# Patient Record
Sex: Female | Born: 1990 | Race: Black or African American | Hispanic: No | Marital: Single | Smoking: Former smoker
Health system: Southern US, Community
[De-identification: ages and names within clinical notes are randomized; demographics above are authoritative.]

## PROBLEM LIST (undated history)

## (undated) ENCOUNTER — Inpatient Hospital Stay (HOSPITAL_COMMUNITY): Payer: Self-pay

## (undated) ENCOUNTER — Inpatient Hospital Stay (HOSPITAL_COMMUNITY): Payer: Medicaid Other

## (undated) DIAGNOSIS — Z8711 Personal history of peptic ulcer disease: Secondary | ICD-10-CM

## (undated) DIAGNOSIS — Z789 Other specified health status: Secondary | ICD-10-CM

## (undated) DIAGNOSIS — N856 Intrauterine synechiae: Secondary | ICD-10-CM

## (undated) DIAGNOSIS — IMO0002 Reserved for concepts with insufficient information to code with codable children: Secondary | ICD-10-CM

## (undated) DIAGNOSIS — O1490 Unspecified pre-eclampsia, unspecified trimester: Secondary | ICD-10-CM

## (undated) DIAGNOSIS — B009 Herpesviral infection, unspecified: Secondary | ICD-10-CM

## (undated) DIAGNOSIS — Z8719 Personal history of other diseases of the digestive system: Secondary | ICD-10-CM

## (undated) DIAGNOSIS — R87619 Unspecified abnormal cytological findings in specimens from cervix uteri: Secondary | ICD-10-CM

## (undated) HISTORY — PX: NO PAST SURGERIES: SHX2092

## (undated) HISTORY — PX: WISDOM TOOTH EXTRACTION: SHX21

## (undated) HISTORY — PX: TOOTH EXTRACTION: SUR596

---
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
## 2008-06-05 ENCOUNTER — Emergency Department (HOSPITAL_COMMUNITY): Admission: EM | Admit: 2008-06-05 | Discharge: 2008-06-05 | Payer: Self-pay | Admitting: *Deleted

## 2008-07-10 ENCOUNTER — Emergency Department (HOSPITAL_COMMUNITY): Admission: EM | Admit: 2008-07-10 | Discharge: 2008-07-10 | Payer: Self-pay | Admitting: Emergency Medicine

## 2008-12-04 ENCOUNTER — Emergency Department (HOSPITAL_COMMUNITY): Admission: EM | Admit: 2008-12-04 | Discharge: 2008-12-04 | Payer: Self-pay | Admitting: Emergency Medicine

## 2008-12-27 ENCOUNTER — Emergency Department (HOSPITAL_COMMUNITY): Admission: EM | Admit: 2008-12-27 | Discharge: 2008-12-28 | Payer: Self-pay | Admitting: Emergency Medicine

## 2009-01-20 ENCOUNTER — Emergency Department (HOSPITAL_COMMUNITY): Admission: EM | Admit: 2009-01-20 | Discharge: 2009-01-20 | Payer: Self-pay | Admitting: Family Medicine

## 2009-11-13 ENCOUNTER — Emergency Department (HOSPITAL_COMMUNITY): Admission: EM | Admit: 2009-11-13 | Discharge: 2009-11-13 | Payer: Self-pay | Admitting: Emergency Medicine

## 2010-04-22 ENCOUNTER — Emergency Department (HOSPITAL_COMMUNITY): Admission: EM | Admit: 2010-04-22 | Discharge: 2010-04-22 | Payer: Self-pay | Admitting: Emergency Medicine

## 2010-08-01 ENCOUNTER — Emergency Department (HOSPITAL_COMMUNITY)
Admission: EM | Admit: 2010-08-01 | Discharge: 2010-08-02 | Disposition: A | Discharge status: Home / Self Care | Payer: Self-pay | Attending: Emergency Medicine | Admitting: Emergency Medicine

## 2010-08-01 DIAGNOSIS — Z0389 Encounter for observation for other suspected diseases and conditions ruled out: Secondary | ICD-10-CM | POA: Insufficient documentation

## 2010-08-07 LAB — COMPREHENSIVE METABOLIC PANEL
ALT: 15 U/L (ref 0–35)
AST: 21 U/L (ref 0–37)
Albumin: 4 g/dL (ref 3.5–5.2)
Alkaline Phosphatase: 52 U/L (ref 39–117)
BUN: 12 mg/dL (ref 6–23)
CO2: 23 mEq/L (ref 19–32)
Calcium: 9.2 mg/dL (ref 8.4–10.5)
Chloride: 108 mEq/L (ref 96–112)
Creatinine, Ser: 0.61 mg/dL (ref 0.4–1.2)
GFR calc Af Amer: >60 mL/min (ref >60)
GFR calc non Af Amer: >60 mL/min (ref >60)
Glucose, Bld: 79 mg/dL (ref 70–99)
Potassium: 3.6 mEq/L (ref 3.5–5.1)
Sodium: 139 mEq/L (ref 135–145)
Total Bilirubin: 0.5 mg/dL (ref 0.3–1.2)
Total Protein: 7.2 g/dL (ref 6.0–8.3)

## 2010-08-07 LAB — CBC
HCT: 39.5 % (ref 36.0–46.0)
Hemoglobin: 13.3 g/dL (ref 12.0–15.0)
MCH: 29.2 pg (ref 26.0–34.0)
MCHC: 33.7 g/dL (ref 30.0–36.0)
MCV: 86.8 fL (ref 78.0–100.0)
Platelets: 270 10*3/uL (ref 150–400)
RBC: 4.55 MIL/uL (ref 3.87–5.11)
RDW: 12.3 % (ref 11.5–15.5)
WBC: 7 10*3/uL (ref 4.0–10.5)

## 2010-08-07 LAB — DIFFERENTIAL
Basophils Absolute: 0.1 10*3/uL (ref 0.0–0.1)
Basophils Relative: 2 % — ABNORMAL HIGH (ref 0–1)
Eosinophils Absolute: 0.1 10*3/uL (ref 0.0–0.7)
Eosinophils Relative: 1 % (ref 0–5)
Lymphocytes Relative: 34 % (ref 12–46)
Lymphs Abs: 2.4 10*3/uL (ref 0.7–4.0)
Monocytes Absolute: 0.5 10*3/uL (ref 0.1–1.0)
Monocytes Relative: 7 % (ref 3–12)
Neutro Abs: 3.9 10*3/uL (ref 1.7–7.7)
Neutrophils Relative %: 56 % (ref 43–77)

## 2010-08-07 LAB — LIPASE, BLOOD: Lipase: 27 U/L (ref 11–59)

## 2010-08-07 LAB — URINALYSIS, ROUTINE W REFLEX MICROSCOPIC
Bilirubin Urine: NEGATIVE
Glucose, UA: NEGATIVE mg/dL
Hgb urine dipstick: NEGATIVE
Ketones, ur: 15 mg/dL — AB
Nitrite: NEGATIVE
Protein, ur: NEGATIVE mg/dL
Specific Gravity, Urine: 1.03 (ref 1.005–1.030)
Urobilinogen, UA: 1 mg/dL (ref 0.0–1.0)
pH: 6 (ref 5.0–8.0)

## 2010-08-07 LAB — POCT PREGNANCY, URINE: Preg Test, Ur: NEGATIVE

## 2010-09-01 LAB — COMPREHENSIVE METABOLIC PANEL
ALT: 14 U/L (ref 0–35)
AST: 17 U/L (ref 0–37)
Albumin: 3.9 g/dL (ref 3.5–5.2)
Alkaline Phosphatase: 50 U/L (ref 39–117)
BUN: 10 mg/dL (ref 6–23)
CO2: 25 mEq/L (ref 19–32)
Calcium: 8.7 mg/dL (ref 8.4–10.5)
Chloride: 107 mEq/L (ref 96–112)
Creatinine, Ser: 0.63 mg/dL (ref 0.4–1.2)
GFR calc Af Amer: >60 mL/min (ref >60)
GFR calc non Af Amer: >60 mL/min (ref >60)
Glucose, Bld: 92 mg/dL (ref 70–99)
Potassium: 3.9 mEq/L (ref 3.5–5.1)
Sodium: 136 mEq/L (ref 135–145)
Total Bilirubin: 0.6 mg/dL (ref 0.3–1.2)
Total Protein: 6.5 g/dL (ref 6.0–8.3)

## 2010-09-01 LAB — DIFFERENTIAL
Basophils Absolute: 0.1 10*3/uL (ref 0.0–0.1)
Basophils Relative: 1 % (ref 0–1)
Eosinophils Absolute: 0.3 10*3/uL (ref 0.0–0.7)
Eosinophils Relative: 4 % (ref 0–5)
Lymphocytes Relative: 50 % — ABNORMAL HIGH (ref 12–46)
Lymphs Abs: 3.3 10*3/uL (ref 0.7–4.0)
Monocytes Absolute: 0.6 10*3/uL (ref 0.1–1.0)
Monocytes Relative: 9 % (ref 3–12)
Neutro Abs: 2.4 10*3/uL (ref 1.7–7.7)
Neutrophils Relative %: 36 % — ABNORMAL LOW (ref 43–77)

## 2010-09-01 LAB — POCT URINALYSIS DIP (DEVICE)
Bilirubin Urine: NEGATIVE
Glucose, UA: NEGATIVE mg/dL
Ketones, ur: NEGATIVE mg/dL
Nitrite: NEGATIVE
Protein, ur: NEGATIVE mg/dL
Specific Gravity, Urine: 1.02 (ref 1.005–1.030)
Urobilinogen, UA: 0.2 mg/dL (ref 0.0–1.0)
pH: 6.5 (ref 5.0–8.0)

## 2010-09-01 LAB — POCT I-STAT, CHEM 8
BUN: 12 mg/dL (ref 6–23)
Calcium, Ion: 1.26 mmol/L (ref 1.12–1.32)
Chloride: 102 mEq/L (ref 96–112)
Creatinine, Ser: 0.7 mg/dL (ref 0.4–1.2)
Glucose, Bld: 90 mg/dL (ref 70–99)
HCT: 41 % (ref 36.0–46.0)
Hemoglobin: 13.9 g/dL (ref 12.0–15.0)
Potassium: 3.4 mEq/L — ABNORMAL LOW (ref 3.5–5.1)
Sodium: 142 mEq/L (ref 135–145)
TCO2: 26 mmol/L (ref 0–100)

## 2010-09-01 LAB — CBC
HCT: 37.2 % (ref 36.0–46.0)
Hemoglobin: 12.4 g/dL (ref 12.0–15.0)
MCHC: 33.2 g/dL (ref 30.0–36.0)
MCV: 87.9 fL (ref 78.0–100.0)
Platelets: 241 10*3/uL (ref 150–400)
RBC: 4.24 MIL/uL (ref 3.87–5.11)
RDW: 12.1 % (ref 11.5–15.5)
WBC: 6.7 10*3/uL (ref 4.0–10.5)

## 2010-09-01 LAB — URINALYSIS, ROUTINE W REFLEX MICROSCOPIC
Bilirubin Urine: NEGATIVE
Glucose, UA: NEGATIVE mg/dL
Hgb urine dipstick: NEGATIVE
Ketones, ur: NEGATIVE mg/dL
Nitrite: NEGATIVE
Protein, ur: NEGATIVE mg/dL
Specific Gravity, Urine: 1.025 (ref 1.005–1.030)
Urobilinogen, UA: 1 mg/dL (ref 0.0–1.0)
pH: 7.5 (ref 5.0–8.0)

## 2010-09-01 LAB — POCT PREGNANCY, URINE
Preg Test, Ur: NEGATIVE
Preg Test, Ur: NEGATIVE

## 2010-09-01 LAB — LIPASE, BLOOD: Lipase: 28 U/L (ref 11–59)

## 2010-09-02 LAB — DIFFERENTIAL
Basophils Absolute: 0.1 10*3/uL (ref 0.0–0.1)
Basophils Relative: 1 % (ref 0–1)
Eosinophils Absolute: 0.2 10*3/uL (ref 0.0–0.7)
Eosinophils Relative: 5 % (ref 0–5)
Lymphocytes Relative: 36 % (ref 12–46)
Lymphs Abs: 1.7 10*3/uL (ref 0.7–4.0)
Monocytes Absolute: 0.4 10*3/uL (ref 0.1–1.0)
Monocytes Relative: 8 % (ref 3–12)
Neutro Abs: 2.4 10*3/uL (ref 1.7–7.7)
Neutrophils Relative %: 50 % (ref 43–77)

## 2010-09-02 LAB — COMPREHENSIVE METABOLIC PANEL
ALT: 17 U/L (ref 0–35)
AST: 23 U/L (ref 0–37)
Albumin: 3.5 g/dL (ref 3.5–5.2)
Alkaline Phosphatase: 49 U/L (ref 39–117)
BUN: 9 mg/dL (ref 6–23)
CO2: 25 mEq/L (ref 19–32)
Calcium: 8.9 mg/dL (ref 8.4–10.5)
Chloride: 109 mEq/L (ref 96–112)
Creatinine, Ser: 0.69 mg/dL (ref 0.4–1.2)
GFR calc Af Amer: >60 mL/min (ref >60)
GFR calc non Af Amer: >60 mL/min (ref >60)
Glucose, Bld: 110 mg/dL — ABNORMAL HIGH (ref 70–99)
Potassium: 3.5 mEq/L (ref 3.5–5.1)
Sodium: 141 mEq/L (ref 135–145)
Total Bilirubin: 0.7 mg/dL (ref 0.3–1.2)
Total Protein: 6.3 g/dL (ref 6.0–8.3)

## 2010-09-02 LAB — CBC
HCT: 38.4 % (ref 36.0–46.0)
Hemoglobin: 13 g/dL (ref 12.0–15.0)
MCHC: 33.9 g/dL (ref 30.0–36.0)
MCV: 87.1 fL (ref 78.0–100.0)
Platelets: 232 10*3/uL (ref 150–400)
RBC: 4.41 MIL/uL (ref 3.87–5.11)
RDW: 12.7 % (ref 11.5–15.5)
WBC: 4.8 10*3/uL (ref 4.0–10.5)

## 2010-09-02 LAB — HCG, SERUM, QUALITATIVE: Preg, Serum: NEGATIVE

## 2010-09-02 LAB — LIPASE, BLOOD: Lipase: 27 U/L (ref 11–59)

## 2010-09-10 LAB — URINALYSIS, ROUTINE W REFLEX MICROSCOPIC
Glucose, UA: NEGATIVE mg/dL
Ketones, ur: NEGATIVE mg/dL
Nitrite: NEGATIVE
Protein, ur: NEGATIVE mg/dL
Urobilinogen, UA: 1 mg/dL (ref 0.0–1.0)

## 2010-09-10 LAB — RAPID URINE DRUG SCREEN, HOSP PERFORMED
Benzodiazepines: NOT DETECTED
Cocaine: NOT DETECTED
Opiates: NOT DETECTED
Tetrahydrocannabinol: NOT DETECTED

## 2010-09-10 LAB — POCT PREGNANCY, URINE: Preg Test, Ur: NEGATIVE

## 2010-09-10 LAB — URINE MICROSCOPIC-ADD ON

## 2010-09-11 LAB — RAPID URINE DRUG SCREEN, HOSP PERFORMED
Cocaine: NOT DETECTED
Tetrahydrocannabinol: NOT DETECTED

## 2010-10-18 ENCOUNTER — Inpatient Hospital Stay (INDEPENDENT_AMBULATORY_CARE_PROVIDER_SITE_OTHER)
Admission: RE | Admit: 2010-10-18 | Discharge: 2010-10-18 | Disposition: A | Discharge status: Home / Self Care | Payer: Self-pay | Source: Ambulatory Visit | Attending: Emergency Medicine | Admitting: Emergency Medicine

## 2010-10-18 DIAGNOSIS — IMO0002 Reserved for concepts with insufficient information to code with codable children: Secondary | ICD-10-CM

## 2010-12-09 ENCOUNTER — Emergency Department (HOSPITAL_COMMUNITY): Payer: Self-pay

## 2010-12-09 ENCOUNTER — Emergency Department (HOSPITAL_COMMUNITY)
Admission: EM | Admit: 2010-12-09 | Discharge: 2010-12-10 | Disposition: A | Discharge status: Home / Self Care | Payer: Self-pay | Attending: Emergency Medicine | Admitting: Emergency Medicine

## 2010-12-09 DIAGNOSIS — R059 Cough, unspecified: Secondary | ICD-10-CM | POA: Insufficient documentation

## 2010-12-09 DIAGNOSIS — R05 Cough: Secondary | ICD-10-CM | POA: Insufficient documentation

## 2010-12-09 DIAGNOSIS — R079 Chest pain, unspecified: Secondary | ICD-10-CM | POA: Insufficient documentation

## 2010-12-09 DIAGNOSIS — R5381 Other malaise: Secondary | ICD-10-CM | POA: Insufficient documentation

## 2010-12-09 DIAGNOSIS — R0602 Shortness of breath: Secondary | ICD-10-CM | POA: Insufficient documentation

## 2010-12-09 DIAGNOSIS — R112 Nausea with vomiting, unspecified: Secondary | ICD-10-CM | POA: Insufficient documentation

## 2010-12-09 DIAGNOSIS — R300 Dysuria: Secondary | ICD-10-CM | POA: Insufficient documentation

## 2010-12-09 DIAGNOSIS — N390 Urinary tract infection, site not specified: Secondary | ICD-10-CM | POA: Insufficient documentation

## 2010-12-09 LAB — POCT I-STAT, CHEM 8
BUN: 13 mg/dL (ref 6–23)
Hemoglobin: 13.6 g/dL (ref 12.0–15.0)
Potassium: 4.3 mEq/L (ref 3.5–5.1)
Sodium: 140 mEq/L (ref 135–145)
TCO2: 24 mmol/L (ref 0–100)

## 2010-12-09 LAB — POCT PREGNANCY, URINE: Preg Test, Ur: NEGATIVE

## 2010-12-10 LAB — URINALYSIS, ROUTINE W REFLEX MICROSCOPIC
Ketones, ur: NEGATIVE mg/dL
Nitrite: NEGATIVE
Protein, ur: NEGATIVE mg/dL
pH: 7.5 (ref 5.0–8.0)

## 2011-03-27 ENCOUNTER — Emergency Department (HOSPITAL_COMMUNITY)
Admission: EM | Admit: 2011-03-27 | Discharge: 2011-03-27 | Disposition: A | Discharge status: Home / Self Care | Payer: Self-pay | Attending: Emergency Medicine | Admitting: Emergency Medicine

## 2011-03-27 DIAGNOSIS — B9689 Other specified bacterial agents as the cause of diseases classified elsewhere: Secondary | ICD-10-CM | POA: Insufficient documentation

## 2011-03-27 DIAGNOSIS — R109 Unspecified abdominal pain: Secondary | ICD-10-CM | POA: Insufficient documentation

## 2011-03-27 DIAGNOSIS — R10819 Abdominal tenderness, unspecified site: Secondary | ICD-10-CM | POA: Insufficient documentation

## 2011-03-27 DIAGNOSIS — N760 Acute vaginitis: Secondary | ICD-10-CM | POA: Insufficient documentation

## 2011-03-27 DIAGNOSIS — A499 Bacterial infection, unspecified: Secondary | ICD-10-CM | POA: Insufficient documentation

## 2011-03-27 LAB — WET PREP, GENITAL
Trich, Wet Prep: NONE SEEN
Yeast Wet Prep HPF POC: NONE SEEN

## 2011-03-27 LAB — URINALYSIS, ROUTINE W REFLEX MICROSCOPIC
Glucose, UA: NEGATIVE mg/dL
Hgb urine dipstick: NEGATIVE
Specific Gravity, Urine: 1.031 — ABNORMAL HIGH (ref 1.005–1.030)
pH: 6 (ref 5.0–8.0)

## 2011-03-27 LAB — POCT I-STAT, CHEM 8
Creatinine, Ser: 0.7 mg/dL (ref 0.50–1.10)
Hemoglobin: 14.3 g/dL (ref 12.0–15.0)
Sodium: 140 mEq/L (ref 135–145)
TCO2: 22 mmol/L (ref 0–100)

## 2011-03-28 LAB — GC/CHLAMYDIA PROBE AMP, GENITAL
Chlamydia, DNA Probe: NEGATIVE
GC Probe Amp, Genital: NEGATIVE

## 2011-05-10 ENCOUNTER — Inpatient Hospital Stay (HOSPITAL_COMMUNITY): Payer: Medicaid Other

## 2011-05-10 ENCOUNTER — Inpatient Hospital Stay (HOSPITAL_COMMUNITY)
Admission: AD | Admit: 2011-05-10 | Discharge: 2011-05-10 | Disposition: A | Discharge status: Home / Self Care | Payer: Medicaid Other | Source: Ambulatory Visit | Attending: Obstetrics & Gynecology | Admitting: Obstetrics & Gynecology

## 2011-05-10 ENCOUNTER — Encounter (HOSPITAL_COMMUNITY): Payer: Self-pay | Admitting: *Deleted

## 2011-05-10 DIAGNOSIS — R102 Pelvic and perineal pain: Secondary | ICD-10-CM

## 2011-05-10 DIAGNOSIS — R109 Unspecified abdominal pain: Secondary | ICD-10-CM | POA: Insufficient documentation

## 2011-05-10 DIAGNOSIS — O26899 Other specified pregnancy related conditions, unspecified trimester: Secondary | ICD-10-CM

## 2011-05-10 DIAGNOSIS — N390 Urinary tract infection, site not specified: Secondary | ICD-10-CM

## 2011-05-10 DIAGNOSIS — N949 Unspecified condition associated with female genital organs and menstrual cycle: Secondary | ICD-10-CM

## 2011-05-10 DIAGNOSIS — O2390 Unspecified genitourinary tract infection in pregnancy, unspecified trimester: Secondary | ICD-10-CM

## 2011-05-10 DIAGNOSIS — O2340 Unspecified infection of urinary tract in pregnancy, unspecified trimester: Secondary | ICD-10-CM

## 2011-05-10 LAB — WET PREP, GENITAL: Trich, Wet Prep: NONE SEEN

## 2011-05-10 LAB — URINALYSIS, ROUTINE W REFLEX MICROSCOPIC
Bilirubin Urine: NEGATIVE
Ketones, ur: NEGATIVE mg/dL
Nitrite: POSITIVE — AB
pH: 7 (ref 5.0–8.0)

## 2011-05-10 LAB — CBC
Hemoglobin: 12.1 g/dL (ref 12.0–15.0)
MCH: 29.4 pg (ref 26.0–34.0)
MCV: 85.2 fL (ref 78.0–100.0)
RBC: 4.12 MIL/uL (ref 3.87–5.11)
WBC: 8.6 10*3/uL (ref 4.0–10.5)

## 2011-05-10 LAB — HCG, QUANTITATIVE, PREGNANCY: hCG, Beta Chain, Quant, S: 63137 m[IU]/mL — ABNORMAL HIGH (ref <5)

## 2011-05-10 LAB — URINE MICROSCOPIC-ADD ON

## 2011-05-10 MED ORDER — SULFAMETHOXAZOLE-TRIMETHOPRIM 800-160 MG PO TABS: 1.0000 | ORAL_TABLET | Freq: Two times a day (BID) | ORAL | Status: AC

## 2011-05-10 MED ORDER — SULFAMETHOXAZOLE-TRIMETHOPRIM 800-160 MG PO TABS: 1.0000 | ORAL_TABLET | Freq: Two times a day (BID) | ORAL | Status: DC

## 2011-05-10 NOTE — ED Notes (Signed)
Called, not in lobby 

## 2011-05-10 NOTE — ED Provider Notes (Signed)
History     Chief Complaint  Patient presents with  . Abdominal Pain  . Emesis   HPI Traci King 20 y.o. Comes to MAU with vomiting and lower abdominal pain.  Has not started prenatal care.  No vomiting in past 4 hours.  OB History    Grav Para Term Preterm Abortions TAB SAB Ect Mult Living   1               Past Medical History  Diagnosis Date  . No pertinent past medical history     Past Surgical History  Procedure Date  . No past surgeries     No family history on file.  History  Substance Use Topics  . Smoking status: Current Everyday Smoker -- 0.2 packs/day  . Smokeless tobacco: Not on file  . Alcohol Use: No    Allergies: No Known Allergies  Prescriptions prior to admission  Medication Sig Dispense Refill  . prenatal vitamin w/FE, FA (PRENATAL 1 + 1) 27-1 MG TABS Take 1 tablet by mouth daily.          Review of Systems  Gastrointestinal: Positive for nausea, vomiting and abdominal pain.  Genitourinary: Negative for dysuria.   Physical Exam   Temperature 99 F (37.2 C), temperature source Oral, height 5' 2.5" (1.588 m), weight 126 lb 2 oz (57.21 kg), last menstrual period 03/10/2011.  Physical Exam  Nursing note and vitals reviewed. Constitutional: She is oriented to person, place, and time. She appears well-developed and well-nourished.  HENT:  Head: Normocephalic.  Eyes: EOM are normal.  Neck: Neck supple.  GI: Soft. There is no tenderness. There is no rebound and no guarding.  Genitourinary:       Speculum exam: Vulva - discharge noted Vagina - Small amount of creamy discharge, no odor Cervix - No contact bleeding Bimanual exam: Cervix closed Uterus tender, retroverted, firm Adnexa some tenderness on the right, no masses bilaterally GC/Chlam, wet prep done Chaperone present for exam.  Musculoskeletal: Normal range of motion.  Neurological: She is alert and oriented to person, place, and time.  Skin: Skin is warm and dry.    Psychiatric: She has a normal mood and affect.    MAU Course  Procedures  MDM OBSTETRIC <14 WK [place] AND TRANSVAGINAL OB US  Technique: Both transabdominal and transvaginal ultrasound  examinations were performed for complete evaluation of the  gestation as well as the maternal uterus, adnexal regions, and  pelvic cul-de-sac. Transvaginal technique was performed to assess  early pregnancy.  Comparison: None.  Intrauterine gestational sac: Visualized, normal in shape.  Yolk sac: Identified  Embryo: Identified  Cardiac Activity: Documented  Heart Rate: 116 bpm  CRL: 7.4 mm 6 w 4 d [place] EDC: 12/30/2011  Maternal uterus/adnexae:  Small amount of simple free fluid. Ovaries are normal in size,  measuring 4.2 x 2.3 x 2.1 cm on the left and 3.3 x 1.7 x 1.7 cm on  the right. A 2.8 x 2.1 cm simple cyst is noted on the left.  IMPRESSION:  Single intrauterine gestation identified with cardiac activity  documented. Estimated gestational age of [redacted] weeks 4 days by crown-  rump length.   Results for orders placed during the hospital encounter of 05/10/11 (from the past 24 hour(s))  URINALYSIS, ROUTINE W REFLEX MICROSCOPIC     Status: Abnormal   Collection Time   05/10/11  6:25 PM      Component Value Range   Color, Urine YELLOW  YELLOW  APPearance CLEAR  CLEAR    Specific Gravity, Urine 1.015  1.005 - 1.030    pH 7.0  5.0 - 8.0    Glucose, UA NEGATIVE  NEGATIVE (mg/dL)   Hgb urine dipstick NEGATIVE  NEGATIVE    Bilirubin Urine NEGATIVE  NEGATIVE    Ketones, ur NEGATIVE  NEGATIVE (mg/dL)   Protein, ur NEGATIVE  NEGATIVE (mg/dL)   Urobilinogen, UA 0.2  0.0 - 1.0 (mg/dL)   Nitrite POSITIVE (*) NEGATIVE    Leukocytes, UA SMALL (*) NEGATIVE   URINE MICROSCOPIC-ADD ON     Status: Abnormal   Collection Time   05/10/11  6:25 PM      Component Value Range   Squamous Epithelial / LPF FEW (*) RARE    WBC, UA 11-20  <3 (WBC/hpf)   RBC / HPF 3-6  <3 (RBC/hpf)   Bacteria, UA MANY (*) RARE    POCT PREGNANCY, URINE     Status: Normal   Collection Time   05/10/11  7:04 PM      Component Value Range   Preg Test, Ur POSITIVE    CBC     Status: Abnormal   Collection Time   05/10/11  7:51 PM      Component Value Range   WBC 8.6  4.0 - 10.5 (K/uL)   RBC 4.12  3.87 - 5.11 (MIL/uL)   Hemoglobin 12.1  12.0 - 15.0 (g/dL)   HCT 40.9 (*) 81.1 - 46.0 (%)   MCV 85.2  78.0 - 100.0 (fL)   MCH 29.4  26.0 - 34.0 (pg)   MCHC 34.5  30.0 - 36.0 (g/dL)   RDW 91.4  78.2 - 95.6 (%)   Platelets 229  150 - 400 (K/uL)  WET PREP, GENITAL     Status: Abnormal   Collection Time   05/10/11  8:00 PM      Component Value Range   Yeast, Wet Prep NONE SEEN  NONE SEEN    Trich, Wet Prep NONE SEEN  NONE SEEN    Clue Cells, Wet Prep FEW (*) NONE SEEN    WBC, Wet Prep HPF POC FEW (*) NONE SEEN   ABO/RH     Status: Normal   Collection Time   05/10/11  8:01 PM      Component Value Range   ABO/RH(D) O POS    HCG, QUANTITATIVE, PREGNANCY     Status: Abnormal   Collection Time   05/10/11  8:02 PM      Component Value Range   hCG, Beta Chain, Quant, S 21308 (*) <5 (mIU/mL)     Assessment and Plan  UTI in pregnancy  Plan rx septra DS one po bid x 7 days.   Begin prenatal care as soon as possible.   Sakshi Sermons 05/10/2011, 9:07 PM   Nolene Bernheim, NP 05/10/11 2242

## 2011-05-10 NOTE — Progress Notes (Signed)
Pt states she is having lower abdominal pain that started this am. No vaginal bleeding, white odorous discharge.

## 2011-05-10 NOTE — Progress Notes (Signed)
Pt states, " I woke up this morning vomiting. I am throwing up every five minutes.I went to [facility] and they sent me here."

## 2011-05-11 LAB — GC/CHLAMYDIA PROBE AMP, GENITAL: GC Probe Amp, Genital: NEGATIVE

## 2011-05-11 NOTE — ED Provider Notes (Signed)
Attestation of Attending Supervision of Advanced Practitioner: Evaluation and management procedures were performed by the PA/NP/CNM/OB Fellow under my supervision/collaboration. Chart reviewed, and agree with management and plan.  UGONNA ANYANWU, M.D. 05/11/2011 5:41 AM   

## 2011-05-13 LAB — URINE CULTURE
Culture  Setup Time: 201212151150
Special Requests: NORMAL

## 2011-05-28 NOTE — L&D Delivery Note (Signed)
Delivery Note At 5:40 PM a viable unspecified sex was delivered via Vaginal, Spontaneous Delivery (Presentation: ; Occiput Anterior).  APGAR: , ; weight .   Placenta status: Intact, Spontaneous.  Cord:  with the following complications: .  Cord pH: not done  Anesthesia: Epidural  Episiotomy: None Lacerations: None Suture Repair: 2.0 Est. Blood Loss (mL): 250  Mom to postpartum.  Baby to nursery-stable.  Ab Leaming A 11/30/2011, 5:47 PM

## 2011-05-31 LAB — OB RESULTS CONSOLE RUBELLA ANTIBODY, IGM: Rubella: IMMUNE

## 2011-06-28 ENCOUNTER — Inpatient Hospital Stay (HOSPITAL_COMMUNITY)
Admission: AD | Admit: 2011-06-28 | Discharge: 2011-06-29 | Disposition: A | Discharge status: Home / Self Care | Payer: Medicaid Other | Source: Ambulatory Visit | Attending: Obstetrics | Admitting: Obstetrics

## 2011-06-28 DIAGNOSIS — J069 Acute upper respiratory infection, unspecified: Secondary | ICD-10-CM

## 2011-06-28 DIAGNOSIS — B373 Candidiasis of vulva and vagina: Secondary | ICD-10-CM

## 2011-06-28 DIAGNOSIS — O99891 Other specified diseases and conditions complicating pregnancy: Secondary | ICD-10-CM | POA: Insufficient documentation

## 2011-06-28 LAB — URINALYSIS, ROUTINE W REFLEX MICROSCOPIC
Bilirubin Urine: NEGATIVE
Hgb urine dipstick: NEGATIVE
Ketones, ur: 15 mg/dL — AB
Nitrite: NEGATIVE
Specific Gravity, Urine: 1.02 (ref 1.005–1.030)
Urobilinogen, UA: 0.2 mg/dL (ref 0.0–1.0)
pH: 6 (ref 5.0–8.0)

## 2011-06-28 NOTE — Progress Notes (Signed)
G1 at 15wks. States having trouble breathing. Nose stopped up and was gasping for air and couldn't get breath thru mouth. Went to [facility] and saw brownish/red d/c on toilet paper. Has had upper respiratory symptoms for 3 days

## 2011-06-29 ENCOUNTER — Encounter (HOSPITAL_COMMUNITY): Payer: Self-pay | Admitting: Obstetrics and Gynecology

## 2011-06-29 LAB — WET PREP, GENITAL
Clue Cells Wet Prep HPF POC: NONE SEEN
Yeast Wet Prep HPF POC: NONE SEEN

## 2011-06-29 LAB — GC/CHLAMYDIA PROBE AMP, GENITAL: Chlamydia, DNA Probe: NEGATIVE

## 2011-06-29 MED ORDER — PSEUDOEPHEDRINE HCL 30 MG PO TABS
30.0000 mg | ORAL_TABLET | ORAL | Status: DC | PRN
Start: 2011-06-29 — End: 2011-06-29

## 2011-06-29 MED ORDER — PSEUDOEPHEDRINE HCL 30 MG PO TABS: 30.0000 mg | ORAL_TABLET | ORAL | Status: AC | PRN

## 2011-06-29 MED ORDER — TERCONAZOLE 0.4 % VA CREA: 1.0000 | TOPICAL_CREAM | Freq: Every day | VAGINAL | Status: AC

## 2011-06-29 MED ORDER — PSEUDOEPHEDRINE HCL 30 MG PO TABS
60.0000 mg | ORAL_TABLET | Freq: Once | ORAL | Status: AC
  Administered 2011-06-29: 60 mg via ORAL
  Filled 2011-06-29 (×2): qty 1

## 2011-06-29 NOTE — ED Provider Notes (Signed)
History     Chief Complaint  Patient presents with  . Vaginal Bleeding  . Abdominal Pain  . URI   HPI Traci King  21 y.o. 14w 6d gestation.  Comes to MAU tonight with congested nose, some difficulty breathing and noticing some blood when she wipes.  She awakened after sleeping with nose completely blocked and then sneezed and had congestion in her throat as well.  Thought she could not breathe but did not become dizzy or pass out.  Having some bothersome vaginal discharge.  Felt some leak out when she sneezed.   OB History    Grav Para Term Preterm Abortions TAB SAB Ect Mult Living   1               Past Medical History  Diagnosis Date  . No pertinent past medical history     Past Surgical History  Procedure Date  . No past surgeries     History reviewed. No pertinent family history.  History  Substance Use Topics  . Smoking status: Current Everyday Smoker -- 0.2 packs/day  . Smokeless tobacco: Not on file  . Alcohol Use: No    Allergies: No Known Allergies  Prescriptions prior to admission  Medication Sig Dispense Refill  . acetaminophen-codeine (TYLENOL #3) 300-30 MG per tablet Take 0.5 tablets by mouth every 4 (four) hours as needed. pain      . Ondansetron (ZOFRAN ODT PO) Take 1 tablet by mouth every 4 (four) hours as needed. Nausea; pt does not know if this ODT is 4mg  or 8mg       . Prenatal Vit-Fe Fumarate-FA (PRENATAL MULTIVITAMIN) TABS Take 1 tablet by mouth daily.        Review of Systems  HENT: Positive for congestion and sore throat.   Gastrointestinal: Negative for nausea, vomiting and abdominal pain.  Genitourinary: Negative for dysuria.       Saw blood when wiping   Physical Exam   Blood pressure 103/63, pulse 74, temperature 98.6 F (37 C), temperature source Oral, resp. rate 20, height 5\' 2"  (1.575 m), weight 131 lb (59.421 kg), last menstrual period 03/10/2011.  Physical Exam  Nursing note and vitals reviewed. Constitutional: She is  oriented to person, place, and time. She appears well-developed and well-nourished.  HENT:  Head: Normocephalic.  Eyes: EOM are normal.  Neck: Neck supple.  Cardiovascular: Normal rate and regular rhythm.   No murmur heard. Respiratory: Effort normal and breath sounds normal. No respiratory distress. She has no wheezes.  GI: Soft. There is no tenderness. There is no rebound and no guarding.  Genitourinary:       Speculum exam: Vulva - mild erythema, small fissure at 6 oclock, no blood Vagina - some erythema, Small amount of curdy adherent discharge, no odor, no blood in vagina Cervix - No contact bleeding Bimanual exam: Cervix closed Uterus non tender, 15 week size Adnexa non tender, no masses bilaterally GC/Chlam, wet prep done Chaperone present for exam.  Musculoskeletal: Normal range of motion.  Neurological: She is alert and oriented to person, place, and time.  Skin: Skin is warm and dry.  Psychiatric: She has a normal mood and affect.    MAU Course  Procedures Results for orders placed during the hospital encounter of 06/28/11 (from the past 24 hour(s))  URINALYSIS, ROUTINE W REFLEX MICROSCOPIC     Status: Abnormal   Collection Time   06/28/11 11:35 PM      Component Value Range   Color,  Urine YELLOW  YELLOW    APPearance CLEAR  CLEAR    Specific Gravity, Urine 1.020  1.005 - 1.030    pH 6.0  5.0 - 8.0    Glucose, UA NEGATIVE  NEGATIVE (mg/dL)   Hgb urine dipstick NEGATIVE  NEGATIVE    Bilirubin Urine NEGATIVE  NEGATIVE    Ketones, ur 15 (*) NEGATIVE (mg/dL)   Protein, ur NEGATIVE  NEGATIVE (mg/dL)   Urobilinogen, UA 0.2  0.0 - 1.0 (mg/dL)   Nitrite NEGATIVE  NEGATIVE    Leukocytes, UA NEGATIVE  NEGATIVE   WET PREP, GENITAL     Status: Abnormal   Collection Time   06/29/11  1:15 AM      Component Value Range   Yeast Wet Prep HPF POC NONE SEEN  NONE SEEN    Trich, Wet Prep NONE SEEN  NONE SEEN    Clue Cells Wet Prep HPF POC NONE SEEN  NONE SEEN    WBC, Wet Prep  HPF POC FEW (*) NONE SEEN     MDM Classic appearance of moderate yeast infection.  Did not show yeast on wet prep but clinical appearance is very classic for yeast.  Assessment and Plan  URI with nasal congestion 14w gestation  Plan Terazol vaginal cream to use at HS x 7 days for yeast infection Sudafed 60 mg PO every 6 hours for nasal congestion Take Tylenol 325 mg 2 tablets by mouth every 4 hours if needed for pain. Drink at least 8 8-oz glasses of water every day.   BURLESON,TERRI 06/29/2011, 1:20 AM   Nolene Bernheim, NP 06/29/11 0151

## 2011-07-22 ENCOUNTER — Encounter (HOSPITAL_COMMUNITY): Payer: Self-pay | Admitting: *Deleted

## 2011-07-22 ENCOUNTER — Inpatient Hospital Stay (HOSPITAL_COMMUNITY)
Admission: AD | Admit: 2011-07-22 | Discharge: 2011-07-22 | Disposition: A | Discharge status: Home / Self Care | Payer: Medicaid Other | Source: Ambulatory Visit | Attending: Obstetrics | Admitting: Obstetrics

## 2011-07-22 ENCOUNTER — Inpatient Hospital Stay (HOSPITAL_COMMUNITY): Payer: Medicaid Other

## 2011-07-22 DIAGNOSIS — O98519 Other viral diseases complicating pregnancy, unspecified trimester: Secondary | ICD-10-CM | POA: Insufficient documentation

## 2011-07-22 DIAGNOSIS — N949 Unspecified condition associated with female genital organs and menstrual cycle: Secondary | ICD-10-CM

## 2011-07-22 DIAGNOSIS — A6000 Herpesviral infection of urogenital system, unspecified: Secondary | ICD-10-CM | POA: Insufficient documentation

## 2011-07-22 DIAGNOSIS — B009 Herpesviral infection, unspecified: Secondary | ICD-10-CM

## 2011-07-22 DIAGNOSIS — R109 Unspecified abdominal pain: Secondary | ICD-10-CM | POA: Insufficient documentation

## 2011-07-22 HISTORY — DX: Personal history of other diseases of the digestive system: Z87.19

## 2011-07-22 HISTORY — DX: Personal history of peptic ulcer disease: Z87.11

## 2011-07-22 LAB — CBC
MCH: 29.9 pg (ref 26.0–34.0)
MCHC: 33.7 g/dL (ref 30.0–36.0)
Platelets: 243 10*3/uL (ref 150–400)
RDW: 13.3 % (ref 11.5–15.5)

## 2011-07-22 LAB — URINALYSIS, ROUTINE W REFLEX MICROSCOPIC
Bilirubin Urine: NEGATIVE
Ketones, ur: NEGATIVE mg/dL
Nitrite: NEGATIVE
Protein, ur: NEGATIVE mg/dL
Urobilinogen, UA: 0.2 mg/dL (ref 0.0–1.0)
pH: 6.5 (ref 5.0–8.0)

## 2011-07-22 LAB — URINE MICROSCOPIC-ADD ON

## 2011-07-22 LAB — WET PREP, GENITAL: Yeast Wet Prep HPF POC: NONE SEEN

## 2011-07-22 MED ORDER — ACYCLOVIR 400 MG PO TABS: 400.0000 mg | ORAL_TABLET | Freq: Three times a day (TID) | ORAL | Status: AC

## 2011-07-22 NOTE — ED Provider Notes (Signed)
History   Pt presents today c/o severe lower abd pain and vag pain since Thursday night. She states the pain has progressively worsened and she now has severe burning and pain with urination. She denies vag bleeding or fever.  Chief Complaint  Patient presents with  . Abdominal Pain   HPI  OB History    Grav Para Term Preterm Abortions TAB SAB Ect Mult Living   1               Past Medical History  Diagnosis Date  . History of stomach ulcers     Past Surgical History  Procedure Date  . No past surgeries     No family history on file.  History  Substance Use Topics  . Smoking status: Never Smoker   . Smokeless tobacco: Not on file  . Alcohol Use: No    Allergies: No Known Allergies  Prescriptions prior to admission  Medication Sig Dispense Refill  . acetaminophen-codeine (TYLENOL #3) 300-30 MG per tablet Take 0.5 tablets by mouth every 4 (four) hours as needed. pain      . Ondansetron (ZOFRAN ODT PO) Take 1 tablet by mouth every 4 (four) hours as needed. Nausea; pt does not know if this ODT is 4mg  or 8mg       . Prenatal Vit-Fe Fumarate-FA (PRENATAL MULTIVITAMIN) TABS Take 1 tablet by mouth daily.        Review of Systems  Constitutional: Negative for fever and chills.  Eyes: Negative for blurred vision and double vision.  Respiratory: Negative for cough, hemoptysis, sputum production, shortness of breath and wheezing.   Cardiovascular: Negative for chest pain and palpitations.  Gastrointestinal: Positive for abdominal pain. Negative for nausea, vomiting, diarrhea and constipation.  Genitourinary: Positive for dysuria. Negative for urgency, frequency and hematuria.  Neurological: Negative for dizziness and headaches.  Psychiatric/Behavioral: Negative for depression and suicidal ideas.   Physical Exam   Blood pressure 95/55, pulse 79, temperature 99.1 F (37.3 C), temperature source Oral, resp. rate 20, height 5' 2.5" (1.588 m), weight 137 lb (62.143 kg), last  menstrual period 03/10/2011.  Physical Exam  Nursing note and vitals reviewed. Constitutional: She is oriented to person, place, and time. She appears well-developed and well-nourished. No distress.  HENT:  Head: Normocephalic and atraumatic.  Eyes: EOM are normal. Pupils are equal, round, and reactive to light.  GI: Soft. She exhibits no distension and no mass. There is tenderness. There is no rebound and no guarding.  Genitourinary: No bleeding around the vagina. Vaginal discharge found.       Multiple ulcerative lesions noted on vaginal mucosa and labia. Cervix Cl/50/-3.  Neurological: She is alert and oriented to person, place, and time.  Skin: Skin is warm and dry. She is not diaphoretic.  Psychiatric: She has a normal mood and affect. Her behavior is normal. Judgment and thought content normal.    MAU Course  Procedures  HSV cultures done.  Wet prep and GC/Chlamydia cultures done.  Results for orders placed during the hospital encounter of 07/22/11 (from the past 72 hour(s))  URINALYSIS, ROUTINE W REFLEX MICROSCOPIC     Status: Abnormal   Collection Time   07/22/11  4:05 PM      Component Value Range Comment   Color, Urine YELLOW  YELLOW     APPearance HAZY (*) CLEAR     Specific Gravity, Urine 1.025  1.005 - 1.030     pH 6.5  5.0 - 8.0  Glucose, UA NEGATIVE  NEGATIVE (mg/dL)    Hgb urine dipstick TRACE (*) NEGATIVE     Bilirubin Urine NEGATIVE  NEGATIVE     Ketones, ur NEGATIVE  NEGATIVE (mg/dL)    Protein, ur NEGATIVE  NEGATIVE (mg/dL)    Urobilinogen, UA 0.2  0.0 - 1.0 (mg/dL)    Nitrite NEGATIVE  NEGATIVE     Leukocytes, UA TRACE (*) NEGATIVE    URINE MICROSCOPIC-ADD ON     Status: Abnormal   Collection Time   07/22/11  4:05 PM      Component Value Range Comment   Squamous Epithelial / LPF FEW (*) RARE     WBC, UA 7-10  <3 (WBC/hpf)    RBC / HPF 0-2  <3 (RBC/hpf)    Bacteria, UA FEW (*) RARE     Urine-Other MUCOUS PRESENT     WET PREP, GENITAL     Status:  Abnormal   Collection Time   07/22/11  4:30 PM      Component Value Range Comment   Yeast Wet Prep HPF POC NONE SEEN  NONE SEEN     Trich, Wet Prep NONE SEEN  NONE SEEN     Clue Cells Wet Prep HPF POC NONE SEEN  NONE SEEN     WBC, Wet Prep HPF POC FEW (*) NONE SEEN  MANY BACTERIA SEEN   Urine sent for culture.  US shows single IUP at 17.5wks with an EDC of 12/25/11. Cervical length of 3.92cm.  Assessment and Plan  Herpes: discussed with pt at length. Will tx with acyclovir. Discussed safe sex practices. Discussed diet, activity, risks, and precautions.  Round ligament pain: discussed with pt at length. Discussed diet, activity, risks, and precautions.  Clinton Gallant. Colman Birdwell III, DrHSc, MPAS, PA-C  07/22/2011, 4:39 PM   Henrietta Hoover, PA 07/22/11 1751

## 2011-07-22 NOTE — Progress Notes (Signed)
abd pain - across lower abd. Started Thurs night, comes and goes.  Vaginal pain started Fri night, very sore- hard to sit down, hurts to urinate.Marland Kitchen

## 2011-07-23 LAB — GC/CHLAMYDIA PROBE AMP, GENITAL
Chlamydia, DNA Probe: NEGATIVE
GC Probe Amp, Genital: NEGATIVE

## 2011-07-23 LAB — URINE CULTURE: Colony Count: 15000

## 2011-07-24 LAB — HERPES SIMPLEX VIRUS CULTURE

## 2011-08-07 ENCOUNTER — Encounter (HOSPITAL_COMMUNITY): Payer: Self-pay | Admitting: *Deleted

## 2011-08-07 ENCOUNTER — Inpatient Hospital Stay (HOSPITAL_COMMUNITY)
Admission: AD | Admit: 2011-08-07 | Discharge: 2011-08-07 | Disposition: A | Discharge status: Home / Self Care | Payer: Medicaid Other | Source: Ambulatory Visit | Attending: Obstetrics | Admitting: Obstetrics

## 2011-08-07 DIAGNOSIS — K5289 Other specified noninfective gastroenteritis and colitis: Secondary | ICD-10-CM | POA: Insufficient documentation

## 2011-08-07 DIAGNOSIS — R112 Nausea with vomiting, unspecified: Secondary | ICD-10-CM | POA: Insufficient documentation

## 2011-08-07 DIAGNOSIS — K529 Noninfective gastroenteritis and colitis, unspecified: Secondary | ICD-10-CM

## 2011-08-07 DIAGNOSIS — R197 Diarrhea, unspecified: Secondary | ICD-10-CM | POA: Insufficient documentation

## 2011-08-07 LAB — COMPREHENSIVE METABOLIC PANEL
ALT: 34 U/L (ref 0–35)
AST: 23 U/L (ref 0–37)
Alkaline Phosphatase: 46 U/L (ref 39–117)
CO2: 25 mEq/L (ref 19–32)
Calcium: 9.1 mg/dL (ref 8.4–10.5)
Chloride: 103 mEq/L (ref 96–112)
GFR calc Af Amer: >90 mL/min (ref >90)
GFR calc non Af Amer: >90 mL/min (ref >90)
Glucose, Bld: 83 mg/dL (ref 70–99)
Potassium: 3.9 mEq/L (ref 3.5–5.1)
Sodium: 134 mEq/L — ABNORMAL LOW (ref 135–145)

## 2011-08-07 LAB — CBC
Hemoglobin: 10.8 g/dL — ABNORMAL LOW (ref 12.0–15.0)
MCH: 29.6 pg (ref 26.0–34.0)
Platelets: 229 10*3/uL (ref 150–400)
RBC: 3.65 MIL/uL — ABNORMAL LOW (ref 3.87–5.11)
WBC: 6.7 10*3/uL (ref 4.0–10.5)

## 2011-08-07 LAB — URINALYSIS, ROUTINE W REFLEX MICROSCOPIC
Leukocytes, UA: NEGATIVE
Nitrite: NEGATIVE
Protein, ur: NEGATIVE mg/dL
Specific Gravity, Urine: <1.005 — ABNORMAL LOW (ref 1.005–1.030)

## 2011-08-07 MED ORDER — PROMETHAZINE HCL 25 MG/ML IJ SOLN
25.0000 mg | Freq: Once | INTRAMUSCULAR | Status: AC
  Administered 2011-08-07: 25 mg via INTRAVENOUS
  Filled 2011-08-07: qty 1

## 2011-08-07 MED ORDER — PROMETHAZINE HCL 25 MG PO TABS: 25.0000 mg | ORAL_TABLET | Freq: Four times a day (QID) | ORAL | Status: AC | PRN

## 2011-08-07 MED ORDER — LACTATED RINGERS IV BOLUS (SEPSIS)
1000.0000 mL | Freq: Once | INTRAVENOUS | Status: AC
  Administered 2011-08-07: 1000 mL via INTRAVENOUS

## 2011-08-07 MED ORDER — LACTATED RINGERS IV BOLUS (SEPSIS): 1000.0000 mL | Freq: Once | INTRAVENOUS | Status: DC

## 2011-08-07 NOTE — MAU Provider Note (Signed)
History   Pt presents today c/o severe N&V and diarrhea since yesterday. She states she can no longer keep anything on her stomach. She denies fever, lower abd pain, vag dc, bleeding, or any other problems at this time.  CSN: [id number]  Arrival date and time: 08/07/11 1448   None     No chief complaint on file.  HPI  OB History    Grav Para Term Preterm Abortions TAB SAB Ect Mult Living   1 0 0 0 0 0 0 0 0 0       Past Medical History  Diagnosis Date  . History of stomach ulcers     Past Surgical History  Procedure Date  . No past surgeries     No family history on file.  History  Substance Use Topics  . Smoking status: Never Smoker   . Smokeless tobacco: Not on file  . Alcohol Use: No    Allergies: No Known Allergies  Prescriptions prior to admission  Medication Sig Dispense Refill  . acetaminophen (TYLENOL) 500 MG tablet Take 1,000 mg by mouth every 6 (six) hours as needed. For pain.      . Prenatal Vit-Fe Fumarate-FA (PRENATAL MULTIVITAMIN) TABS Take 1 tablet by mouth daily.        Review of Systems  Constitutional: Positive for malaise/fatigue. Negative for fever and chills.  Eyes: Negative for blurred vision and double vision.  Respiratory: Positive for cough. Negative for hemoptysis, sputum production, shortness of breath and wheezing.   Cardiovascular: Negative for chest pain and palpitations.  Gastrointestinal: Positive for nausea, vomiting, abdominal pain and diarrhea. Negative for constipation and blood in stool.  Genitourinary: Negative for dysuria, urgency, frequency, hematuria and flank pain.  Neurological: Positive for dizziness, weakness and headaches.  Psychiatric/Behavioral: Negative for depression and suicidal ideas.   Physical Exam   Height 5\' 3"  (1.6 m), weight 138 lb 12.8 oz (62.959 kg), last menstrual period 03/10/2011.  Physical Exam  Nursing note and vitals reviewed. Constitutional: She is oriented to person, place, and time. She  appears well-developed and well-nourished. No distress.  HENT:  Head: Normocephalic and atraumatic.  Eyes: EOM are normal. Pupils are equal, round, and reactive to light.  Cardiovascular: Normal rate, regular rhythm and normal heart sounds.  Exam reveals no gallop and no friction rub.   No murmur heard. Respiratory: Effort normal and breath sounds normal. No respiratory distress. She has no wheezes. She has no rales. She exhibits no tenderness.  GI: Soft. She exhibits no distension and no mass. There is tenderness (Mild epigastric tenderness to palpation.). There is no rebound and no guarding.  Neurological: She is alert and oriented to person, place, and time.  Skin: Skin is warm and dry. She is not diaphoretic.  Psychiatric: She has a normal mood and affect. Her behavior is normal. Judgment and thought content normal.    MAU Course  Procedures  Results for orders placed during the hospital encounter of 08/07/11 (from the past 24 hour(s))  URINALYSIS, ROUTINE W REFLEX MICROSCOPIC     Status: Abnormal   Collection Time   08/07/11  3:00 PM      Component Value Range   Color, Urine YELLOW  YELLOW    APPearance CLEAR  CLEAR    Specific Gravity, Urine <1.005 (*) 1.005 - 1.030    pH 6.0  5.0 - 8.0    Glucose, UA NEGATIVE  NEGATIVE (mg/dL)   Hgb urine dipstick NEGATIVE  NEGATIVE    Bilirubin Urine NEGATIVE  NEGATIVE    Ketones, ur NEGATIVE  NEGATIVE (mg/dL)   Protein, ur NEGATIVE  NEGATIVE (mg/dL)   Urobilinogen, UA 0.2  0.0 - 1.0 (mg/dL)   Nitrite NEGATIVE  NEGATIVE    Leukocytes, UA NEGATIVE  NEGATIVE   CBC     Status: Abnormal   Collection Time   08/07/11  3:50 PM      Component Value Range   WBC 6.7  4.0 - 10.5 (K/uL)   RBC 3.65 (*) 3.87 - 5.11 (MIL/uL)   Hemoglobin 10.8 (*) 12.0 - 15.0 (g/dL)   HCT 91.4 (*) 78.2 - 46.0 (%)   MCV 88.2  78.0 - 100.0 (fL)   MCH 29.6  26.0 - 34.0 (pg)   MCHC 33.5  30.0 - 36.0 (g/dL)   RDW 95.6  21.3 - 08.6 (%)   Platelets 229  150 - 400 (K/uL)    COMPREHENSIVE METABOLIC PANEL     Status: Abnormal   Collection Time   08/07/11  3:50 PM      Component Value Range   Sodium 134 (*) 135 - 145 (mEq/L)   Potassium 3.9  3.5 - 5.1 (mEq/L)   Chloride 103  96 - 112 (mEq/L)   CO2 25  19 - 32 (mEq/L)   Glucose, Bld 83  70 - 99 (mg/dL)   BUN 7  6 - 23 (mg/dL)   Creatinine, Ser 5.78 (*) 0.50 - 1.10 (mg/dL)   Calcium 9.1  8.4 - 46.9 (mg/dL)   Total Protein 6.3  6.0 - 8.3 (g/dL)   Albumin 3.0 (*) 3.5 - 5.2 (g/dL)   AST 23  0 - 37 (U/L)   ALT 34  0 - 35 (U/L)   Alkaline Phosphatase 46  39 - 117 (U/L)   Total Bilirubin 0.2 (*) 0.3 - 1.2 (mg/dL)   GFR calc non Af Amer >90  >90 (mL/min)   GFR calc Af Amer >90  >90 (mL/min)   Pt sx have resolved following IV hydration and antiemetics.  Assessment and Plan  Gastroenteritis: discussed with pt at length. She may use Imodium for diarrhea. Will give Rx for phenergan. She will f/u with her OB provider. Discussed diet, activity, risks, and precautions.  Clinton Gallant. Christon Parada III, DrHSc, MPAS, PA-C  08/07/2011, 3:33 PM

## 2011-08-07 NOTE — Discharge Instructions (Signed)
Viral Gastroenteritis Viral gastroenteritis is also known as stomach flu. This condition affects the stomach and intestinal tract. It can cause sudden diarrhea and vomiting. The illness typically lasts 3 to 8 days. Most people develop an immune response that eventually gets rid of the virus. While this natural response develops, the virus can make you quite ill. CAUSES  Many different viruses can cause gastroenteritis, such as rotavirus or noroviruses. You can catch one of these viruses by consuming contaminated food or water. You may also catch a virus by sharing utensils or other personal items with an infected person or by touching a contaminated surface. SYMPTOMS  The most common symptoms are diarrhea and vomiting. These problems can cause a severe loss of body fluids (dehydration) and a body salt (electrolyte) imbalance. Other symptoms may include:  Fever.   Headache.   Fatigue.   Abdominal pain.  DIAGNOSIS  Your caregiver can usually diagnose viral gastroenteritis based on your symptoms and a physical exam. A stool sample may also be taken to test for the presence of viruses or other infections. TREATMENT  This illness typically goes away on its own. Treatments are aimed at rehydration. The most serious cases of viral gastroenteritis involve vomiting so severely that you are not able to keep fluids down. In these cases, fluids must be given through an intravenous line (IV). HOME CARE INSTRUCTIONS   Drink enough fluids to keep your urine clear or pale yellow. Drink small amounts of fluids frequently and increase the amounts as tolerated.   Ask your caregiver for specific rehydration instructions.   Avoid:   Foods high in sugar.   Alcohol.   Carbonated drinks.   Tobacco.   Juice.   Caffeine drinks.   Extremely hot or cold fluids.   Fatty, greasy foods.   Too much intake of anything at one time.   Dairy products until 24 to 48 hours after diarrhea stops.   You may  consume probiotics. Probiotics are active cultures of beneficial bacteria. They may lessen the amount and number of diarrheal stools in adults. Probiotics can be found in yogurt with active cultures and in supplements.   Wash your hands well to avoid spreading the virus.   Only take over-the-counter or prescription medicines for pain, discomfort, or fever as directed by your caregiver. Do not give aspirin to children. Antidiarrheal medicines are not recommended.   Ask your caregiver if you should continue to take your regular prescribed and over-the-counter medicines.   Keep all follow-up appointments as directed by your caregiver.  SEEK IMMEDIATE MEDICAL CARE IF:   You are unable to keep fluids down.   You do not urinate at least once every 6 to 8 hours.   You develop shortness of breath.   You notice blood in your stool or vomit. This may look like coffee grounds.   You have abdominal pain that increases or is concentrated in one small area (localized).   You have persistent vomiting or diarrhea.   You have a fever.   The patient is a child younger than 3 months, and he or she has a fever.   The patient is a child older than 3 months, and he or she has a fever and persistent symptoms.   The patient is a child older than 3 months, and he or she has a fever and symptoms suddenly get worse.   The patient is a baby, and he or she has no tears when crying.  MAKE SURE YOU:     Understand these instructions.   Will watch your condition.   Will get help right away if you are not doing well or get worse.  Document Released: 05/13/2005 Document Revised: 05/02/2011 Document Reviewed: 02/27/2011 Exit[employer] Patient Information 2012 [employer]. 

## 2011-08-08 NOTE — MAU Provider Note (Signed)
Attestation of Attending Supervision of Advanced Practitioner: Evaluation and management procedures were performed by the PA/NP/CNM/OB Fellow under my supervision/collaboration. Chart reviewed, and agree with management and plan.  Jaynie Collins, M.D. 08/08/2011 9:04 AM

## 2011-09-05 ENCOUNTER — Encounter (HOSPITAL_COMMUNITY): Payer: Self-pay | Admitting: *Deleted

## 2011-09-05 ENCOUNTER — Inpatient Hospital Stay (HOSPITAL_COMMUNITY): Payer: Medicaid Other

## 2011-09-05 ENCOUNTER — Inpatient Hospital Stay (HOSPITAL_COMMUNITY)
Admission: AD | Admit: 2011-09-05 | Discharge: 2011-09-05 | Disposition: A | Discharge status: Home / Self Care | Payer: Medicaid Other | Source: Ambulatory Visit | Attending: Obstetrics | Admitting: Obstetrics

## 2011-09-05 DIAGNOSIS — O2650 Maternal hypotension syndrome, unspecified trimester: Secondary | ICD-10-CM | POA: Insufficient documentation

## 2011-09-05 DIAGNOSIS — R55 Syncope and collapse: Secondary | ICD-10-CM

## 2011-09-05 LAB — URINALYSIS, ROUTINE W REFLEX MICROSCOPIC
Bilirubin Urine: NEGATIVE
Ketones, ur: NEGATIVE mg/dL
Nitrite: NEGATIVE
Protein, ur: NEGATIVE mg/dL
Specific Gravity, Urine: 1.01 (ref 1.005–1.030)
Urobilinogen, UA: 0.2 mg/dL (ref 0.0–1.0)

## 2011-09-05 LAB — URINE MICROSCOPIC-ADD ON

## 2011-09-05 LAB — GLUCOSE, CAPILLARY: Glucose-Capillary: 94 mg/dL (ref 70–99)

## 2011-09-05 MED ORDER — ONDANSETRON 8 MG PO TBDP
8.0000 mg | ORAL_TABLET | Freq: Once | ORAL | Status: AC
  Administered 2011-09-05: 8 mg via ORAL
  Filled 2011-09-05: qty 1

## 2011-09-05 NOTE — MAU Note (Addendum)
Pt reports she was at [facility] and she felt light headed and dizzy. She vomited and then "passed out". EMS brought her  MAU. Pt also reporting having intermittent abd pain

## 2011-09-05 NOTE — MAU Provider Note (Signed)
History     CSN: [id number]  Arrival date and time: 09/05/11 1105   First Provider Initiated Contact with Patient 09/05/11 1140      Chief Complaint  Patient presents with  . Loss of Consciousness   HPI Traci King is 21 y.o. G1P0000 [redacted]w[redacted]d weeks presenting with a syncopal episode at [facility] today at 10:00.  Brought in by EMS.  Patient of Dr. Elsie King last seen last month, has appt for 4/23. Began with abdominal pain when she woke up this am that eased off.  Went to work.  Ate breakfast at [employer], bacon, egg and cheese.  Ran errand for work and when in [place] began with blurred vision, dizzy and fainted.  States she landed on her left thigh.  Denies direct injury to abdomen.  Saw brown and white discharge in panties a few minutes ago when wiping.  Decreased fetal movement since fall.   "Normally she would be up by now".    Past Medical History  Diagnosis Date  . History of stomach ulcers     Past Surgical History  Procedure Date  . No past surgeries     Family History  Problem Relation Age of Onset  . Hypertension Mother   . Diabetes Mother     History  Substance Use Topics  . Smoking status: Former Smoker -- 0.2 packs/day    Quit date: 04/09/2011  . Smokeless tobacco: Not on file  . Alcohol Use: No    Allergies: No Known Allergies  Prescriptions prior to admission  Medication Sig Dispense Refill  . IRON PO Take 1 tablet by mouth every morning.      . Prenatal Vit-Fe Fumarate-FA (PRENATAL MULTIVITAMIN) TABS Take 1 tablet by mouth daily.        Review of Systems  Gastrointestinal: Positive for abdominal pain.  Genitourinary:       + brown discharge when wiping seen after admission   Physical Exam   Blood pressure 110/70, pulse 101, temperature 98.2 F (36.8 C), temperature source Oral, resp. rate 18, weight 64.864 kg (143 lb), last menstrual period 03/10/2011.  Physical Exam  Constitutional: She is oriented to person, place, and time. She appears  well-developed and well-nourished. No distress.  HENT:  Head: Normocephalic.  Neck: Normal range of motion.  Respiratory: Effort normal.  GI: Soft. There is tenderness (mild tenderness over left abdomen).  Genitourinary: Uterus is enlarged (gravid) and tender (mild). No bleeding around the vagina. Vaginal discharge (small amount of frothy discharge) found.  Neurological: She is alert and oriented to person, place, and time.  Skin: Skin is warm and dry.  Psychiatric: She has a normal mood and affect. Her behavior is normal.   Results for orders placed during the hospital encounter of 09/05/11 (from the past 24 hour(s))  GLUCOSE, CAPILLARY     Status: Normal   Collection Time   09/05/11 11:12 AM      Component Value Range   Glucose-Capillary 94  70 - 99 (mg/dL)  URINALYSIS, ROUTINE W REFLEX MICROSCOPIC     Status: Abnormal   Collection Time   09/05/11 11:45 AM      Component Value Range   Color, Urine YELLOW  YELLOW    APPearance HAZY (*) CLEAR    Specific Gravity, Urine 1.010  1.005 - 1.030    pH 8.5 (*) 5.0 - 8.0    Glucose, UA NEGATIVE  NEGATIVE (mg/dL)   Hgb urine dipstick TRACE (*) NEGATIVE    Bilirubin Urine NEGATIVE  NEGATIVE    Ketones, ur NEGATIVE  NEGATIVE (mg/dL)   Protein, ur NEGATIVE  NEGATIVE (mg/dL)   Urobilinogen, UA 0.2  0.0 - 1.0 (mg/dL)   Nitrite NEGATIVE  NEGATIVE    Leukocytes, UA LARGE (*) NEGATIVE   URINE MICROSCOPIC-ADD ON     Status: Abnormal   Collection Time   09/05/11 11:45 AM      Component Value Range   Squamous Epithelial / LPF MANY (*) RARE    WBC, UA 11-20  <3 (WBC/hpf)   RBC / HPF 0-2  <3 (RBC/hpf)   Bacteria, UA MANY (*) RARE    Urine-Other MUCOUS PRESENT     ULTRASOUND REPORT:  FHR 160.  Placental-anterior, above the os.  No abruption or previa seen.  Cx Length 3.8cm.  [redacted]w[redacted]d gestation.  Impression:  No marginal or retroplacental bleeding seen.  Nl cervical length and normal amniotic fluid volume.   MAU Course  Procedures Urine culture to  lab MDM   13:30  Reported patient's hx, physical exam to Dr. Gaynell Face.   Order given for 4 hours of monitoring and U/S to evaluate. 16:20 Patient reported to the Nurse she was nauseated.  Zofran 8mg  ODT ordered   FMS:  FHR 150, reassuring with 10X10 accels.  Contractions not noted.  16:45  Reported ultrasound report and FMS results to Dr. Gaynell Face.  Order given to discharge her to home.   Assessment and Plan  A:  Syncopal episode with patient falling on left thigh at [redacted]w[redacted]d gestation      No evidence of placental abruption on ultrasound       Neg for vaginal bleeding  P:  Patient instructed to rest today.  May return to normal activities tomorrow.  To call Dr. Gaynell Face for abdominal pain, vaginal bleeding or leaking of fluid.    Traci King,EVE M 09/05/2011, 11:52 AM

## 2011-09-05 NOTE — Discharge Instructions (Signed)
Near-Syncope Near-syncope is sudden weakness, dizziness, or feeling like you might pass out (faint). This may occur when getting up after sitting or while standing for a long period of time. Near-syncope can be caused by a drop in blood pressure. This is a common reaction, but it may occur to a greater degree in people taking medicines to control their blood pressure. Fainting often occurs when the blood pressure or pulse is too low to provide enough blood flow to the brain to keep you conscious. Fainting and near-syncope are not usually due to serious medical problems. However, certain people should be more cautious in the event of near-syncope, including elderly patients, patients with diabetes, and patients with a history of heart conditions (especially irregular rhythms).  CAUSES   Drop in blood pressure.   Physical pain.   Dehydration.   Heat exhaustion.   Emotional distress.   Low blood sugar.   Internal bleeding.   Heart and circulatory problems.   Infections.  SYMPTOMS   Dizziness.   Feeling sick to your stomach (nauseous).   Nearly fainting.   Body numbness.   Turning pale.   Tunnel vision.   Weakness.  HOME CARE INSTRUCTIONS   Lie down right away if you start feeling like you might faint. Breathe deeply and steadily. Wait until all the symptoms have passed. Most of these episodes last only a few minutes. You may feel tired for several hours.   Drink enough fluids to keep your urine clear or pale yellow.   If you are taking blood pressure or heart medicine, get up slowly, taking several minutes to sit and then stand. This can reduce dizziness that is caused by a drop in blood pressure.  SEEK IMMEDIATE MEDICAL CARE IF:   You have a severe headache.   Unusual pain develops in the chest, abdomen, or back.   There is bleeding from the mouth or rectum, or you have black or tarry stool.   An irregular heartbeat or a very rapid pulse develops.   You have  repeated fainting or seizure-like jerking during an episode.   You faint when sitting or lying down.   You develop confusion.   You have difficulty walking.   Severe weakness develops.   Vision problems develop.  MAKE SURE YOU:   Understand these instructions.   Will watch your condition.   Will get help right away if you are not doing well or get worse.  Document Released: 05/13/2005 Document Revised: 05/02/2011 Document Reviewed: 06/29/2010 Exit[employer] Patient Information 2012 [employer]. 

## 2011-09-07 LAB — URINE CULTURE
Colony Count: 70000
Culture  Setup Time: 201304120121

## 2011-09-15 ENCOUNTER — Encounter (HOSPITAL_COMMUNITY): Payer: Self-pay

## 2011-09-15 ENCOUNTER — Inpatient Hospital Stay (HOSPITAL_COMMUNITY)
Admission: AD | Admit: 2011-09-15 | Discharge: 2011-09-15 | Disposition: A | Discharge status: Home / Self Care | Payer: Medicaid Other | Source: Ambulatory Visit | Attending: Obstetrics | Admitting: Obstetrics

## 2011-09-15 DIAGNOSIS — L02229 Furuncle of trunk, unspecified: Secondary | ICD-10-CM | POA: Insufficient documentation

## 2011-09-15 DIAGNOSIS — O99891 Other specified diseases and conditions complicating pregnancy: Secondary | ICD-10-CM | POA: Insufficient documentation

## 2011-09-15 DIAGNOSIS — L02239 Carbuncle of trunk, unspecified: Secondary | ICD-10-CM | POA: Insufficient documentation

## 2011-09-15 DIAGNOSIS — Z3480 Encounter for supervision of other normal pregnancy, unspecified trimester: Secondary | ICD-10-CM

## 2011-09-15 MED ORDER — CEPHALEXIN 500 MG PO CAPS: 500.0000 mg | ORAL_CAPSULE | Freq: Four times a day (QID) | ORAL | Status: AC

## 2011-09-15 NOTE — MAU Note (Signed)
Patient presents with c/o boil on her shoulder x 1 week can hardly move her arm, patient is 26w

## 2011-09-15 NOTE — Discharge Instructions (Signed)

## 2011-09-15 NOTE — MAU Note (Signed)
Patient has a reddened raised area on left shoulder blade which has been present for about 1 1/2 weeks

## 2011-09-15 NOTE — MAU Provider Note (Signed)
Chief Complaint:  Recurrent Skin Infections   First Provider Initiated Contact with Patient 09/15/11 1107      HPI  Traci King is  21 y.o. G1P0000 at [redacted]w[redacted]d presents with one week history of a boil on her back. She's tried hot soaks and the pain is worsening and now extends to her left shoulder especially with arm movement. No drainage. Denies other lesions. No fevers. Denies contractions, leakage of fluid or vaginal bleeding. Good fetal movement.   Pregnancy Course: uncomplicated  Past Medical History: Past Medical History  Diagnosis Date  . History of stomach ulcers     Past Surgical History: Past Surgical History  Procedure Date  . No past surgeries     Family History: Family History  Problem Relation Age of Onset  . Hypertension Mother   . Diabetes Mother     Social History: History  Substance Use Topics  . Smoking status: Former Smoker -- 0.2 packs/day    Quit date: 04/09/2011  . Smokeless tobacco: Not on file  . Alcohol Use: No    Allergies: No Known Allergies  Meds:  Prescriptions prior to admission  Medication Sig Dispense Refill  . acetaminophen (TYLENOL) 500 MG tablet Take 1,000 mg by mouth every 6 (six) hours as needed. Takes for  pain      . IRON PO Take 1 tablet by mouth every morning.      . Prenatal Vit-Fe Fumarate-FA (PRENATAL MULTIVITAMIN) TABS Take 1 tablet by mouth daily.             Physical Exam  Blood pressure 109/71, pulse 94, temperature 98.4 F (36.9 C), temperature source Oral, resp. rate 16, last menstrual period 03/10/2011. GENERAL: Well-developed, well-nourished female in no acute distress.  ABDOMEN: Soft, nontender, nondistended, gravid.  EXTREMITIES: Nontender, no edema, 2+ distal pulses. SKIN: 2 cm red raised lesion left suprascapular, no drainage, no surrounding erythema or streaking. No left axillary lympadenopathy   FHT:  Baseline 150 , moderate variability, accelerations present, no decelerations Contractions:  none  Assessment: G1 at [redacted]w[redacted]d Cat 1 FHR Furuncle on back  Plan: Discussed with Dr. Gaynell Face Continue local hot soaks 2 lesion Rx Keflex Keep scheduled prenatal care appointment          Tommie Dejoseph 4/21/201311:14 AM

## 2011-09-15 NOTE — MAU Note (Signed)
Patient's discharge was ordered incorrectly.

## 2011-11-29 ENCOUNTER — Inpatient Hospital Stay (HOSPITAL_COMMUNITY)
Admission: AD | Admit: 2011-11-29 | Discharge: 2011-12-02 | DRG: 774 | Disposition: A | Discharge status: Home / Self Care | Payer: Medicaid Other | Source: Ambulatory Visit | Attending: Obstetrics | Admitting: Obstetrics

## 2011-11-29 ENCOUNTER — Encounter (HOSPITAL_COMMUNITY): Payer: Self-pay | Admitting: *Deleted

## 2011-11-29 DIAGNOSIS — O99892 Other specified diseases and conditions complicating childbirth: Principal | ICD-10-CM | POA: Diagnosis present

## 2011-11-29 DIAGNOSIS — O98519 Other viral diseases complicating pregnancy, unspecified trimester: Secondary | ICD-10-CM | POA: Diagnosis present

## 2011-11-29 DIAGNOSIS — Z22330 Carrier of Group B streptococcus: Secondary | ICD-10-CM

## 2011-11-29 DIAGNOSIS — A6000 Herpesviral infection of urogenital system, unspecified: Secondary | ICD-10-CM | POA: Diagnosis present

## 2011-11-29 HISTORY — DX: Herpesviral infection, unspecified: B00.9

## 2011-11-29 HISTORY — DX: Unspecified abnormal cytological findings in specimens from cervix uteri: R87.619

## 2011-11-29 HISTORY — DX: Reserved for concepts with insufficient information to code with codable children: IMO0002

## 2011-11-29 LAB — OB RESULTS CONSOLE HEPATITIS B SURFACE ANTIGEN: Hepatitis B Surface Ag: NEGATIVE

## 2011-11-29 LAB — CBC
HCT: 35.1 % — ABNORMAL LOW (ref 36.0–46.0)
Hemoglobin: 12 g/dL (ref 12.0–15.0)
MCH: 29.8 pg (ref 26.0–34.0)
MCHC: 34.2 g/dL (ref 30.0–36.0)

## 2011-11-29 LAB — OB RESULTS CONSOLE ANTIBODY SCREEN: Antibody Screen: NEGATIVE

## 2011-11-29 MED ORDER — DIPHENHYDRAMINE HCL 50 MG/ML IJ SOLN: 12.5000 mg | INTRAMUSCULAR | Status: DC | PRN

## 2011-11-29 MED ORDER — PHENYLEPHRINE 40 MCG/ML (10ML) SYRINGE FOR IV PUSH (FOR BLOOD PRESSURE SUPPORT): 80.0000 ug | PREFILLED_SYRINGE | INTRAVENOUS | Status: DC | PRN

## 2011-11-29 MED ORDER — IBUPROFEN 600 MG PO TABS: 600.0000 mg | ORAL_TABLET | Freq: Four times a day (QID) | ORAL | Status: DC | PRN

## 2011-11-29 MED ORDER — EPHEDRINE 5 MG/ML INJ: 10.0000 mg | INTRAVENOUS | Status: DC | PRN

## 2011-11-29 MED ORDER — PENICILLIN G POTASSIUM 5000000 UNITS IJ SOLR
2.5000 10*6.[IU] | INTRAVENOUS | Status: DC
  Administered 2011-11-29 – 2011-11-30 (×5): 2.5 10*6.[IU] via INTRAVENOUS
  Filled 2011-11-29 (×8): qty 2.5

## 2011-11-29 MED ORDER — ONDANSETRON HCL 4 MG/2ML IJ SOLN: 4.0000 mg | Freq: Four times a day (QID) | INTRAMUSCULAR | Status: DC | PRN

## 2011-11-29 MED ORDER — OXYTOCIN 40 UNITS IN LACTATED RINGERS INFUSION - SIMPLE MED: 62.5000 mL/h | Freq: Once | INTRAVENOUS | Status: DC

## 2011-11-29 MED ORDER — LACTATED RINGERS IV SOLN
500.0000 mL | INTRAVENOUS | Status: DC | PRN
  Administered 2011-11-30: 500 mL via INTRAVENOUS

## 2011-11-29 MED ORDER — LACTATED RINGERS IV SOLN: 500.0000 mL | Freq: Once | INTRAVENOUS | Status: DC

## 2011-11-29 MED ORDER — FLEET ENEMA 7-19 GM/118ML RE ENEM: 1.0000 | ENEMA | RECTAL | Status: DC | PRN

## 2011-11-29 MED ORDER — LIDOCAINE HCL (PF) 1 % IJ SOLN
30.0000 mL | INTRAMUSCULAR | Status: DC | PRN
  Filled 2011-11-29: qty 30

## 2011-11-29 MED ORDER — CITRIC ACID-SODIUM CITRATE 334-500 MG/5ML PO SOLN: 30.0000 mL | ORAL | Status: DC | PRN

## 2011-11-29 MED ORDER — OXYCODONE-ACETAMINOPHEN 5-325 MG PO TABS
1.0000 | ORAL_TABLET | ORAL | Status: DC | PRN
  Administered 2011-12-01 – 2011-12-02 (×3): 1 via ORAL
  Filled 2011-11-29 (×2): qty 1

## 2011-11-29 MED ORDER — LACTATED RINGERS IV BOLUS (SEPSIS)
1000.0000 mL | Freq: Once | INTRAVENOUS | Status: AC
  Administered 2011-11-29: 1000 mL via INTRAVENOUS

## 2011-11-29 MED ORDER — PHENYLEPHRINE 40 MCG/ML (10ML) SYRINGE FOR IV PUSH (FOR BLOOD PRESSURE SUPPORT)
80.0000 ug | PREFILLED_SYRINGE | INTRAVENOUS | Status: DC | PRN
  Filled 2011-11-29: qty 5

## 2011-11-29 MED ORDER — PENICILLIN G POTASSIUM 5000000 UNITS IJ SOLR
5.0000 10*6.[IU] | Freq: Once | INTRAVENOUS | Status: AC
  Administered 2011-11-29: 5 10*6.[IU] via INTRAVENOUS
  Filled 2011-11-29: qty 5

## 2011-11-29 MED ORDER — FENTANYL 2.5 MCG/ML BUPIVACAINE 1/10 % EPIDURAL INFUSION (WH - ANES)
14.0000 mL/h | INTRAMUSCULAR | Status: DC
Start: 2011-11-29 — End: 2011-11-30
  Administered 2011-11-30: 14 mL/h via EPIDURAL
  Filled 2011-11-29 (×2): qty 60

## 2011-11-29 MED ORDER — FENTANYL 2.5 MCG/ML BUPIVACAINE 1/10 % EPIDURAL INFUSION (WH - ANES): 14.0000 mL/h | INTRAMUSCULAR | Status: DC

## 2011-11-29 MED ORDER — ACETAMINOPHEN 325 MG PO TABS: 650.0000 mg | ORAL_TABLET | ORAL | Status: DC | PRN

## 2011-11-29 MED ORDER — EPHEDRINE 5 MG/ML INJ
10.0000 mg | INTRAVENOUS | Status: DC | PRN
  Filled 2011-11-29: qty 4

## 2011-11-29 MED ORDER — OXYTOCIN BOLUS FROM INFUSION
250.0000 mL | Freq: Once | INTRAVENOUS | Status: DC
  Filled 2011-11-29: qty 500

## 2011-11-29 MED ORDER — BUTORPHANOL TARTRATE 2 MG/ML IJ SOLN: 1.0000 mg | INTRAMUSCULAR | Status: DC | PRN

## 2011-11-29 MED ORDER — LACTATED RINGERS IV SOLN
INTRAVENOUS | Status: DC
  Administered 2011-11-30 (×2): via INTRAVENOUS

## 2011-11-29 NOTE — MAU Note (Signed)
Transferred to room 166 via wheelchair. Report called to Belenda Cruise, RN

## 2011-11-30 ENCOUNTER — Encounter (HOSPITAL_COMMUNITY): Payer: Self-pay

## 2011-11-30 ENCOUNTER — Inpatient Hospital Stay (HOSPITAL_COMMUNITY): Payer: Medicaid Other | Admitting: Anesthesiology

## 2011-11-30 ENCOUNTER — Encounter (HOSPITAL_COMMUNITY): Payer: Self-pay | Admitting: Anesthesiology

## 2011-11-30 MED ORDER — DIPHENHYDRAMINE HCL 25 MG PO CAPS: 25.0000 mg | ORAL_CAPSULE | Freq: Four times a day (QID) | ORAL | Status: DC | PRN

## 2011-11-30 MED ORDER — FERROUS SULFATE 325 (65 FE) MG PO TABS
325.0000 mg | ORAL_TABLET | Freq: Two times a day (BID) | ORAL | Status: DC
  Administered 2011-12-01 – 2011-12-02 (×3): 325 mg via ORAL
  Filled 2011-11-30 (×3): qty 1

## 2011-11-30 MED ORDER — FENTANYL 2.5 MCG/ML BUPIVACAINE 1/10 % EPIDURAL INFUSION (WH - ANES)
INTRAMUSCULAR | Status: DC | PRN
  Administered 2011-11-30: 14 mL/h via EPIDURAL

## 2011-11-30 MED ORDER — SENNOSIDES-DOCUSATE SODIUM 8.6-50 MG PO TABS
2.0000 | ORAL_TABLET | Freq: Every day | ORAL | Status: DC
  Administered 2011-11-30 – 2011-12-01 (×2): 2 via ORAL

## 2011-11-30 MED ORDER — PRENATAL MULTIVITAMIN CH
1.0000 | ORAL_TABLET | Freq: Every day | ORAL | Status: DC
  Administered 2011-11-30 – 2011-12-02 (×3): 1 via ORAL
  Filled 2011-11-30 (×3): qty 1

## 2011-11-30 MED ORDER — LANOLIN HYDROUS EX OINT: TOPICAL_OINTMENT | CUTANEOUS | Status: DC | PRN

## 2011-11-30 MED ORDER — SODIUM BICARBONATE 8.4 % IV SOLN
INTRAVENOUS | Status: DC | PRN
  Administered 2011-11-30: 4 mL via EPIDURAL

## 2011-11-30 MED ORDER — WITCH HAZEL-GLYCERIN EX PADS: 1.0000 "application " | MEDICATED_PAD | CUTANEOUS | Status: DC | PRN

## 2011-11-30 MED ORDER — OXYTOCIN 40 UNITS IN LACTATED RINGERS INFUSION - SIMPLE MED
1.0000 m[IU]/min | INTRAVENOUS | Status: DC
  Administered 2011-11-30: 1 m[IU]/min via INTRAVENOUS
  Filled 2011-11-30: qty 1000

## 2011-11-30 MED ORDER — TETANUS-DIPHTH-ACELL PERTUSSIS 5-2.5-18.5 LF-MCG/0.5 IM SUSP
0.5000 mL | Freq: Once | INTRAMUSCULAR | Status: AC
  Administered 2011-12-01: 0.5 mL via INTRAMUSCULAR
  Filled 2011-11-30: qty 0.5

## 2011-11-30 MED ORDER — IBUPROFEN 600 MG PO TABS
600.0000 mg | ORAL_TABLET | Freq: Four times a day (QID) | ORAL | Status: DC
  Administered 2011-11-30 – 2011-12-02 (×7): 600 mg via ORAL
  Filled 2011-11-30 (×7): qty 1

## 2011-11-30 MED ORDER — ONDANSETRON HCL 4 MG PO TABS: 4.0000 mg | ORAL_TABLET | ORAL | Status: DC | PRN

## 2011-11-30 MED ORDER — DIBUCAINE 1 % RE OINT: 1.0000 "application " | TOPICAL_OINTMENT | RECTAL | Status: DC | PRN

## 2011-11-30 MED ORDER — TERBUTALINE SULFATE 1 MG/ML IJ SOLN: 0.2500 mg | Freq: Once | INTRAMUSCULAR | Status: DC | PRN

## 2011-11-30 MED ORDER — BENZOCAINE-MENTHOL 20-0.5 % EX AERO
1.0000 "application " | INHALATION_SPRAY | CUTANEOUS | Status: DC | PRN
  Filled 2011-11-30: qty 56

## 2011-11-30 MED ORDER — ZOLPIDEM TARTRATE 5 MG PO TABS: 5.0000 mg | ORAL_TABLET | Freq: Every evening | ORAL | Status: DC | PRN

## 2011-11-30 MED ORDER — OXYCODONE-ACETAMINOPHEN 5-325 MG PO TABS
1.0000 | ORAL_TABLET | ORAL | Status: DC | PRN
  Filled 2011-11-30: qty 1

## 2011-11-30 MED ORDER — SIMETHICONE 80 MG PO CHEW: 80.0000 mg | CHEWABLE_TABLET | ORAL | Status: DC | PRN

## 2011-11-30 MED ORDER — ONDANSETRON HCL 4 MG/2ML IJ SOLN: 4.0000 mg | INTRAMUSCULAR | Status: DC | PRN

## 2011-11-30 NOTE — Progress Notes (Signed)
Patient ID: Traci King, female   DOB: 01/01/1992, 21 y.o.   MRN: [id number] Patient has a history of herpes and she is  on Valtrex 500 by mouth daily no recent outbreak

## 2011-11-30 NOTE — Progress Notes (Signed)
Patient ID: Traci King, female   DOB: 01/01/1992, 21 y.o.   MRN: [id number] Vital signs normal Cervix 4 cm 90% vertex at a plus one station amniotomy performed the fluids clear and her contractions are still mild she'll be augmented with Pitocin to the

## 2011-11-30 NOTE — Anesthesia Preprocedure Evaluation (Signed)

## 2011-11-30 NOTE — Anesthesia Procedure Notes (Signed)
Epidural Patient location during procedure: OB  Preanesthetic Checklist Completed: patient identified, site marked, surgical consent, pre-op evaluation, timeout performed, IV checked, risks and benefits discussed and monitors and equipment checked  Epidural Patient position: sitting Prep: site prepped and draped and DuraPrep Patient monitoring: continuous pulse ox and blood pressure Approach: midline Injection technique: LOR air  Needle:  Needle type: Tuohy  Needle gauge: 17 G Needle length: 9 cm Needle insertion depth: 6 cm Catheter type: closed end flexible Catheter size: 19 Gauge Catheter at skin depth: 12 cm Test dose: negative  Assessment Events: blood not aspirated, injection not painful, no injection resistance, negative IV test and no paresthesia  Additional Notes Dosing of Epidural:  1st dose, through needle ............................................. epi 1:200K + Xylocaine 40 mg  2nd dose, through catheter, after waiting 3 minutes.....epi 1:200K + Xylocaine 40 mg  3rd dose, through catheter after waiting 3 minutes .............................Marcaine   4mg   ( mg Marcaine are expressed as equivilent  cc's medication removed from the 0.1%Bupiv / fentanyl syringe from L&D pump)  ( 2% Xylo charted as a single dose in Epic Meds for ease of charting; actual dosing was fractionated as above, for saftey's sake)  As each dose occurred, patient was free of IV sx; and patient exhibited no evidence of SA injection.  Patient is more comfortable after epidural dosed. Please see RN's note for documentation of vital signs,and FHR which are stable.  Patient reminded not to try to ambulate with numb legs, and that an RN must be present the 1st time she attempts to get up.    

## 2011-11-30 NOTE — H&P (Signed)
This is Dr. Francoise Ceo dictating the history and physical on  Traci King she's a 21 year old gravida 1 and and she has a positive GBS cervix 3 cm dilated 90% vertex 0 station 36 and 6 weeks EDC 12/22/2011 she was treated with penicillin regiment for GBS and at this time she her membranes are intact and she is contracting irregularly Past medical history negative Past surgical history negative System review negative Family history negative Physical exam so well-developed female in no distress Past medical history negative breasts Breasts negative Heart regular rhythm no murmurs no gallops Lungs clear Abdomen term Pelvic as described above Extremities negative

## 2011-11-30 NOTE — Progress Notes (Signed)
Report given to RN. Pt transferred to room 110 in stable condition.

## 2011-12-01 LAB — CBC
MCHC: 33.4 g/dL (ref 30.0–36.0)
Platelets: 179 10*3/uL (ref 150–400)
RDW: 12.2 % (ref 11.5–15.5)
WBC: 9.2 10*3/uL (ref 4.0–10.5)

## 2011-12-01 NOTE — Anesthesia Postprocedure Evaluation (Signed)
  Anesthesia Post-op Note  Patient: Traci King  Procedure(s) Performed: * No procedures listed *  Patient Location: PACU and Mother/Baby  Anesthesia Type: Epidural  Level of Consciousness: awake, alert  and oriented  Airway and Oxygen Therapy: Patient Spontanous Breathing     Post-op Assessment: Patient's Cardiovascular Status Stable and Respiratory Function Stable  Post-op Vital Signs: stable  Complications: No apparent anesthesia complications

## 2011-12-01 NOTE — Progress Notes (Signed)
Patient ID: Traci King, female   DOB: 01/01/1992, 21 y.o.   MRN: [id number] Postpartum day one Vital signs normal Fundus firm Legs negative Doing well

## 2011-12-02 NOTE — Progress Notes (Signed)
UR chart review completed.  

## 2011-12-02 NOTE — Discharge Summary (Signed)
Obstetric Discharge Summary Reason for Admission: onset of labor Prenatal Procedures: none Intrapartum Procedures: spontaneous vaginal delivery Postpartum Procedures: none Complications-Operative and Postpartum: none Hemoglobin  Date Value Range Status  12/01/2011 11.5* 12.0 - 15.0 g/dL Final     HCT  Date Value Range Status  12/01/2011 34.4* 36.0 - 46.0 % Final    Physical Exam:  General: alert Lochia: appropriate Uterine Fundus: firm Incision: healing well DVT Evaluation: No evidence of DVT seen on physical exam.  Discharge Diagnoses: Term Pregnancy-delivered and Premature labor  Discharge Information: Date: 12/02/2011 Activity: pelvic rest Diet: routine Medications: Percocet Condition: stable Instructions: refer to practice specific booklet Discharge to: home Follow-up Information    Follow up with Samanatha Brammer A, MD. Call in 6 weeks.   Contact information:   [address] [place] [address] [phone]          Newborn Data: Live born female  Birth Weight: 5 lb 6.8 oz (2460 g) APGAR: 9, 9  Home with mother.  Traci King 12/02/2011, 6:31 AM

## 2012-01-10 ENCOUNTER — Emergency Department (HOSPITAL_COMMUNITY)
Admission: EM | Admit: 2012-01-10 | Discharge: 2012-01-10 | Disposition: A | Discharge status: Home / Self Care | Payer: Medicaid Other | Attending: Emergency Medicine | Admitting: Emergency Medicine

## 2012-01-10 ENCOUNTER — Encounter (HOSPITAL_COMMUNITY): Payer: Self-pay | Admitting: Family Medicine

## 2012-01-10 DIAGNOSIS — Z87891 Personal history of nicotine dependence: Secondary | ICD-10-CM | POA: Insufficient documentation

## 2012-01-10 DIAGNOSIS — M545 Low back pain, unspecified: Secondary | ICD-10-CM | POA: Insufficient documentation

## 2012-01-10 MED ORDER — IBUPROFEN 200 MG PO TABS
600.0000 mg | ORAL_TABLET | Freq: Once | ORAL | Status: AC
  Administered 2012-01-10: 600 mg via ORAL
  Filled 2012-01-10: qty 1

## 2012-01-10 MED ORDER — IBUPROFEN 600 MG PO TABS: 600.0000 mg | ORAL_TABLET | Freq: Four times a day (QID) | ORAL | Status: AC | PRN

## 2012-01-10 NOTE — ED Notes (Signed)
Pt received epidural x6 weeks ago during birth of child. States she has been having lower back pain ever since.

## 2012-01-10 NOTE — ED Provider Notes (Signed)
History     CSN: [id number]  Arrival date & time 01/10/12  1756   First MD Initiated Contact with Patient 01/10/12 2029      Chief Complaint  Patient presents with  . Back Pain     HPI Post epidural 6 weeks during birth. Pain has never completely went away. Worsened today. Rates pain 9/10. Sharp in nature. Pt was taking percocet and motrin at home which controlled her pain, but hasn't taken it in 5 weeks now patient denies weakness, bowel or bladder incontinence.  Denies numbness.  Denies foot drop.  The patient did say that she took ibuprofen 200 mg x1 but no further times.  He did not give her any relief.  I did discuss her the need to take a higher dose for longer period of time.  Past Medical History  Diagnosis Date  . History of stomach ulcers   . Abnormal Pap smear   . Herpes simplex type 1 infection     Past Surgical History  Procedure Date  . No past surgeries     Family History  Problem Relation Age of Onset  . Hypertension Mother   . Diabetes Mother   . Hypertension Maternal Grandmother   . Diabetes Maternal Grandfather     History  Substance Use Topics  . Smoking status: Former Smoker -- 0.2 packs/day    Quit date: 04/09/2011  . Smokeless tobacco: Not on file  . Alcohol Use: No    OB History    Grav Para Term Preterm Abortions TAB SAB Ect Mult Living   1 1 0 1 0 0 0 0 0 1       Review of Systems  All other systems reviewed and are negative.    Allergies  Review of patient's allergies indicates no known allergies.  Home Medications   Current Outpatient Rx  Name Route Sig Dispense Refill  . MEDROXYPROGESTERONE ACETATE 150 MG/ML IM SUSP Intramuscular Inject 150 mg into the muscle every 3 (three) months.    Marland Kitchen PRENATAL MULTIVITAMIN CH Oral Take 1 tablet by mouth daily.    . IBUPROFEN 600 MG PO TABS Oral Take 1 tablet (600 mg total) by mouth every 6 (six) hours as needed for pain. 30 tablet 0    BP 125/85  Pulse 72  Temp 98.4 F (36.9 C)  (Oral)  Resp 18  Ht 5\' 2"  (1.575 m)  Wt 140 lb (63.504 kg)  BMI 25.61 kg/m2  SpO2 100%  Breastfeeding? No  Physical Exam  Nursing note and vitals reviewed. Constitutional: She is oriented to person, place, and time. She appears well-developed. No distress.  HENT:  Head: Normocephalic and atraumatic.  Eyes: Pupils are equal, round, and reactive to light.  Neck: Normal range of motion.  Cardiovascular: Normal rate and intact distal pulses.   Pulmonary/Chest: No respiratory distress.  Abdominal: Normal appearance. She exhibits no distension.  Musculoskeletal: Normal range of motion.       Back:  Neurological: She is alert and oriented to person, place, and time. No cranial nerve deficit.  Skin: Skin is warm and dry. No rash noted.  Psychiatric: She has a normal mood and affect. Her behavior is normal.    ED Course  Procedures (including critical care time)  Labs Reviewed - No data to display No results found.   1. Low back pain       MDM  Plan is to start on adequate NSAID dosing.  We'll schedule MRI for Tuesday.  Patient return  at that time if she continues to have pain is not relieved with the proper dosing.        Nelia Shi, MD 01/10/12 [phone]

## 2012-01-10 NOTE — ED Notes (Signed)
Post epidural 6 weeks during birth. Pain has never completely went away. Worsened today. Rates pain 9/10. Sharp in nature. Pt was taking percocet and motrin at home which controlled her pain, but  hasn't taken it in 5 weeks now

## 2012-01-10 NOTE — ED Notes (Signed)
MD at bedside. 

## 2012-07-26 ENCOUNTER — Emergency Department (HOSPITAL_COMMUNITY)
Admission: EM | Admit: 2012-07-26 | Discharge: 2012-07-26 | Disposition: A | Discharge status: Home / Self Care | Payer: Self-pay | Attending: Emergency Medicine | Admitting: Emergency Medicine

## 2012-07-26 ENCOUNTER — Encounter (HOSPITAL_COMMUNITY): Payer: Self-pay | Admitting: *Deleted

## 2012-07-26 DIAGNOSIS — L02219 Cutaneous abscess of trunk, unspecified: Secondary | ICD-10-CM | POA: Insufficient documentation

## 2012-07-26 DIAGNOSIS — Z8711 Personal history of peptic ulcer disease: Secondary | ICD-10-CM | POA: Insufficient documentation

## 2012-07-26 DIAGNOSIS — Z79899 Other long term (current) drug therapy: Secondary | ICD-10-CM | POA: Insufficient documentation

## 2012-07-26 DIAGNOSIS — Z87891 Personal history of nicotine dependence: Secondary | ICD-10-CM | POA: Insufficient documentation

## 2012-07-26 DIAGNOSIS — Z8619 Personal history of other infectious and parasitic diseases: Secondary | ICD-10-CM | POA: Insufficient documentation

## 2012-07-26 DIAGNOSIS — R109 Unspecified abdominal pain: Secondary | ICD-10-CM | POA: Insufficient documentation

## 2012-07-26 MED ORDER — ACETAMINOPHEN 325 MG PO TABS
650.0000 mg | ORAL_TABLET | Freq: Once | ORAL | Status: AC
  Administered 2012-07-26: 650 mg via ORAL
  Filled 2012-07-26: qty 2

## 2012-07-26 MED ORDER — HYDROCODONE-ACETAMINOPHEN 5-325 MG PO TABS: 1.0000 | ORAL_TABLET | ORAL | Status: DC | PRN

## 2012-07-26 NOTE — ED Provider Notes (Signed)
Medical screening examination/treatment/procedure(s) were performed by non-physician practitioner and as supervising physician I was immediately available for consultation/collaboration.   Kevin E Steinl, MD 07/26/12 1955 

## 2012-07-26 NOTE — ED Provider Notes (Signed)
History    This chart was scribed for non-physician practitioner working with Suzi Roots, MD by Melba Coon, ED Scribe. This patient was seen in room WTR9/WTR9 and the patient's care was started at 5:41PM.   CSN: [id number]  Arrival date & time 07/26/12  1523   First MD Initiated Contact with Patient 07/26/12 1632      Chief Complaint  Patient presents with  . Abscess    (Consider location/radiation/quality/duration/timing/severity/associated sxs/prior treatment) The history is provided by the patient. No language interpreter was used.   Traci King is a 22 y.o. female who presents to the Emergency Department complaining of constant, moderate, upper abdominal pain pertaining to an abscess with an onset 1 week ago that has gotten progressively worse with increased growth in size and pain.  She reports that it started as a "bump" 2 weeks ago (1 week before onset of pain) and has grown into a painful abscess which is why she present to the ED today. She denies drainage from the abscessed area. She reports she had similar abscesses when she was pregnant. Reports increased cold intolerance. Denies HA, fever, neck pain, sore throat, rash, back pain, CP, SOB, nausea, emesis, diarrhea, dysuria, or extremity pain, edema, weakness, numbness, or tingling. No known allergies. No other pertinent medical symptoms.  Past Medical History  Diagnosis Date  . History of stomach ulcers   . Abnormal Pap smear   . Herpes simplex type 1 infection     Past Surgical History  Procedure Laterality Date  . No past surgeries      Family History  Problem Relation Age of Onset  . Hypertension Mother   . Diabetes Mother   . Hypertension Maternal Grandmother   . Diabetes Maternal Grandfather     History  Substance Use Topics  . Smoking status: Former Smoker -- 0.25 packs/day    Quit date: 04/09/2011  . Smokeless tobacco: Not on file  . Alcohol Use: No    OB History   Grav Para Term  Preterm Abortions TAB SAB Ect Mult Living   1 1 0 1 0 0 0 0 0 1       Review of Systems 10 Systems reviewed and all are negative for acute change except as noted in the HPI.   Allergies  Review of patient's allergies indicates no known allergies.  Home Medications   Current Outpatient Rx  Name  Route  Sig  Dispense  Refill  . medroxyPROGESTERone (DEPO-PROVERA) 150 MG/ML injection   Intramuscular   Inject 150 mg into the muscle every 3 (three) months.         Marland Kitchen HYDROcodone-acetaminophen (NORCO/VICODIN) 5-325 MG per tablet   Oral   Take 1 tablet by mouth every 4 (four) hours as needed for pain.   6 tablet   0     BP 120/76  Pulse 74  Temp(Src) 98.3 F (36.8 C) (Oral)  Resp 18  SpO2 100%  Physical Exam  Nursing note and vitals reviewed. Constitutional: She is oriented to person, place, and time. She appears well-developed and well-nourished. No distress.  HENT:  Head: Normocephalic and atraumatic.  Eyes: EOM are normal.  Neck: Neck supple. No tracheal deviation present.  Cardiovascular: Normal rate.   Pulmonary/Chest: Effort normal. No respiratory distress.  Abdominal: Soft. She exhibits no distension. There is no rebound and no guarding.  2 cm abscess to the left sided umbilicus without surrounding cellulitis.  Musculoskeletal: Normal range of motion.  Neurological: She is alert and  oriented to person, place, and time.  Skin: Skin is warm and dry.  Psychiatric: She has a normal mood and affect. Her behavior is normal.    ED Course  Procedures (including critical care time)  DIAGNOSTIC STUDIES: Oxygen Saturation is 96% on room air, adequate by my interpretation.    COORDINATION OF CARE:  5:46PM - I&D will be performed for Traci King.  INCISION AND DRAINAGE   Performed by: non-physician practitioner Authorized by: Suzi Roots, MD  Consent - Verbal Consent obtained Risks and benefits: risks/benefits and alternatives were discussed  Type:  Abscess  Body Area: left umbilical area  Anesthesia: Local infiltration Local anesthetic: lidocaine 2% with epinephrine  Anesthetic total: 2 ml  Complexity: Complex  Blunt dissection to break up loculations  Drainage: Purulent  Drainage amount: moderate  Packing material: 1/4 iodoform gauze  Patient tolerance: Patient tolerated the procedure well with no immediate complications  6:15PM - recheck; pt reports some mild pain to the abdomen after I&D but is relieved that the abscess is drained. Tylenol will be ordered for her to take here at the ED. Vicodin will be prescribed and she is ready for d/c.   Labs Reviewed - No data to display No results found.   1. Abscess       MDM  I personally performed the services described in this documentation, which was scribed in my presence. The recorded information has been reviewed and is accurate.     Dorthula Matas, PA 07/26/12 1845

## 2012-07-26 NOTE — ED Notes (Signed)
Pt states has a "bump" on her upper abdominal area, states has had x 2 weeks but has gotten bigger and tender. Denies drainage from area.

## 2012-09-29 ENCOUNTER — Emergency Department (HOSPITAL_COMMUNITY)
Admission: EM | Admit: 2012-09-29 | Discharge: 2012-09-29 | Disposition: A | Discharge status: Home / Self Care | Payer: Medicaid Other | Attending: Emergency Medicine | Admitting: Emergency Medicine

## 2012-09-29 ENCOUNTER — Encounter (HOSPITAL_COMMUNITY): Payer: Self-pay

## 2012-09-29 DIAGNOSIS — R05 Cough: Secondary | ICD-10-CM | POA: Insufficient documentation

## 2012-09-29 DIAGNOSIS — R51 Headache: Secondary | ICD-10-CM | POA: Insufficient documentation

## 2012-09-29 DIAGNOSIS — Z87891 Personal history of nicotine dependence: Secondary | ICD-10-CM | POA: Insufficient documentation

## 2012-09-29 DIAGNOSIS — Z79899 Other long term (current) drug therapy: Secondary | ICD-10-CM | POA: Insufficient documentation

## 2012-09-29 DIAGNOSIS — IMO0001 Reserved for inherently not codable concepts without codable children: Secondary | ICD-10-CM | POA: Insufficient documentation

## 2012-09-29 DIAGNOSIS — Z8742 Personal history of other diseases of the female genital tract: Secondary | ICD-10-CM | POA: Insufficient documentation

## 2012-09-29 DIAGNOSIS — R059 Cough, unspecified: Secondary | ICD-10-CM | POA: Insufficient documentation

## 2012-09-29 DIAGNOSIS — J069 Acute upper respiratory infection, unspecified: Secondary | ICD-10-CM

## 2012-09-29 DIAGNOSIS — Z8719 Personal history of other diseases of the digestive system: Secondary | ICD-10-CM | POA: Insufficient documentation

## 2012-09-29 MED ORDER — HYDROCOD POLST-CHLORPHEN POLST 10-8 MG/5ML PO LQCR: 5.0000 mL | Freq: Two times a day (BID) | ORAL | Status: DC | PRN

## 2012-09-29 MED ORDER — HYDROCODONE-ACETAMINOPHEN 5-325 MG PO TABS
1.0000 | ORAL_TABLET | Freq: Once | ORAL | Status: AC
  Administered 2012-09-29: 1 via ORAL
  Filled 2012-09-29: qty 1

## 2012-09-29 NOTE — ED Notes (Signed)
PA at bedside.

## 2012-09-29 NOTE — ED Notes (Signed)
Pt states sore throat and body aches for the past 3 days

## 2012-09-29 NOTE — ED Provider Notes (Signed)
History     CSN: [id number]  Arrival date & time 09/29/12  4098   First MD Initiated Contact with Patient 09/29/12 [phone]      Chief Complaint  Patient presents with  . Sore Throat    (Consider location/radiation/quality/duration/timing/severity/associated sxs/prior treatment) HPI History provided by pt.   Pt presents w/ sore throat x 3 days.  Gradually worsening.  No relief w/ advil or cough drops.  Associated w/ frontal headache, non-productive cough and body aches.  Denies fever, nasal congestion, rhinorrhea, N/V/D, urinary sx.  No known sick contacts.  Past Medical History  Diagnosis Date  . History of stomach ulcers   . Abnormal Pap smear   . Herpes simplex type 1 infection     Past Surgical History  Procedure Laterality Date  . No past surgeries      Family History  Problem Relation Age of Onset  . Hypertension Mother   . Diabetes Mother   . Hypertension Maternal Grandmother   . Diabetes Maternal Grandfather     History  Substance Use Topics  . Smoking status: Former Smoker -- 0.25 packs/day    Quit date: 04/09/2011  . Smokeless tobacco: Not on file  . Alcohol Use: No    OB History   Grav Para Term Preterm Abortions TAB SAB Ect Mult Living   1 1 0 1 0 0 0 0 0 1       Review of Systems  All other systems reviewed and are negative.    Allergies  Review of patient's allergies indicates no known allergies.  Home Medications   Current Outpatient Rx  Name  Route  Sig  Dispense  Refill  . ibuprofen (ADVIL,MOTRIN) 200 MG tablet   Oral   Take 400 mg by mouth every 6 (six) hours as needed for pain.         . medroxyPROGESTERone (DEPO-PROVERA) 150 MG/ML injection   Intramuscular   Inject 150 mg into the muscle every 3 (three) months.           BP 128/77  Pulse 97  Temp(Src) 98.7 F (37.1 C) (Oral)  Resp 18  Ht 5\' 3"  (1.6 m)  Wt 140 lb (63.504 kg)  BMI 24.81 kg/m2  SpO2 100%  LMP 09/08/2012  Physical Exam  Nursing note and vitals  reviewed. Constitutional: She is oriented to person, place, and time. She appears well-developed and well-nourished. No distress.  HENT:  Head: Normocephalic and atraumatic.  Frontal sinus ttp.  Mild erythema of posterior pharynx and soft palate.  No edema or exudate of tonsils.    Eyes:  Normal appearance  Neck: Normal range of motion.  Cardiovascular: Normal rate and regular rhythm.   Pulmonary/Chest: Effort normal and breath sounds normal. No respiratory distress.  Musculoskeletal: Normal range of motion.  Neurological: She is alert and oriented to person, place, and time.  Skin: Skin is warm and dry. No rash noted.  Psychiatric: She has a normal mood and affect. Her behavior is normal.    ED Course  Procedures (including critical care time)  Labs Reviewed  RAPID STREP SCREEN   No results found.   1. Viral URI       MDM  21yo healthy F presents w/ sore throat.  Strep screen neg and low clinical suspicion for strep.  Will treat symptomatically for viral URI w/ tussionex for cough/pain, 800mg  ibuprofen and sudafed.  Return precautions discussed.  4:17 AM         Otilio Miu, PA-C  09/29/12 0423 

## 2012-09-30 NOTE — ED Provider Notes (Signed)
Medical screening examination/treatment/procedure(s) were performed by non-physician practitioner and as supervising physician I was immediately available for consultation/collaboration.  Deriyah Kunath T Danielly Ackerley, MD 09/30/12 0312 

## 2012-10-02 ENCOUNTER — Emergency Department (HOSPITAL_COMMUNITY): Payer: Medicaid Other

## 2012-10-02 ENCOUNTER — Emergency Department (HOSPITAL_COMMUNITY)
Admission: EM | Admit: 2012-10-02 | Discharge: 2012-10-02 | Disposition: A | Discharge status: Home / Self Care | Payer: Medicaid Other | Attending: Emergency Medicine | Admitting: Emergency Medicine

## 2012-10-02 ENCOUNTER — Encounter (HOSPITAL_COMMUNITY): Payer: Self-pay

## 2012-10-02 DIAGNOSIS — R059 Cough, unspecified: Secondary | ICD-10-CM | POA: Insufficient documentation

## 2012-10-02 DIAGNOSIS — J040 Acute laryngitis: Secondary | ICD-10-CM

## 2012-10-02 DIAGNOSIS — Z8719 Personal history of other diseases of the digestive system: Secondary | ICD-10-CM | POA: Insufficient documentation

## 2012-10-02 DIAGNOSIS — Z87891 Personal history of nicotine dependence: Secondary | ICD-10-CM | POA: Insufficient documentation

## 2012-10-02 DIAGNOSIS — J069 Acute upper respiratory infection, unspecified: Secondary | ICD-10-CM | POA: Insufficient documentation

## 2012-10-02 DIAGNOSIS — Z8619 Personal history of other infectious and parasitic diseases: Secondary | ICD-10-CM | POA: Insufficient documentation

## 2012-10-02 DIAGNOSIS — R05 Cough: Secondary | ICD-10-CM | POA: Insufficient documentation

## 2012-10-02 LAB — MONONUCLEOSIS SCREEN: Mono Screen: NEGATIVE

## 2012-10-02 MED ORDER — ALBUTEROL SULFATE (5 MG/ML) 0.5% IN NEBU
5.0000 mg | INHALATION_SOLUTION | Freq: Once | RESPIRATORY_TRACT | Status: AC
  Administered 2012-10-02: 5 mg via RESPIRATORY_TRACT
  Filled 2012-10-02: qty 1

## 2012-10-02 MED ORDER — GI COCKTAIL ~~LOC~~
30.0000 mL | Freq: Once | ORAL | Status: AC
  Administered 2012-10-02: 30 mL via ORAL
  Filled 2012-10-02: qty 30

## 2012-10-02 MED ORDER — LIDOCAINE VISCOUS 2 % MT SOLN: 15.0000 mL | OROMUCOSAL | Status: DC | PRN

## 2012-10-02 NOTE — ED Provider Notes (Signed)
History     CSN: [id number]  Arrival date & time 10/02/12  1317   First MD Initiated Contact with Patient 10/02/12 1438      Chief Complaint  Patient presents with  . URI    (Consider location/radiation/quality/duration/timing/severity/associated sxs/prior treatment) HPI  Traci King is a 22 y.o. female complaining of severe throat she's had for 5 days and significantly worsening today. Patient was seen several days ago tested for strep which came back negative. She is given Tussionex cough suppressant with little relief. Patient has an extremely hoarse voice. She denies fever, nausea vomiting, change in bowel or bladder habits, difficulty swallowing her secretions chest pain she endorses a dry cough shortness of breath. Is up-to-date on childhood vaccinations Past Medical History  Diagnosis Date  . History of stomach ulcers   . Abnormal Pap smear   . Herpes simplex type 1 infection     Past Surgical History  Procedure Laterality Date  . No past surgeries      Family History  Problem Relation Age of Onset  . Hypertension Mother   . Diabetes Mother   . Hypertension Maternal Grandmother   . Diabetes Maternal Grandfather     History  Substance Use Topics  . Smoking status: Former Smoker -- 0.25 packs/day    Quit date: 04/09/2011  . Smokeless tobacco: Not on file  . Alcohol Use: No    OB History   Grav Para Term Preterm Abortions TAB SAB Ect Mult Living   1 1 0 1 0 0 0 0 0 1       Review of Systems  Allergies  Review of patient's allergies indicates no known allergies.  Home Medications   Current Outpatient Rx  Name  Route  Sig  Dispense  Refill  . chlorpheniramine-HYDROcodone (TUSSIONEX PENNKINETIC ER) 10-8 MG/5ML LQCR   Oral   Take 5 mLs by mouth every 12 (twelve) hours as needed.   50 mL   0   . ibuprofen (ADVIL,MOTRIN) 200 MG tablet   Oral   Take 400 mg by mouth every 6 (six) hours as needed for pain.         . medroxyPROGESTERone  (DEPO-PROVERA) 150 MG/ML injection   Intramuscular   Inject 150 mg into the muscle every 3 (three) months.           BP 122/81  Pulse 94  Temp(Src) 98.9 F (37.2 C) (Oral)  Resp 18  Ht 5\' 3"  (1.6 m)  Wt 144 lb 3.2 oz (65.409 kg)  BMI 25.55 kg/m2  SpO2 99%  LMP 09/08/2012  Physical Exam  Nursing note and vitals reviewed. Constitutional: She is oriented to person, place, and time. She appears well-developed and well-nourished. No distress.  Hoarse voice  HENT:  Head: Normocephalic and atraumatic.  Right Ear: External ear normal.  Left Ear: External ear normal.  Nose: Nose normal.  Mouth/Throat: Oropharynx is clear and moist. No oropharyngeal exudate.  Tonsils are minimally hypertrophic at 1+ bilaterally there is no exudate, the posterior pharynx is mildly erythematous with injection.  Bilateral tympanic membranes show normal architecture with good light reflex.  No tenderness to palpation or firmness underneath the tongue.  Patient is handling her secretions without issue  Eyes: Conjunctivae and EOM are normal. Pupils are equal, round, and reactive to light.  Neck: Normal range of motion. No thyromegaly present.  Shotty anterior cervical lymphadenopathy with mild tenderness to  Cardiovascular: Normal rate, regular rhythm and normal heart sounds.   Pulmonary/Chest: Effort normal  and breath sounds normal. No stridor. No respiratory distress. She has no wheezes. She has no rales. She exhibits no tenderness.  Abdominal: Soft. Bowel sounds are normal. She exhibits no distension and no mass. There is no tenderness. There is no rebound and no guarding.  Musculoskeletal: Normal range of motion.  Lymphadenopathy:    She has cervical adenopathy.  Neurological: She is alert and oriented to person, place, and time.  Psychiatric: She has a normal mood and affect.    ED Course  Procedures (including critical care time)  Labs Reviewed  GONOCOCCUS CULTURE  CHLAMYDIA CULTURE   MONONUCLEOSIS SCREEN   Dg Chest 2 View  10/02/2012  *RADIOLOGY REPORT*  Clinical Data: Shortness of breath, prior smoker  CHEST - 2 VIEW  Comparison:  12/09/2010  Findings:  The heart size and mediastinal contours are within normal limits.  Both lungs are clear.  The visualized skeletal structures are unremarkable. Developmental deformity of the left first rib noted.  IMPRESSION: No active cardiopulmonary disease.   Original Report Authenticated By: Judie Petit. Shick, M.D.      1. Laryngitis       MDM   Traci King is a 22 y.o. female with sore throat and hoarse voice worsening over the course of the last 5 days. Patient has no issue handling her secretions. She is has no stridor I have no airway concerns at this time. There is no firmness or tenderness to palpation under her tongue I doubt a lead weight angina. Patient is afebrile. Was seen and had a rapid strep several days ago. Her mono results are negative and chest x-ray is also negative I have swab the posterior pharynx and I'm sending it for GC and Chlamydia testing.  Reports significant subjective improvement after GI cocktail. I will send her home with viscous lidocaine.  Discussion return precautions discussed with the patient and her mother and extended family. Patient verbalized her understanding I have asked her to return for recheck if she does not have any improvement or if she worsens.   Filed Vitals:   10/02/12 1335 10/02/12 1337  BP: 122/81   Pulse: 94   Temp: 98.9 F (37.2 C)   TempSrc: Oral   Resp: 18   Height: 5\' 3"  (1.6 m)   Weight: 144 lb 3.2 oz (65.409 kg)   SpO2: 99% 99%     VSS and patient is appropriate for, and amenable to, discharge at this time. Pt verbalized understanding and agrees with care plan. Outpatient follow-up and return precautions given.    New Prescriptions   LIDOCAINE (XYLOCAINE) 2 % SOLUTION    Take 15 mLs by mouth every 3 (three) hours as needed for pain.           Wynetta Emery, PA-C 10/02/12 1708

## 2012-10-02 NOTE — ED Notes (Signed)
Pt c/o increasing URI symptoms and sore throat.  Pain score 10/10.  Unproductive cough noted.  Pt was seen at [facility], on Tuesday, for the same complaint.

## 2012-10-03 NOTE — ED Provider Notes (Signed)
Medical screening examination/treatment/procedure(s) were performed by non-physician practitioner and as supervising physician I was immediately available for consultation/collaboration.   Gavin Pound. Oletta Lamas, MD 10/03/12 [phone]

## 2012-10-09 LAB — CHLAMYDIA CULTURE

## 2012-10-12 LAB — MISCELLANEOUS TEST

## 2013-01-22 ENCOUNTER — Encounter (HOSPITAL_COMMUNITY): Payer: Self-pay | Admitting: Emergency Medicine

## 2013-01-22 ENCOUNTER — Emergency Department (HOSPITAL_COMMUNITY): Payer: BC Managed Care – PPO

## 2013-01-22 ENCOUNTER — Emergency Department (HOSPITAL_COMMUNITY)
Admission: EM | Admit: 2013-01-22 | Discharge: 2013-01-22 | Disposition: A | Discharge status: Home / Self Care | Payer: BC Managed Care – PPO | Attending: Emergency Medicine | Admitting: Emergency Medicine

## 2013-01-22 DIAGNOSIS — R1013 Epigastric pain: Secondary | ICD-10-CM | POA: Insufficient documentation

## 2013-01-22 DIAGNOSIS — R042 Hemoptysis: Secondary | ICD-10-CM | POA: Insufficient documentation

## 2013-01-22 DIAGNOSIS — Z8619 Personal history of other infectious and parasitic diseases: Secondary | ICD-10-CM | POA: Insufficient documentation

## 2013-01-22 DIAGNOSIS — J029 Acute pharyngitis, unspecified: Secondary | ICD-10-CM | POA: Insufficient documentation

## 2013-01-22 DIAGNOSIS — R112 Nausea with vomiting, unspecified: Secondary | ICD-10-CM | POA: Insufficient documentation

## 2013-01-22 DIAGNOSIS — R05 Cough: Secondary | ICD-10-CM | POA: Insufficient documentation

## 2013-01-22 DIAGNOSIS — Z3202 Encounter for pregnancy test, result negative: Secondary | ICD-10-CM | POA: Insufficient documentation

## 2013-01-22 DIAGNOSIS — Z8719 Personal history of other diseases of the digestive system: Secondary | ICD-10-CM | POA: Insufficient documentation

## 2013-01-22 DIAGNOSIS — R059 Cough, unspecified: Secondary | ICD-10-CM | POA: Insufficient documentation

## 2013-01-22 DIAGNOSIS — M791 Myalgia, unspecified site: Secondary | ICD-10-CM

## 2013-01-22 DIAGNOSIS — R52 Pain, unspecified: Secondary | ICD-10-CM | POA: Insufficient documentation

## 2013-01-22 DIAGNOSIS — IMO0001 Reserved for inherently not codable concepts without codable children: Secondary | ICD-10-CM | POA: Insufficient documentation

## 2013-01-22 DIAGNOSIS — Z87891 Personal history of nicotine dependence: Secondary | ICD-10-CM | POA: Insufficient documentation

## 2013-01-22 DIAGNOSIS — J069 Acute upper respiratory infection, unspecified: Secondary | ICD-10-CM | POA: Insufficient documentation

## 2013-01-22 LAB — URINALYSIS, ROUTINE W REFLEX MICROSCOPIC
Hgb urine dipstick: NEGATIVE
Leukocytes, UA: NEGATIVE
Nitrite: NEGATIVE
Protein, ur: NEGATIVE mg/dL
Specific Gravity, Urine: 1.015 (ref 1.005–1.030)
Urobilinogen, UA: 0.2 mg/dL (ref 0.0–1.0)

## 2013-01-22 LAB — BASIC METABOLIC PANEL
Calcium: 9.2 mg/dL (ref 8.4–10.5)
GFR calc non Af Amer: >90 mL/min (ref >90)
Glucose, Bld: 86 mg/dL (ref 70–99)
Potassium: 3.9 mEq/L (ref 3.5–5.1)
Sodium: 137 mEq/L (ref 135–145)

## 2013-01-22 LAB — POCT PREGNANCY, URINE: Preg Test, Ur: NEGATIVE

## 2013-01-22 MED ORDER — SODIUM CHLORIDE 0.9 % IV BOLUS (SEPSIS)
1000.0000 mL | Freq: Once | INTRAVENOUS | Status: AC
  Administered 2013-01-22: 1000 mL via INTRAVENOUS

## 2013-01-22 MED ORDER — ONDANSETRON 4 MG PO TBDP: ORAL_TABLET | ORAL | Status: DC

## 2013-01-22 MED ORDER — ONDANSETRON HCL 4 MG/2ML IJ SOLN
4.0000 mg | Freq: Once | INTRAMUSCULAR | Status: AC
  Administered 2013-01-22: 4 mg via INTRAVENOUS
  Filled 2013-01-22: qty 2

## 2013-01-22 MED ORDER — KETOROLAC TROMETHAMINE 30 MG/ML IJ SOLN
30.0000 mg | Freq: Once | INTRAMUSCULAR | Status: AC
  Administered 2013-01-22: 30 mg via INTRAVENOUS
  Filled 2013-01-22: qty 1

## 2013-01-22 NOTE — ED Notes (Signed)
Patient reports her stomach hurts, feels like it is burning, she thinks it might be hunger pains as she has not eaten all day.

## 2013-01-22 NOTE — ED Notes (Addendum)
Pt reports feeling short of breath, coughing up blood, sore throat, and having generalized body aches, which has all began to occur over the past two days. Pt is A/Ox4 and in NAD.

## 2013-01-22 NOTE — ED Notes (Signed)
Patient reports feeling ill two days ago, then she developed a sore throat has been vomiting today and having generalized body aches.  Denies diarrhea.  Patient took ibuprofen pm last night but has not taken anything today.  Coughing up clear, whitish sputum. Patient also has nasal congestion.

## 2013-01-22 NOTE — ED Notes (Signed)
Pt escorted to discharge window. Pt verbalized understanding discharge instructions. In no acute distress.  

## 2013-01-22 NOTE — Progress Notes (Signed)
Patient confrims she does not have a pcp.  EDCM instructed patient to call the phone number listed on the back of her insurance card or go to insurance company's web site to help her find a physician who is close to her and within network.  Patient verbalized understanding.

## 2013-01-22 NOTE — ED Provider Notes (Signed)
CSN: [id number]     Arrival date & time 01/22/13  1554 History   First MD Initiated Contact with Patient 01/22/13 1610     Chief Complaint  Patient presents with  . Hemoptysis  . Shortness of Breath  . Generalized Body Aches   (Consider location/radiation/quality/duration/timing/severity/associated sxs/prior Treatment) HPI Comments: Vomiting x 7 today. On 5th episode, patient had small streaks of blood. No hemoptysis. No urinary output today.   Patient is a 22 y.o. female presenting with shortness of breath. The history is provided by the patient.  Shortness of Breath Severity:  Mild Onset quality:  Gradual Duration:  2 days Timing:  Constant Progression:  Worsening Chronicity:  New Context: not activity, not emotional upset, not fumes, not strong odors, not URI and not weather changes   Relieved by:  Nothing Worsened by:  Nothing tried Ineffective treatments:  None tried Associated symptoms: abdominal pain (epigastric), cough, sore throat, sputum production and vomiting   Associated symptoms: no ear pain, no fever, no headaches, no neck pain, no rash, no swollen glands and no wheezing     Past Medical History  Diagnosis Date  . History of stomach ulcers   . Abnormal Pap smear   . Herpes simplex type 1 infection    Past Surgical History  Procedure Laterality Date  . No past surgeries     Family History  Problem Relation Age of Onset  . Hypertension Mother   . Diabetes Mother   . Hypertension Maternal Grandmother   . Diabetes Maternal Grandfather    History  Substance Use Topics  . Smoking status: Former Smoker -- 0.25 packs/day    Quit date: 04/09/2011  . Smokeless tobacco: Never Used  . Alcohol Use: No   OB History   Grav Para Term Preterm Abortions TAB SAB Ect Mult Living   1 1 0 1 0 0 0 0 0 1      Review of Systems  Constitutional: Negative for fever.  HENT: Positive for sore throat. Negative for ear pain and neck pain.   Respiratory: Positive for cough,  sputum production and shortness of breath. Negative for wheezing.   Gastrointestinal: Positive for vomiting and abdominal pain (epigastric).  Skin: Negative for rash.  Neurological: Negative for headaches.  All other systems reviewed and are negative.    Allergies  Review of patient's allergies indicates no known allergies.  Home Medications   Current Outpatient Rx  Name  Route  Sig  Dispense  Refill  . etonogestrel (IMPLANON) 68 MG IMPL implant   Subcutaneous   Inject 1 each into the skin once.         Marland Kitchen ibuprofen (ADVIL,MOTRIN) 200 MG tablet   Oral   Take 400 mg by mouth every 6 (six) hours as needed for pain.         Marland Kitchen ondansetron (ZOFRAN ODT) 4 MG disintegrating tablet      4mg  ODT q4 hours prn nausea/vomit   4 tablet   0    BP 105/77  Pulse 72  Temp(Src) 98.7 F (37.1 C) (Oral)  Resp 21  Ht 5\' 3"  (1.6 m)  Wt 140 lb (63.504 kg)  BMI 24.81 kg/m2  SpO2 100% Physical Exam  Nursing note and vitals reviewed. Constitutional: She is oriented to person, place, and time. She appears well-developed and well-nourished. No distress.  HENT:  Head: Normocephalic and atraumatic.  TMs normal bilaterally  Eyes: EOM are normal. Pupils are equal, round, and reactive to light.  Neck: Normal range  of motion. Neck supple. No JVD present. No tracheal deviation present.  Cardiovascular: Normal rate and regular rhythm.  Exam reveals no friction rub.   No murmur heard. Pulmonary/Chest: Effort normal and breath sounds normal. No respiratory distress. She has no wheezes. She has no rales.  Abdominal: Soft. She exhibits no distension. There is tenderness (mild, epigastric). There is no rebound and no guarding.  Musculoskeletal: Normal range of motion. She exhibits no edema.  Neurological: She is alert and oriented to person, place, and time. No cranial nerve deficit. She exhibits normal muscle tone.  Skin: She is not diaphoretic.    ED Course  Procedures (including critical care  time) Labs Review Labs Reviewed  BASIC METABOLIC PANEL  URINALYSIS, ROUTINE W REFLEX MICROSCOPIC  POCT PREGNANCY, URINE   Imaging Review Dg Chest 2 View  01/22/2013   *RADIOLOGY REPORT*  Clinical Data: Hemoptysis and shortness of breath  CHEST - 2 VIEW  Comparison: 10/22/2012  Findings: The heart size and vascular pattern are normal.  The lungs are clear.  No change from prior study.  IMPRESSION: Negative   Original Report Authenticated By: Esperanza Heir, M.D.     Date: 01/22/2013  Rate: 102  Rhythm: sinus tachycardia  QRS Axis: normal  Intervals: normal  ST/T Wave abnormalities: normal  Conduction Disutrbances:none  Narrative Interpretation:   Old EKG Reviewed: none available   MDM   1. URI, acute   2. Myalgia   3. Nausea and vomiting    16F with no prior hx presents with 2 days of URI symtpoms. Nausea and vomiting began today. No urine output today. Associated myaglias, no fevers. Mild streaky blood in 2-3 episodes of her vomiting c/w Mallory-Weiss tear, no concern for frank hematemesis or frank hemoptysis.  Afebrile, normal BP. Mild tachycardia here. Mild epigastric pain. Throat normal. TMs normal. No lymphadenopathy. Will give fluids, toradol, check basic labs.  Labs normal. Improved after fluids. Stable for discharge.   Dagmar Hait, MD 01/22/13 2103

## 2013-04-09 ENCOUNTER — Emergency Department (HOSPITAL_COMMUNITY)
Admission: EM | Admit: 2013-04-09 | Discharge: 2013-04-09 | Disposition: A | Discharge status: Home / Self Care | Payer: BC Managed Care – PPO | Attending: Emergency Medicine | Admitting: Emergency Medicine

## 2013-04-09 ENCOUNTER — Encounter (HOSPITAL_COMMUNITY): Payer: Self-pay | Admitting: Emergency Medicine

## 2013-04-09 DIAGNOSIS — R079 Chest pain, unspecified: Secondary | ICD-10-CM | POA: Insufficient documentation

## 2013-04-09 DIAGNOSIS — R5381 Other malaise: Secondary | ICD-10-CM | POA: Insufficient documentation

## 2013-04-09 DIAGNOSIS — R52 Pain, unspecified: Secondary | ICD-10-CM | POA: Insufficient documentation

## 2013-04-09 DIAGNOSIS — R5383 Other fatigue: Secondary | ICD-10-CM

## 2013-04-09 DIAGNOSIS — Z8711 Personal history of peptic ulcer disease: Secondary | ICD-10-CM | POA: Insufficient documentation

## 2013-04-09 DIAGNOSIS — F17200 Nicotine dependence, unspecified, uncomplicated: Secondary | ICD-10-CM | POA: Insufficient documentation

## 2013-04-09 DIAGNOSIS — Z8619 Personal history of other infectious and parasitic diseases: Secondary | ICD-10-CM | POA: Insufficient documentation

## 2013-04-09 NOTE — ED Notes (Signed)
Pt states she went to work tonight and was not feeling well  Pt states she has been tired and feels like she is going to faint and has been breathing "funny" for the past couple of days  Pt states she is not sure what is wrong with her

## 2013-04-09 NOTE — ED Provider Notes (Signed)
Medical screening examination/treatment/procedure(s) were performed by non-physician practitioner and as supervising physician I was immediately available for consultation/collaboration.  Sunnie Nielsen, MD 04/09/13 [phone]

## 2013-04-09 NOTE — ED Provider Notes (Signed)
CSN: [id number]     Arrival date & time 04/09/13  0106 History   First MD Initiated Contact with Patient 04/09/13 0118     Chief Complaint  Patient presents with  . URI   (Consider location/radiation/quality/duration/timing/severity/associated sxs/prior Treatment) HPI Comments: Patient is a 22 year old female who presents for generalized myalgias and "not feeling well" x3 days. Patient states she has taken Tylenol for her symptoms without relief. Patient states that she has been working a lot lately and not sleeping as much as she normally does which she attributes to her symptoms. She states she intermittently feels worn out and as though she may faint. Patient also endorsing feeling as though she cannot take a deep breath. She denies fever, sore throat, nasal congestion, rhinorrhea, CP, syncope, N/V, abdominal pain, dysuria, hematuria, diarrhea, numbness/tingling, weakness, and the possibility of pregnancy. Patient is a daily smoker.  The history is provided by the patient. No language interpreter was used.    Past Medical History  Diagnosis Date  . History of stomach ulcers   . Abnormal Pap smear   . Herpes simplex type 1 infection    Past Surgical History  Procedure Laterality Date  . No past surgeries     Family History  Problem Relation Age of Onset  . Hypertension Mother   . Diabetes Mother   . Hypertension Maternal Grandmother   . Diabetes Maternal Grandfather    History  Substance Use Topics  . Smoking status: Current Every Day Smoker -- 0.25 packs/day    Types: Cigarettes    Last Attempt to Quit: 04/09/2011  . Smokeless tobacco: Never Used  . Alcohol Use: No   OB History   Grav Para Term Preterm Abortions TAB SAB Ect Mult Living   1 1 0 1 0 0 0 0 0 1      Review of Systems  Constitutional: Positive for fatigue.  Respiratory: Positive for chest tightness.   Musculoskeletal: Positive for myalgias.  All other systems reviewed and are negative.    Allergies   Review of patient's allergies indicates no known allergies.  Home Medications   Current Outpatient Rx  Name  Route  Sig  Dispense  Refill  . ibuprofen (ADVIL,MOTRIN) 400 MG tablet   Oral   Take 400 mg by mouth every 6 (six) hours as needed. For pain         . etonogestrel (IMPLANON) 68 MG IMPL implant   Subcutaneous   Inject 1 each into the skin once.          BP 121/87  Pulse 86  Temp(Src) 98.6 F (37 C) (Oral)  Resp 18  Ht 5\' 2"  (1.575 m)  Wt 122 lb (55.339 kg)  BMI 22.31 kg/m2  SpO2 98%  Physical Exam  Nursing note and vitals reviewed. Constitutional: She is oriented to person, place, and time. She appears well-developed and well-nourished. No distress.  HENT:  Head: Normocephalic and atraumatic.  Nose: Nose normal.  Mouth/Throat: Uvula is midline, oropharynx is clear and moist and mucous membranes are normal. No oropharyngeal exudate, posterior oropharyngeal erythema or tonsillar abscesses.  Eyes: Conjunctivae and EOM are normal. Pupils are equal, round, and reactive to light. No scleral icterus.  Neck: Normal range of motion. Neck supple.  Cardiovascular: Normal rate, regular rhythm and normal heart sounds.   Pulmonary/Chest: Effort normal. No respiratory distress. She has no wheezes. She has no rales.  Abdominal: Soft. She exhibits no distension. There is no tenderness.  Musculoskeletal: Normal range of motion.  Neurological: She is alert and oriented to person, place, and time.  Skin: Skin is warm and dry. No rash noted. She is not diaphoretic. No erythema. No pallor.  Psychiatric: She has a normal mood and affect. Her behavior is normal.    ED Course  Procedures (including critical care time) Labs Review Labs Reviewed - No data to display Imaging Review No results found.  EKG Interpretation   None       MDM   1. Body aches   2. Fatigue    22 year old female who presents for generalized fatigue and myalgias. Patient is well and nontoxic  appearing, hemodynamically stable, and afebrile. Will exam unremarkable today. Patient without upper respiratory symptoms. She is also about tachycardia, tachypnea, dyspnea, or hypoxia. Lungs clear to auscultation bilaterally; doubt pneumonia. Patient without posterior oropharyngeal erythema or edema. No tonsillar exudates. Uvula midline. Doubt strep pharyngitis or peritonsillar abscess. Patient denies the possibility of pregnancy as she has Implanon. Patient states that the symptoms she is experiencing are similar to when she was worn out from being overworked in the past. Believe patient is stable and appropriate for discharge with primary care followup. Have advised rest, adequate sleep and fluid hydration, and ibuprofen as needed for myalgias. Return precautions discussed and patient agreeable to plan with no unaddressed concerns.    Antony Madura, PA-C 04/09/13 [phone]

## 2013-04-28 ENCOUNTER — Emergency Department (HOSPITAL_COMMUNITY)
Admission: EM | Admit: 2013-04-28 | Discharge: 2013-04-28 | Disposition: A | Discharge status: Home / Self Care | Payer: BC Managed Care – PPO | Attending: Emergency Medicine | Admitting: Emergency Medicine

## 2013-04-28 ENCOUNTER — Encounter (HOSPITAL_COMMUNITY): Payer: Self-pay | Admitting: Emergency Medicine

## 2013-04-28 DIAGNOSIS — M545 Low back pain, unspecified: Secondary | ICD-10-CM | POA: Insufficient documentation

## 2013-04-28 DIAGNOSIS — Z8719 Personal history of other diseases of the digestive system: Secondary | ICD-10-CM | POA: Insufficient documentation

## 2013-04-28 DIAGNOSIS — M549 Dorsalgia, unspecified: Secondary | ICD-10-CM | POA: Diagnosis present

## 2013-04-28 DIAGNOSIS — Z8619 Personal history of other infectious and parasitic diseases: Secondary | ICD-10-CM | POA: Insufficient documentation

## 2013-04-28 DIAGNOSIS — G8928 Other chronic postprocedural pain: Secondary | ICD-10-CM | POA: Insufficient documentation

## 2013-04-28 DIAGNOSIS — F17200 Nicotine dependence, unspecified, uncomplicated: Secondary | ICD-10-CM | POA: Diagnosis not present

## 2013-04-28 MED ORDER — HYDROCODONE-ACETAMINOPHEN 5-325 MG PO TABS
1.0000 | ORAL_TABLET | Freq: Once | ORAL | Status: AC
  Administered 2013-04-28: 1 via ORAL
  Filled 2013-04-28: qty 1

## 2013-04-28 MED ORDER — DIAZEPAM 5 MG PO TABS
5.0000 mg | ORAL_TABLET | Freq: Once | ORAL | Status: AC
  Administered 2013-04-28: 5 mg via ORAL
  Filled 2013-04-28: qty 1

## 2013-04-28 MED ORDER — IBUPROFEN 600 MG PO TABS: 600.0000 mg | ORAL_TABLET | Freq: Four times a day (QID) | ORAL | Status: DC | PRN

## 2013-04-28 MED ORDER — HYDROCODONE-ACETAMINOPHEN 5-325 MG PO TABS: 1.0000 | ORAL_TABLET | Freq: Four times a day (QID) | ORAL | Status: DC | PRN

## 2013-04-28 MED ORDER — DIAZEPAM 5 MG PO TABS: 5.0000 mg | ORAL_TABLET | Freq: Three times a day (TID) | ORAL | Status: DC | PRN

## 2013-04-28 NOTE — ED Provider Notes (Signed)
CSN: [id number]     Arrival date & time 04/28/13  1840 History  This chart was scribed for Jaynie Crumble, PA-C, working with Donnetta Hutching, MD, by Ardelia Mems ED Scribe. This patient was seen in room WTR8/WTR8 and the patient's care was started at 8:32 PM.   Chief Complaint  Patient presents with  . Back Pain    The history is provided by the patient. No language interpreter was used.    HPI Comments: Traci King is a 22 y.o. female who presents to the Emergency Department complaining of chronic, intermittent lower back pain over the past year, with an acutely worsened "flare-up" onset 4 days ago. She states that her back pain onset initially after having an epidural with labor about 1 year ago. She states that she has had flare-ups of this pain every 1-2 months. She states that she has no PCP and does not see a doctor on an ongoing basis regarding this pain. She states that her pain does not radiate to her legs or elsewhere. She states that she has taken tried taking 1000 mg of Tylenol and 800 mg of Ibuprofen without relief. She denies any known injuries to have worsened her pain. She states that she has been exerting herself more than usual at work recently, and occasionally lifting boxes. She denies fever, bowel or bladder incontinence or any other pain or symptoms.  PCP- None   Past Medical History  Diagnosis Date  . History of stomach ulcers   . Abnormal Pap smear   . Herpes simplex type 1 infection    Past Surgical History  Procedure Laterality Date  . No past surgeries     Family History  Problem Relation Age of Onset  . Hypertension Mother   . Diabetes Mother   . Hypertension Maternal Grandmother   . Diabetes Maternal Grandfather    History  Substance Use Topics  . Smoking status: Current Every Day Smoker -- 0.25 packs/day    Types: Cigarettes    Last Attempt to Quit: 04/09/2011  . Smokeless tobacco: Never Used  . Alcohol Use: No   OB History   Grav Para  Term Preterm Abortions TAB SAB Ect Mult Living   1 1 0 1 0 0 0 0 0 1      Review of Systems  Constitutional: Negative for fever.  Gastrointestinal:       Denies bowel incontinence.  Genitourinary:       Denies bladder incontinence.  Musculoskeletal: Positive for back pain.  All other systems reviewed and are negative.   Allergies  Review of patient's allergies indicates no known allergies.  Home Medications   Current Outpatient Rx  Name  Route  Sig  Dispense  Refill  . etonogestrel (IMPLANON) 68 MG IMPL implant   Subcutaneous   Inject 1 each into the skin once.         Marland Kitchen ibuprofen (ADVIL,MOTRIN) 400 MG tablet   Oral   Take 400 mg by mouth every 6 (six) hours as needed. For pain          Triage Vitals: BP 130/67  Pulse 75  Temp(Src) 98.6 F (37 C) (Oral)  Resp 18  Ht 5\' 3"  (1.6 m)  Wt 130 lb (58.968 kg)  BMI 23.03 kg/m2  SpO2 100%  Physical Exam  Nursing note and vitals reviewed. Constitutional: She is oriented to person, place, and time. She appears well-developed and well-nourished. No distress.  HENT:  Head: Normocephalic and atraumatic.  Eyes: EOM  are normal.  Neck: Neck supple. No tracheal deviation present.  Cardiovascular: Normal rate.   Pulmonary/Chest: Effort normal. No respiratory distress.  Musculoskeletal: Normal range of motion.  Normal appearing thoracic and lumbar spine. Tender to palpation over midline lumbar spine and left paravertebral area. Patient is very tender even to the light-touch. No pain with bilateral straight leg raise. Pain worsened with forward flexion of the trunk and back extension.   Neurological: She is alert and oriented to person, place, and time.  5/5 and equal lower extremity strength. 2+ and equal patellar reflexes bilaterally. Pt able to dorsiflex bilateral toes and feet with good strength against resistance. Equal sensation bilaterally over thighs and lower legs.   Skin: Skin is warm and dry.  Psychiatric: She has a  normal mood and affect. Her behavior is normal.    ED Course  Procedures (including critical care time)  DIAGNOSTIC STUDIES: Oxygen Saturation is 100% on RA, normal by my interpretation.    COORDINATION OF CARE: 8:38 PM- Will order Norco and Valium. Pt advised of plan for treatment and pt agrees.  Labs Review Labs Reviewed - No data to display Imaging Review No results found.  EKG Interpretation   None       MDM  No diagnosis found.  Pt neurovascularly intact. Normal reflexes. Normal sensation. No loss of bowels or urinary incontinence/retention. No recent falls or injuries. Pt ambulatory. Afebrile. No signs of cauda equina.  Patient's back pain is chronic for over a year. She is stable for discharge at this time but no further imaging indicated. Will treat with ibuprofen, Valium, Norco. Patient instructed to followup with her primary care Dr.  Ceasar Mons Vitals:   04/28/13 1906 04/28/13 1926 04/28/13 2024  BP: 123/75  130/67  Pulse: 78  75  Temp: 99.7 F (37.6 C)  98.6 F (37 C)  TempSrc: Oral    Resp: 14  18  Height:  5\' 3"  (1.6 m)   Weight:  130 lb (58.968 kg)   SpO2: 99%  100%    I personally performed the services described in this documentation, which was scribed in my presence. The recorded information has been reviewed and is accurate.    Lottie Mussel, PA-C 04/28/13 2053

## 2013-04-28 NOTE — ED Notes (Signed)
Pt c/o low back pain x 1 year after having epidural during child birth. Pain intermittent worse last 4 days. Unrelieved with ibuprofen

## 2013-04-28 NOTE — ED Notes (Signed)
Pt states she has been having back pain for the past 4 days. Pt states she started feeling the pain while sitting at work and has felt the pain ever since.

## 2013-05-10 NOTE — ED Provider Notes (Signed)
Medical screening examination/treatment/procedure(s) were performed by non-physician practitioner and as supervising physician I was immediately available for consultation/collaboration.  EKG Interpretation   None        Brittinee Risk, MD 05/10/13 0752 

## 2014-03-28 ENCOUNTER — Encounter (HOSPITAL_COMMUNITY): Payer: Self-pay | Admitting: Emergency Medicine

## 2014-03-28 ENCOUNTER — Other Ambulatory Visit: Payer: Self-pay | Admitting: Nurse Practitioner

## 2014-03-28 DIAGNOSIS — R2232 Localized swelling, mass and lump, left upper limb: Secondary | ICD-10-CM

## 2014-04-12 ENCOUNTER — Other Ambulatory Visit: Payer: BC Managed Care – PPO

## 2015-03-01 ENCOUNTER — Encounter (HOSPITAL_COMMUNITY): Payer: Self-pay | Admitting: Emergency Medicine

## 2015-03-01 ENCOUNTER — Emergency Department (HOSPITAL_COMMUNITY): Payer: Medicaid Other

## 2015-03-01 ENCOUNTER — Emergency Department (HOSPITAL_COMMUNITY)
Admission: EM | Admit: 2015-03-01 | Discharge: 2015-03-01 | Disposition: A | Discharge status: Home / Self Care | Payer: Medicaid Other | Attending: Emergency Medicine | Admitting: Emergency Medicine

## 2015-03-01 DIAGNOSIS — Z720 Tobacco use: Secondary | ICD-10-CM | POA: Insufficient documentation

## 2015-03-01 DIAGNOSIS — R059 Cough, unspecified: Secondary | ICD-10-CM

## 2015-03-01 DIAGNOSIS — R05 Cough: Secondary | ICD-10-CM

## 2015-03-01 DIAGNOSIS — Z8719 Personal history of other diseases of the digestive system: Secondary | ICD-10-CM | POA: Insufficient documentation

## 2015-03-01 DIAGNOSIS — M791 Myalgia: Secondary | ICD-10-CM | POA: Insufficient documentation

## 2015-03-01 DIAGNOSIS — Z8619 Personal history of other infectious and parasitic diseases: Secondary | ICD-10-CM | POA: Insufficient documentation

## 2015-03-01 MED ORDER — IBUPROFEN 800 MG PO TABS
800.0000 mg | ORAL_TABLET | Freq: Once | ORAL | Status: AC
  Administered 2015-03-01: 800 mg via ORAL
  Filled 2015-03-01: qty 1

## 2015-03-01 MED ORDER — ALBUTEROL SULFATE (2.5 MG/3ML) 0.083% IN NEBU
5.0000 mg | INHALATION_SOLUTION | Freq: Once | RESPIRATORY_TRACT | Status: AC
  Administered 2015-03-01: 5 mg via RESPIRATORY_TRACT
  Filled 2015-03-01: qty 6

## 2015-03-01 MED ORDER — ALBUTEROL SULFATE HFA 108 (90 BASE) MCG/ACT IN AERS: 1.0000 | INHALATION_SPRAY | Freq: Four times a day (QID) | RESPIRATORY_TRACT | Status: DC | PRN

## 2015-03-01 MED ORDER — BENZONATATE 100 MG PO CAPS: 100.0000 mg | ORAL_CAPSULE | Freq: Three times a day (TID) | ORAL | Status: DC

## 2015-03-01 MED ORDER — IBUPROFEN 800 MG PO TABS: 800.0000 mg | ORAL_TABLET | Freq: Three times a day (TID) | ORAL | Status: DC

## 2015-03-01 NOTE — ED Provider Notes (Signed)
CSN: [id number]     Arrival date & time 03/01/15  1821 History  By signing my name below, I, Soijett Blue, attest that this documentation has been prepared under the direction and in the presence of Sharilyn Sites, PA-C Electronically Signed: Soijett Blue, ED Scribe. 03/01/2015. 6:37 PM.   Chief Complaint  Patient presents with  . Cough     The history is provided by the patient. No language interpreter was used.    Traci King is a 24 y.o. female who presents to the Emergency Department complaining of productive cough onset 2 weeks. She notes that her cough is productive of white sputum. She reports that it feel like her chest is rattling and that began 3-4 days ago. She also states that she does not feel like she is breathing properly as she should but does not feel SOB. She states that she is having associated symptoms of generalized body aches. She states that she has tried tylenol, motrin, mucinex, and theraflu with no relief for her symptoms. She denies fever, chills, color change, rash, wound, and any other symptoms. She denies sick contacts at this time. Denies asthma hx of lung issues.  VSS.   Past Medical History  Diagnosis Date  . History of stomach ulcers   . Abnormal Pap smear   . Herpes simplex type 1 infection    Past Surgical History  Procedure Laterality Date  . No past surgeries     Family History  Problem Relation Age of Onset  . Hypertension Mother   . Diabetes Mother   . Hypertension Maternal Grandmother   . Diabetes Maternal Grandfather    Social History  Substance Use Topics  . Smoking status: Light Tobacco Smoker -- 0.25 packs/day    Types: Cigarettes    Last Attempt to Quit: 04/09/2011  . Smokeless tobacco: Never Used  . Alcohol Use: No   OB History    Gravida Para Term Preterm AB TAB SAB Ectopic Multiple Living       Review of Systems  Constitutional: Negative for fever and chills.  Respiratory: Positive for cough  (productive).   Musculoskeletal: Positive for myalgias.  Skin: Negative for color change, rash and wound.  All other systems reviewed and are negative.     Allergies  Review of patient's allergies indicates no known allergies.  Home Medications   Prior to Admission medications   Medication Sig Start Date End Date Taking? Authorizing Provider  diazepam (VALIUM) 5 MG tablet Take 1 tablet (5 mg total) by mouth every 8 (eight) hours as needed for anxiety. 04/28/13   Tatyana Kirichenko, PA-C  etonogestrel (IMPLANON) 68 MG IMPL implant Inject 1 each into the skin once.    Historical Provider, MD  HYDROcodone-acetaminophen (NORCO) 5-325 MG per tablet Take 1 tablet by mouth every 6 (six) hours as needed. 04/28/13   Tatyana Kirichenko, PA-C  ibuprofen (ADVIL,MOTRIN) 400 MG tablet Take 400 mg by mouth every 6 (six) hours as needed. For pain    Historical Provider, MD  ibuprofen (ADVIL,MOTRIN) 600 MG tablet Take 1 tablet (600 mg total) by mouth every 6 (six) hours as needed. 04/28/13   Tatyana Kirichenko, PA-C   BP 110/65 mmHg  Pulse 87  Temp(Src) 99.3 F (37.4 C) (Oral)  Resp 20  Ht  (1.575 m)  Wt 116 lb (52.617 kg)  BMI 21.21 kg/m2  SpO2 100%   Physical Exam  Constitutional: She is oriented to  person, place, and time. She appears well-developed and well-nourished. No distress.  HENT:  Head: Normocephalic and atraumatic.  Right Ear: Tympanic membrane, external ear and ear canal normal.  Left Ear: Tympanic membrane, external ear and ear canal normal.  Nose: Nose normal.  Mouth/Throat: Uvula is midline, oropharynx is clear and moist and mucous membranes are normal. No oropharyngeal exudate, posterior oropharyngeal edema, posterior oropharyngeal erythema or tonsillar abscesses.  Eyes: EOM are normal.  Neck: Neck supple.  Cardiovascular: Normal rate, regular rhythm and normal heart sounds.  Exam reveals no gallop and no friction rub.   No murmur heard. Pulmonary/Chest: Effort normal.  No respiratory distress. She has rhonchi.  Scattered rhonchi with no distress noted.  Musculoskeletal: Normal range of motion.  Neurological: She is alert and oriented to person, place, and time.  Skin: Skin is warm and dry.  Psychiatric: She has a normal mood and affect. Her behavior is normal.  Nursing note and vitals reviewed.   ED Course  Procedures (including critical care time) DIAGNOSTIC STUDIES: Oxygen Saturation is 100% on RA, nl by my interpretation.    COORDINATION OF CARE: 6:35 PM Discussed treatment plan with pt at bedside which includes breathing treatment and CXR and pt agreed to plan.    Labs Review Labs Reviewed - No data to display  Imaging Review Dg Chest 2 View  03/01/2015   CLINICAL DATA:  Cough and body aches for 2 weeks.  EXAM: CHEST  2 VIEW  COMPARISON:  01/22/2013; 10/02/2012; 12/09/2010  FINDINGS: Grossly unchanged cardiac silhouette and mediastinal contours. No focal parenchymal opacities. No pleural effusion or pneumothorax. No evidence of edema. No acute osseus abnormalities. 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  IMPRESSION: No acute cardiopulmonary disease. Specifically, no evidence of pneumonia.   Electronically Signed   By: Simonne Come M.D.   On: 03/01/2015 19:11   I have personally reviewed and evaluated these images as part of my medical decision-making.   EKG Interpretation None      MDM   Final diagnoses:  Cough   24 year old female here with 2 weeks of cough. Patient is afebrile, nontoxic. She does have scattered rhonchi, but is in no acute respiratory distress. She has no history of asthma or other respiratory illnesses. Remainder of exam is benign. Chest x-ray without evidence of pneumonia. Patient was given albuterol treatment with improvement of her symptoms. I suspect her symptoms are viral in nature, likely bronchitis. Doubt ACS, PE, dissection, or other acute cardiac event. Patient we discharged home with  Tessalon Perles, albuterol, and Motrin. Discussed supportive care at home including rest and fluids. Patient to follow-up with her PCP.  Discussed plan with patient, he/she acknowledged understanding and agreed with plan of care.  Return precautions given for new or worsening symptoms.  I personally performed the services described in this documentation, which was scribed in my presence. The recorded information has been reviewed and is accurate.   Garlon Hatchet, PA-C 03/01/15 1941  Marily Memos, MD 03/02/15 [phone]

## 2015-03-01 NOTE — ED Notes (Signed)
Albuterol given to Resp.Therapist.

## 2015-03-01 NOTE — ED Notes (Signed)
PA at bedside.

## 2015-03-01 NOTE — Discharge Instructions (Signed)
Take the prescribed medication as directed. Rest and drink fluids. Follow-up with your primary care physician. Return to the ED for new or worsening symptoms.

## 2015-03-01 NOTE — ED Notes (Signed)
Pt states she as had cough and body aches x2 weeks. States that she has taken mucinex, theraflu and tylenol/motrin with no relief. Coughing up white sputum. Denies fever.

## 2015-10-15 ENCOUNTER — Encounter (HOSPITAL_COMMUNITY): Payer: Self-pay | Admitting: Emergency Medicine

## 2015-10-15 ENCOUNTER — Emergency Department (HOSPITAL_COMMUNITY)
Admission: EM | Admit: 2015-10-15 | Discharge: 2015-10-16 | Disposition: A | Discharge status: Home / Self Care | Payer: 59 | Attending: Emergency Medicine | Admitting: Emergency Medicine

## 2015-10-15 DIAGNOSIS — Z79899 Other long term (current) drug therapy: Secondary | ICD-10-CM | POA: Insufficient documentation

## 2015-10-15 DIAGNOSIS — L739 Follicular disorder, unspecified: Secondary | ICD-10-CM | POA: Insufficient documentation

## 2015-10-15 DIAGNOSIS — Z79891 Long term (current) use of opiate analgesic: Secondary | ICD-10-CM | POA: Insufficient documentation

## 2015-10-15 DIAGNOSIS — F17210 Nicotine dependence, cigarettes, uncomplicated: Secondary | ICD-10-CM | POA: Diagnosis not present

## 2015-10-15 DIAGNOSIS — L02212 Cutaneous abscess of back [any part, except buttock]: Secondary | ICD-10-CM | POA: Insufficient documentation

## 2015-10-15 DIAGNOSIS — L03114 Cellulitis of left upper limb: Secondary | ICD-10-CM | POA: Diagnosis present

## 2015-10-15 NOTE — ED Notes (Signed)
Pt states that she has an area on her L shoulder that is painful. Not draining. Alert and oriented. Duration 3-4 days.

## 2015-10-16 MED ORDER — LIDOCAINE HCL (PF) 1 % IJ SOLN
5.0000 mL | Freq: Once | INTRAMUSCULAR | Status: AC
  Administered 2015-10-16: 5 mL
  Filled 2015-10-16: qty 30

## 2015-10-16 MED ORDER — SULFAMETHOXAZOLE-TRIMETHOPRIM 800-160 MG PO TABS: 1.0000 | ORAL_TABLET | Freq: Two times a day (BID) | ORAL | Status: AC

## 2015-10-16 MED ORDER — TRAMADOL HCL 50 MG PO TABS
50.0000 mg | ORAL_TABLET | Freq: Four times a day (QID) | ORAL | Status: DC | PRN
Start: 2015-10-16 — End: 2017-05-22

## 2015-10-16 NOTE — ED Provider Notes (Signed)
CSN: [id number]     Arrival date & time 10/15/15  2235 History  By signing my name below, I, Traci King, attest that this documentation has been prepared under the direction and in the presence of Traci Crease, MD. Electronically Signed: Bethel King, ED Scribe. 10/16/2015. 1:22 AM   Chief Complaint  Patient presents with  . Cellulitis   The history is provided by the patient. No language interpreter was used.   Traci King is a 25 y.o. female who presents to the Emergency Department complaining of a new area of constant, 8/10 in severity, constant pain swelling at the posterior left shoulder with onset 3 days ago. She applied a warm compress and "fat back" meat to the area with no pain improvement.  Pt denies fever, chills, and other lesions.  Past Medical History  Diagnosis Date  . History of stomach ulcers   . Abnormal Pap smear   . Herpes simplex type 1 infection    Past Surgical History  Procedure Laterality Date  . No past surgeries     Family History  Problem Relation Age of Onset  . Hypertension Mother   . Diabetes Mother   . Hypertension Maternal Grandmother   . Diabetes Maternal Grandfather    Social History  Substance Use Topics  . Smoking status: Light Tobacco Smoker -- 0.25 packs/day    Types: Cigarettes    Last Attempt to Quit: 04/09/2011  . Smokeless tobacco: Never Used  . Alcohol Use: No   OB History    Gravida Para Term Preterm AB TAB SAB Ectopic Multiple Living       Review of Systems  Constitutional: Negative for fever and chills.  Skin:       Painful area of swelling at the left shoulder  All other systems reviewed and are negative.   Allergies  Review of patient's allergies indicates no known allergies.  Home Medications   Prior to Admission medications   Medication Sig Start Date End Date Taking? Authorizing Provider  albuterol (PROVENTIL HFA;VENTOLIN HFA) 108 (90 BASE) MCG/ACT inhaler Inhale 1-2  puffs into the lungs every 6 (six) hours as needed for wheezing. 03/01/15   Garlon Hatchet, PA-C  benzonatate (TESSALON) 100 MG capsule Take 1 capsule (100 mg total) by mouth every 8 (eight) hours. 03/01/15   Garlon Hatchet, PA-C  diazepam (VALIUM) 5 MG tablet Take 1 tablet (5 mg total) by mouth every 8 (eight) hours as needed for anxiety. 04/28/13   Tatyana Kirichenko, PA-C  etonogestrel (IMPLANON) 68 MG IMPL implant Inject 1 each into the skin once.    Historical Provider, MD  HYDROcodone-acetaminophen (NORCO) 5-325 MG per tablet Take 1 tablet by mouth every 6 (six) hours as needed. 04/28/13   Tatyana Kirichenko, PA-C  ibuprofen (ADVIL,MOTRIN) 800 MG tablet Take 1 tablet (800 mg total) by mouth 3 (three) times daily. 03/01/15   Garlon Hatchet, PA-C  sulfamethoxazole-trimethoprim (BACTRIM DS,SEPTRA DS) 800-160 MG tablet Take 1 tablet by mouth 2 (two) times daily. 10/16/15 10/23/15  Traci Crease, MD  traMADol (ULTRAM) 50 MG tablet Take 1 tablet (50 mg total) by mouth every 6 (six) hours as needed. 10/16/15   Traci Crease, MD   BP 119/86 mmHg  Pulse 77  Temp(Src) 98.6 F (37 C) (Oral)  Resp 18  SpO2 99% Physical Exam  Constitutional: She is oriented to person, place, and time. She appears well-developed and well-nourished. No  distress.  HENT:  Head: Normocephalic and atraumatic.  Right Ear: Hearing normal.  Left Ear: Hearing normal.  Nose: Nose normal.  Mouth/Throat: Oropharynx is clear and moist and mucous membranes are normal.  Eyes: Conjunctivae and EOM are normal. Pupils are equal, round, and reactive to light.  Neck: Normal range of motion. Neck supple.  Cardiovascular: Regular rhythm, S1 normal and S2 normal.  Exam reveals no gallop and no friction rub.   No murmur heard. Pulmonary/Chest: Effort normal and breath sounds normal. No respiratory distress. She exhibits no tenderness.  Abdominal: Soft. Normal appearance and bowel sounds are normal. There is no  hepatosplenomegaly. There is no tenderness. There is no rebound, no guarding, no tenderness at McBurney's point and negative Murphy's sign. No hernia.  Musculoskeletal: Normal range of motion.  Neurological: She is alert and oriented to person, place, and time. She has normal strength. No cranial nerve deficit or sensory deficit. Coordination normal. GCS eye subscore is 4. GCS verbal subscore is 5. GCS motor subscore is 6.  Skin: Skin is warm, dry and intact. No cyanosis.  1.5 cm tender, fluctuant area at the left scapular area with overlying erythema and no induration  Psychiatric: She has a normal mood and affect. Her speech is normal and behavior is normal. Thought content normal.  Nursing note and vitals reviewed.   ED Course  Procedures (including critical care time)  INCISION AND DRAINAGE Performed by: Traci CreasePOLLINA, Loreto Loescher J. Consent: Verbal consent obtained. Risks and benefits: risks, benefits and alternatives were discussed Type: abscess  Body area: back  Anesthesia: local infiltration  Incision was made with a scalpel.  Local anesthetic: lidocaine 1% without epinephrine  Anesthetic total: 2 ml  Complexity: complex Blunt dissection to break up loculations  Drainage: purulent  Drainage amount: small  Packing material: 1/2 in iodoform gauze  Patient tolerance: Patient tolerated the procedure well with no immediate complications.     DIAGNOSTIC STUDIES: Oxygen Saturation is 99% on RA,  normal by my interpretation.    COORDINATION OF CARE: 1:16 AM Discussed treatment plan which includes I&D and abx with pt at bedside and pt agreed to plan.  Labs Review Labs Reviewed - No data to display  Imaging Review No results found.    EKG Interpretation None      MDM   Final diagnoses:  Cutaneous abscess of back excluding buttocks  Folliculitis    Presents to the ER for evaluation of painful nodule on her back that is consistent with early skin abscess. She  has several other small scattered erythematous papules on her back as well consistent with folliculitis. Incision and drainage performed, will initiate Bactroban and analgesia. Patient is calm, removing the packing herself at home. She was counseled to return if she has difficulty.  I personally performed the services described in this documentation, which was scribed in my presence. The recorded information has been reviewed and is accurate.   Traci Creasehristopher J Anesha Hackert, MD 10/16/15 [phone]

## 2015-10-16 NOTE — Discharge Instructions (Signed)
Abscess An abscess is an infected area that contains a collection of pus and debris.It can occur in almost any part of the body. An abscess is also known as a furuncle or boil. CAUSES  An abscess occurs when tissue gets infected. This can occur from blockage of oil or sweat glands, infection of hair follicles, or a minor injury to the skin. As the body tries to fight the infection, pus collects in the area and creates pressure under the skin. This pressure causes pain. People with weakened immune systems have difficulty fighting infections and get certain abscesses more often.  SYMPTOMS Usually an abscess develops on the skin and becomes a painful mass that is red, warm, and tender. If the abscess forms under the skin, you may feel a moveable soft area under the skin. Some abscesses break open (rupture) on their own, but most will continue to get worse without care. The infection can spread deeper into the body and eventually into the bloodstream, causing you to feel ill.  DIAGNOSIS  Your caregiver will take your medical history and perform a physical exam. A sample of fluid may also be taken from the abscess to determine what is causing your infection. TREATMENT  Your caregiver may prescribe antibiotic medicines to fight the infection. However, taking antibiotics alone usually does not cure an abscess. Your caregiver may need to make a small cut (incision) in the abscess to drain the pus. In some cases, gauze is packed into the abscess to reduce pain and to continue draining the area. HOME CARE INSTRUCTIONS   Only take over-the-counter or prescription medicines for pain, discomfort, or fever as directed by your caregiver.  If you were prescribed antibiotics, take them as directed. Finish them even if you start to feel better.  If gauze is used, follow your caregiver's directions for changing the gauze.  To avoid spreading the infection:  Keep your draining abscess covered with a  bandage.  Wash your hands well.  Do not share personal care items, towels, or whirlpools with others.  Avoid skin contact with others.  Keep your skin and clothes clean around the abscess.  Keep all follow-up appointments as directed by your caregiver. SEEK MEDICAL CARE IF:   You have increased pain, swelling, redness, fluid drainage, or bleeding.  You have muscle aches, chills, or a general ill feeling.  You have a fever. MAKE SURE YOU:   Understand these instructions.  Will watch your condition.  Will get help right away if you are not doing well or get worse.   This information is not intended to replace advice given to you by your health care provider. Make sure you discuss any questions you have with your health care provider.   Document Released: 02/20/2005 Document Revised: 11/12/2011 Document Reviewed: 07/26/2011 Elsevier Interactive Patient Education 2016 Elsevier Inc.  Folliculitis Folliculitis is redness, soreness, and swelling (inflammation) of the hair follicles. This condition can occur anywhere on the body. People with weakened immune systems, diabetes, or obesity have a greater risk of getting folliculitis. CAUSES  Bacterial infection. This is the most common cause.  Fungal infection.  Viral infection.  Contact with certain chemicals, especially oils and tars. Long-term folliculitis can result from bacteria that live in the nostrils. The bacteria may trigger multiple outbreaks of folliculitis over time. SYMPTOMS Folliculitis most commonly occurs on the scalp, thighs, legs, back, buttocks, and areas where hair is shaved frequently. An early sign of folliculitis is a small, white or yellow, pus-filled, itchy lesion (  pustule). These lesions appear on a red, inflamed follicle. They are usually less than 0.2 inches (5 mm) wide. When there is an infection of the follicle that goes deeper, it becomes a boil or furuncle. A group of closely packed boils creates a  larger lesion (carbuncle). Carbuncles tend to occur in hairy, sweaty areas of the body. DIAGNOSIS  Your caregiver can usually tell what is wrong by doing a physical exam. A sample may be taken from one of the lesions and tested in a lab. This can help determine what is causing your folliculitis. TREATMENT  Treatment may include:  Applying warm compresses to the affected areas.  Taking antibiotic medicines orally or applying them to the skin.  Draining the lesions if they contain a large amount of pus or fluid.  Laser hair removal for cases of long-lasting folliculitis. This helps to prevent regrowth of the hair. HOME CARE INSTRUCTIONS  Apply warm compresses to the affected areas as directed by your caregiver.  If antibiotics are prescribed, take them as directed. Finish them even if you start to feel better.  You may take over-the-counter medicines to relieve itching.  Do not shave irritated skin.  Follow up with your caregiver as directed. SEEK IMMEDIATE MEDICAL CARE IF:   You have increasing redness, swelling, or pain in the affected area.  You have a fever. MAKE SURE YOU:  Understand these instructions.  Will watch your condition.  Will get help right away if you are not doing well or get worse.   This information is not intended to replace advice given to you by your health care provider. Make sure you discuss any questions you have with your health care provider.   Document Released: 07/22/2001 Document Revised: 06/03/2014 Document Reviewed: 08/13/2011 Elsevier Interactive Patient Education [employer]

## 2015-12-19 ENCOUNTER — Encounter (HOSPITAL_COMMUNITY): Payer: Self-pay | Admitting: Emergency Medicine

## 2015-12-19 ENCOUNTER — Ambulatory Visit (HOSPITAL_COMMUNITY)
Admission: EM | Admit: 2015-12-19 | Discharge: 2015-12-19 | Disposition: A | Discharge status: Home / Self Care | Payer: 59 | Attending: Physician Assistant | Admitting: Physician Assistant

## 2015-12-19 DIAGNOSIS — L02212 Cutaneous abscess of back [any part, except buttock]: Secondary | ICD-10-CM | POA: Diagnosis not present

## 2015-12-19 DIAGNOSIS — L0291 Cutaneous abscess, unspecified: Secondary | ICD-10-CM | POA: Diagnosis not present

## 2015-12-19 MED ORDER — SULFAMETHOXAZOLE-TRIMETHOPRIM 800-160 MG PO TABS: 1.0000 | ORAL_TABLET | Freq: Two times a day (BID) | ORAL | 0 refills | Status: AC

## 2015-12-19 NOTE — ED Triage Notes (Signed)
The patient presented to the [facility] with a complaint of a possible abscess on her chest below her right breast.

## 2015-12-19 NOTE — ED Provider Notes (Signed)
CSN: [id number]     Arrival date & time 12/19/15  1054 History   First MD Initiated Contact with Patient 12/19/15 1158     Chief Complaint  Patient presents with  . Abscess   (Consider location/radiation/quality/duration/timing/severity/associated sxs/prior Treatment) HPI History obtained from patient:  Pt presents with the cc of:  Abscess formation chest Duration of symptoms: Several days Treatment prior to arrival: No treatment Context: Previous history of abscess on her back a few months ago. Does not recall that she was treated for MRSA. States that she works in [employer]. Denies any allergies at this time. Other symptoms include: No other symptoms. Pain score: 4 FAMILY HISTORY: Hypertension    Past Medical History:  Diagnosis Date  . Abnormal Pap smear   . Herpes simplex type 1 infection   . History of stomach ulcers    Past Surgical History:  Procedure Laterality Date  . NO PAST SURGERIES     Family History  Problem Relation Age of Onset  . Hypertension Mother   . Diabetes Mother   . Hypertension Maternal Grandmother   . Diabetes Maternal Grandfather    Social History  Substance Use Topics  . Smoking status: Light Tobacco Smoker    Packs/day: 0.25    Types: Cigarettes    Last attempt to quit: 04/09/2011  . Smokeless tobacco: Never Used  . Alcohol use No   OB History    Gravida Para Term Preterm AB Living   1 1 0 1 0 1   SAB TAB Ectopic Multiple Live Births   0 0 0 0       Review of Systems  Denies: HEADACHE, NAUSEA, ABDOMINAL PAIN, CHEST PAIN, CONGESTION, DYSURIA, SHORTNESS OF BREATH  Allergies  Review of patient's allergies indicates no known allergies.  Home Medications   Prior to Admission medications   Medication Sig Start Date End Date Taking? Authorizing Provider  albuterol (PROVENTIL HFA;VENTOLIN HFA) 108 (90 BASE) MCG/ACT inhaler Inhale 1-2 puffs into the lungs every 6 (six) hours as needed for wheezing. 03/01/15   Garlon Hatchet, PA-C   benzonatate (TESSALON) 100 MG capsule Take 1 capsule (100 mg total) by mouth every 8 (eight) hours. 03/01/15   Garlon Hatchet, PA-C  diazepam (VALIUM) 5 MG tablet Take 1 tablet (5 mg total) by mouth every 8 (eight) hours as needed for anxiety. 04/28/13   Tatyana Kirichenko, PA-C  etonogestrel (IMPLANON) 68 MG IMPL implant Inject 1 each into the skin once.    Historical Provider, MD  HYDROcodone-acetaminophen (NORCO) 5-325 MG per tablet Take 1 tablet by mouth every 6 (six) hours as needed. 04/28/13   Tatyana Kirichenko, PA-C  ibuprofen (ADVIL,MOTRIN) 800 MG tablet Take 1 tablet (800 mg total) by mouth 3 (three) times daily. 03/01/15   Garlon Hatchet, PA-C  sulfamethoxazole-trimethoprim (BACTRIM DS,SEPTRA DS) 800-160 MG tablet Take 1 tablet by mouth 2 (two) times daily. 12/19/15 12/26/15  Tharon Aquas, PA  traMADol (ULTRAM) 50 MG tablet Take 1 tablet (50 mg total) by mouth every 6 (six) hours as needed. 10/16/15   Gilda Crease, MD   Meds Ordered and Administered this Visit  Medications - No data to display  BP 125/78 (BP Location: Right Arm)   Pulse 60   Temp 99.3 F (37.4 C) (Oral)   Ht  (1.6 m)   Wt 110 lb (49.9 kg)   SpO2 100%   BMI 19.49 kg/m  No data found.   Physical Exam NURSES NOTES AND VITAL  SIGNS REVIEWED. CONSTITUTIONAL: Well developed, well nourished, no acute distress HEENT: normocephalic, atraumatic EYES: Conjunctiva normal NECK:normal ROM, supple, no adenopathy PULMONARY:No respiratory distress, normal effort ABDOMINAL: Soft, ND, NT BS+, No CVAT, upper abdominal wall just below the xiphoid process is a small 1.5 cm tender fluctuant lesion with redness but no signs of cellulitis. MUSCULOSKELETAL: Normal ROM of all extremities,  SKIN: warm and dry without rash PSYCHIATRIC: Mood and affect, behavior are normal  Urgent Care Course   Clinical Course    .Marland KitchenIncision and Drainage Date/Time: 12/19/2015 12:57 PM Performed by: Tharon Aquas Authorized by:  Barbra Sarks C   Consent:    Consent obtained:  Verbal   Consent given by:  Patient   Risks discussed:  Incomplete drainage, infection and pain   Alternatives discussed:  No treatment Location:    Type:  Abscess   Location:  Trunk   Trunk location:  Abdomen Pre-procedure details:    Skin preparation:  Antiseptic wash and Betadine Anesthesia (see MAR for exact dosages):    Anesthesia method:  Local infiltration   Local anesthetic:  Lidocaine 1% w/o epi Procedure type:    Complexity:  Simple Procedure details:    Needle aspiration: no     Incision types:  Single straight   Incision depth:  Subcutaneous   Scalpel blade:  11   Wound management:  Probed and deloculated   Drainage:  Purulent   Drainage amount:  Moderate   Wound treatment:  Drain placed   Packing materials:  1/4 in gauze and 1/4 in iodoform gauze Post-procedure details:    Patient tolerance of procedure:  Tolerated well, no immediate complications   (including critical care time)  Labs Review Labs Reviewed  AEROBIC CULTURE (SUPERFICIAL SPECIMEN)   Pending Imaging Review No results found.   Visual Acuity Review  Right Eye Distance:   Left Eye Distance:   Bilateral Distance:    Right Eye Near:   Left Eye Near:    Bilateral Near:        Prescription for Bactrim sent to patient's pharmacy return to work note provided instructions of care provided MDM   1. Abscess     Patient is reassured that there are no issues that require transfer to higher level of care at this time or additional tests. Patient is advised to continue home symptomatic treatment. Patient is advised that if there are new or worsening symptoms to attend the emergency department, contact primary care provider, or return to UC. Instructions of care provided discharged home in stable condition.    THIS NOTE WAS GENERATED USING A VOICE RECOGNITION SOFTWARE PROGRAM. ALL REASONABLE EFFORTS  WERE MADE TO PROOFREAD THIS DOCUMENT FOR  ACCURACY.  I have verbally reviewed the discharge instructions with the patient. A printed AVS was given to the patient.  All questions were answered prior to discharge.      Tharon Aquas, PA 12/19/15 1258

## 2015-12-21 LAB — AEROBIC CULTURE  (SUPERFICIAL SPECIMEN)

## 2015-12-21 LAB — AEROBIC CULTURE W GRAM STAIN (SUPERFICIAL SPECIMEN): Culture: NORMAL

## 2016-10-12 ENCOUNTER — Emergency Department (HOSPITAL_COMMUNITY)
Admission: EM | Admit: 2016-10-12 | Discharge: 2016-10-12 | Disposition: A | Discharge status: Home / Self Care | Payer: 59 | Attending: Emergency Medicine | Admitting: Emergency Medicine

## 2016-10-12 ENCOUNTER — Encounter (HOSPITAL_COMMUNITY): Payer: Self-pay | Admitting: *Deleted

## 2016-10-12 DIAGNOSIS — L02213 Cutaneous abscess of chest wall: Secondary | ICD-10-CM | POA: Diagnosis present

## 2016-10-12 DIAGNOSIS — F17200 Nicotine dependence, unspecified, uncomplicated: Secondary | ICD-10-CM | POA: Insufficient documentation

## 2016-10-12 DIAGNOSIS — Z79899 Other long term (current) drug therapy: Secondary | ICD-10-CM | POA: Insufficient documentation

## 2016-10-12 DIAGNOSIS — L0291 Cutaneous abscess, unspecified: Secondary | ICD-10-CM

## 2016-10-12 MED ORDER — LIDOCAINE HCL 1 % IJ SOLN
INTRAMUSCULAR | Status: AC
  Administered 2016-10-12: 5 mL
  Filled 2016-10-12: qty 20

## 2016-10-12 MED ORDER — LIDOCAINE-EPINEPHRINE-TETRACAINE (LET) SOLUTION
3.0000 mL | Freq: Once | NASAL | Status: AC
  Administered 2016-10-12: 3 mL via TOPICAL
  Filled 2016-10-12: qty 3

## 2016-10-12 MED ORDER — SULFAMETHOXAZOLE-TRIMETHOPRIM 800-160 MG PO TABS
1.0000 | ORAL_TABLET | Freq: Once | ORAL | Status: AC
  Administered 2016-10-12: 1 via ORAL
  Filled 2016-10-12: qty 1

## 2016-10-12 MED ORDER — LIDOCAINE HCL (PF) 1 % IJ SOLN: 5.0000 mL | Freq: Once | INTRAMUSCULAR | Status: DC

## 2016-10-12 MED ORDER — SULFAMETHOXAZOLE-TRIMETHOPRIM 800-160 MG PO TABS: 1.0000 | ORAL_TABLET | Freq: Two times a day (BID) | ORAL | 0 refills | Status: AC

## 2016-10-12 NOTE — ED Triage Notes (Signed)
Midsternal abscess started Wed. Pt noted to have quarter size abscess with redness around edges. Painful to touch.

## 2016-10-12 NOTE — ED Provider Notes (Signed)
[facility]-EMERGENCY DEPT Provider Note   CSN: [id number] Arrival date & time: 10/12/16  [id number]     History   Chief Complaint Chief Complaint  Patient presents with  . Abscess    HPI Traci King is a 26 y.o. female.  HPI  Patient presents with concern of a chest wall lesion. Over the past 3 days she's noticed increasing erythematous area in her mid sternal area. There is concurrent increasing pain throughout the upper chest, but no new fever, chills, nausea, vomiting. She notes that she has had similar events in the past. Patient has substantial chest hair, notes that she typically plucks or shaves the area.   Past Medical History:  Diagnosis Date  . Abnormal Pap smear   . Herpes simplex type 1 infection   . History of stomach ulcers     There are no active problems to display for this patient.   Past Surgical History:  Procedure Laterality Date  . NO PAST SURGERIES      OB History    Gravida Para Term Preterm AB Living   1 1 0 1 0 1   SAB TAB Ectopic Multiple Live Births   0 0 0 0 1       Home Medications    Prior to Admission medications   Medication Sig Start Date End Date Taking? Authorizing Provider  albuterol (PROVENTIL HFA;VENTOLIN HFA) 108 (90 BASE) MCG/ACT inhaler Inhale 1-2 puffs into the lungs every 6 (six) hours as needed for wheezing. 03/01/15   Garlon HatchetSanders, Lisa M, PA-C  benzonatate (TESSALON) 100 MG capsule Take 1 capsule (100 mg total) by mouth every 8 (eight) hours. 03/01/15   Garlon HatchetSanders, Lisa M, PA-C  diazepam (VALIUM) 5 MG tablet Take 1 tablet (5 mg total) by mouth every 8 (eight) hours as needed for anxiety. 04/28/13   Kirichenko, Lemont Fillersatyana, PA-C  etonogestrel (IMPLANON) 68 MG IMPL implant Inject 1 each into the skin once.    [provider]  HYDROcodone-acetaminophen (NORCO) 5-325 MG per tablet Take 1 tablet by mouth every 6 (six) hours as needed. 04/28/13   Kirichenko, Lemont Fillersatyana, PA-C  ibuprofen (ADVIL,MOTRIN) 800 MG tablet Take 1 tablet (800  mg total) by mouth 3 (three) times daily. 03/01/15   Garlon HatchetSanders, Lisa M, PA-C  traMADol (ULTRAM) 50 MG tablet Take 1 tablet (50 mg total) by mouth every 6 (six) hours as needed. 10/16/15   Gilda CreasePollina, Christopher J, MD    Family History Family History  Problem Relation Age of Onset  . Hypertension Mother   . Diabetes Mother   . Hypertension Maternal Grandmother   . Diabetes Maternal Grandfather     Social History Social History  Substance Use Topics  . Smoking status: Light Tobacco Smoker    Packs/day: 0.25    Last attempt to quit: 04/09/2011  . Smokeless tobacco: Never Used  . Alcohol use No     Allergies   Patient has no known allergies.   Review of Systems Review of Systems  Constitutional:       Per HPI, otherwise negative  HENT:       Per HPI, otherwise negative  Respiratory:       Per HPI, otherwise negative  Cardiovascular:       Per HPI, otherwise negative  Gastrointestinal: Negative for vomiting.  Endocrine:       Negative aside from HPI  Genitourinary:       Neg aside from HPI   Musculoskeletal:       Per HPI,  otherwise negative  Skin: Positive for wound.  Neurological: Negative for syncope.     Physical Exam Updated Vital Signs BP 122/81 (BP Location: Left Arm)   Pulse 85   Temp 98.4 F (36.9 C) (Oral)   Resp 18   Ht 5\' 3"  (1.6 m)   Wt 110 lb (49.9 kg)   SpO2 100%   BMI 19.49 kg/m   Physical Exam  Constitutional: She is oriented to person, place, and time. She appears well-developed and well-nourished. No distress.  HENT:  Head: Normocephalic and atraumatic.  Eyes: Conjunctivae and EOM are normal.  Cardiovascular: Normal rate and regular rhythm.   Pulmonary/Chest: Effort normal and breath sounds normal. No stridor. No respiratory distress.    Abdominal: She exhibits no distension.  Musculoskeletal: She exhibits no edema.  Neurological: She is alert and oriented to person, place, and time. No cranial nerve deficit.  Skin: Skin is warm and  dry.  Psychiatric: She has a normal mood and affect.  Nursing note and vitals reviewed.    ED Treatments / Results  Cardiac monitor w SR 70's, unremarkable.   Procedures Procedures (including critical care time)  Medications Ordered in ED Medications  lidocaine (PF) (XYLOCAINE) 1 % injection 5 mL (not administered)  lidocaine-EPINEPHrine-tetracaine (LET) solution (3 mLs Topical Given 10/12/16 0733)     Initial Impression / Assessment and Plan / ED Course  I have reviewed the triage vital signs and the nursing notes.  Pertinent labs & imaging results that were available during my care of the patient were reviewed by me and considered in my medical decision making (see chart for details).  INCISION AND DRAINAGE Performed by: Gerhard Munch Consent: Verbal consent obtained. Risks and benefits: risks, benefits and alternatives were discussed Type: abscess  Body area: chest wall  Anesthesia: local infiltration  Incision was made with a scalpel.  Local anesthetic: lidocaine 1% no epinephrine  Anesthetic total: 2 ml (after LET application for 15m)  Complexity: complex Blunt dissection to break up loculations  Drainage: purulent  Drainage amount: 2ml thick nearly solid and malodorous  Patient tolerance: Patient tolerated the procedure well with no immediate complications.   After drainage of all material, with breaking up of the thick internal POSS, I irrigated the incision. We had a lengthy conversation in wound care, monitoring, consideration of alternatives for hair removal. Patient discharged to person antibiotics after successful incision and drainage of chest wall abscess, with no evidence for bacteremia or sepsis.  Final Clinical Impressions(s) / ED Diagnoses  Chest wall abscess   Gerhard Munch, MD 10/12/16 [phone]

## 2016-10-12 NOTE — ED Triage Notes (Signed)
Dr Jeraldine LootsLockwood in to lance abscess

## 2016-10-13 ENCOUNTER — Emergency Department (HOSPITAL_COMMUNITY)
Admission: EM | Admit: 2016-10-13 | Discharge: 2016-10-13 | Discharge status: Absent without leave | Payer: 59 | Attending: Emergency Medicine | Admitting: Emergency Medicine

## 2016-10-13 NOTE — ED Provider Notes (Signed)
10/13/16 7:12 AM Patient returns for work note, which was provided.  Quick eval show diminished size, no erythema, and clear drainage - good healing.   Gerhard MunchLockwood, Oriel Ojo, MD 10/13/16 [phone]

## 2017-05-22 ENCOUNTER — Emergency Department (HOSPITAL_COMMUNITY)
Admission: EM | Admit: 2017-05-22 | Discharge: 2017-05-22 | Disposition: A | Discharge status: Home / Self Care | Payer: 59 | Attending: Emergency Medicine | Admitting: Emergency Medicine

## 2017-05-22 ENCOUNTER — Encounter (HOSPITAL_COMMUNITY): Payer: Self-pay

## 2017-05-22 ENCOUNTER — Other Ambulatory Visit: Payer: Self-pay

## 2017-05-22 DIAGNOSIS — Z87891 Personal history of nicotine dependence: Secondary | ICD-10-CM | POA: Insufficient documentation

## 2017-05-22 DIAGNOSIS — K0889 Other specified disorders of teeth and supporting structures: Secondary | ICD-10-CM

## 2017-05-22 DIAGNOSIS — Z79899 Other long term (current) drug therapy: Secondary | ICD-10-CM | POA: Insufficient documentation

## 2017-05-22 MED ORDER — AMOXICILLIN 500 MG PO CAPS: 500.0000 mg | ORAL_CAPSULE | Freq: Three times a day (TID) | ORAL | 0 refills | Status: DC

## 2017-05-22 MED ORDER — LIDOCAINE VISCOUS 2 % MT SOLN: 20.0000 mL | OROMUCOSAL | 0 refills | Status: DC | PRN

## 2017-05-22 MED ORDER — TRAMADOL HCL 50 MG PO TABS: 50.0000 mg | ORAL_TABLET | Freq: Four times a day (QID) | ORAL | 0 refills | Status: DC | PRN

## 2017-05-22 MED ORDER — TRAMADOL HCL 50 MG PO TABS
50.0000 mg | ORAL_TABLET | Freq: Once | ORAL | Status: AC
  Administered 2017-05-22: 50 mg via ORAL
  Filled 2017-05-22: qty 1

## 2017-05-22 NOTE — ED Provider Notes (Signed)
[facility]-EMERGENCY DEPT Provider Note   CSN: [id number] Arrival date & time: 05/22/17  1010     History   Chief Complaint Chief Complaint  Patient presents with  . Dental Pain    HPI Traci King is a 26 y.o. female.  HPI A 26 year old [place] female with no pertinent past medical history presents to the emergency department today for evaluation of left dental pain.  Patient states that for the past 2 days she has had increasing pain in her left lower jaw from a tooth.  Reports some swelling.  Denies any difficulty breathing or swallowing.  States that it is difficult to eat due to the pain.  She has tried [place] powder, BC powder, ibuprofen, Tylenol with no relief.  Patient states that she needs something for pain.  Patient denies having a dentist or having follow-up.  Patient denies history of dental abscesses.  She reports feeling "hot" but has not taken her temperature.  Nothing makes her symptoms better. Past Medical History:  Diagnosis Date  . Abnormal Pap smear   . Herpes simplex type 1 infection   . History of stomach ulcers     There are no active problems to display for this patient.   Past Surgical History:  Procedure Laterality Date  . NO PAST SURGERIES      OB History    Gravida Para Term Preterm AB Living   1 1 0 1 0 1   SAB TAB Ectopic Multiple Live Births   0 0 0 0 1       Home Medications    Prior to Admission medications   Medication Sig Start Date End Date Taking? Authorizing Provider  albuterol (PROVENTIL HFA;VENTOLIN HFA) 108 (90 BASE) MCG/ACT inhaler Inhale 1-2 puffs into the lungs every 6 (six) hours as needed for wheezing. 03/01/15   Garlon HatchetSanders, Lisa M, PA-C  amoxicillin (AMOXIL) 500 MG capsule Take 1 capsule (500 mg total) by mouth 3 (three) times daily. 05/22/17   Rise MuLeaphart, Sawsan Riggio T, PA-C  benzonatate (TESSALON) 100 MG capsule Take 1 capsule (100 mg total) by mouth every 8 (eight) hours. 03/01/15   Garlon HatchetSanders,  Lisa M, PA-C  diazepam (VALIUM) 5 MG tablet Take 1 tablet (5 mg total) by mouth every 8 (eight) hours as needed for anxiety. 04/28/13   Kirichenko, Lemont Fillersatyana, PA-C  etonogestrel (IMPLANON) 68 MG IMPL implant Inject 1 each into the skin once.    [provider]  HYDROcodone-acetaminophen (NORCO) 5-325 MG per tablet Take 1 tablet by mouth every 6 (six) hours as needed. 04/28/13   Kirichenko, Lemont Fillersatyana, PA-C  ibuprofen (ADVIL,MOTRIN) 800 MG tablet Take 1 tablet (800 mg total) by mouth 3 (three) times daily. 03/01/15   Garlon HatchetSanders, Lisa M, PA-C  lidocaine (XYLOCAINE) 2 % solution Use as directed 20 mLs in the mouth or throat as needed for mouth pain. 05/22/17   Rise MuLeaphart, Alexsia Klindt T, PA-C  traMADol (ULTRAM) 50 MG tablet Take 1 tablet (50 mg total) by mouth every 6 (six) hours as needed. 05/22/17   Rise MuLeaphart, Riven Mabile T, PA-C    Family History Family History  Problem Relation Age of Onset  . Hypertension Mother   . Diabetes Mother   . Hypertension Maternal Grandmother   . Diabetes Maternal Grandfather     Social History Social History   Tobacco Use  . Smoking status: Former Smoker    Packs/day: 0.25    Last attempt to quit: 04/09/2011    Years since quitting: 6.1  .  Smokeless tobacco: Never Used  Substance Use Topics  . Alcohol use: No  . Drug use: No     Allergies   Patient has no known allergies.   Review of Systems Review of Systems  Constitutional: Positive for fever (feeling hot). Negative for chills.  HENT: Positive for dental problem. Negative for trouble swallowing.   Respiratory: Negative for shortness of breath.   Gastrointestinal: Negative for vomiting.  Musculoskeletal: Negative for neck pain and neck stiffness.  Skin: Negative for rash.     Physical Exam Updated Vital Signs BP 118/81 (BP Location: Right Arm)   Pulse 76   Temp 98 F (36.7 C) (Oral)   Resp 17   Ht 5\' 3"  (1.6 m)   Wt 53.1 kg (117 lb)   SpO2 100%   BMI 20.73 kg/m   Physical Exam    Constitutional: She appears well-developed and well-nourished. No distress.  HENT:  Head: Normocephalic and atraumatic.  Mouth/Throat: Uvula is midline, oropharynx is clear and moist and mucous membranes are normal. No trismus in the jaw. Abnormal dentition.    Oropharynx is clear.  No sublingual or submandibular swelling.  Managing secretions and tolerating airway.  Eyes: Right eye exhibits no discharge. Left eye exhibits no discharge. No scleral icterus.  Neck: Normal range of motion. Neck supple.  No c spine midline tenderness. No paraspinal tenderness. No deformities or step offs noted. Full ROM. Supple. No nuchal rigidity.    Pulmonary/Chest: No respiratory distress.  Musculoskeletal: Normal range of motion.  Neurological: She is alert.  Skin: No pallor.  Psychiatric: Her behavior is normal. Judgment and thought content normal.  Nursing note and vitals reviewed.    ED Treatments / Results  Labs (all labs ordered are listed, but only abnormal results are displayed) Labs Reviewed - No data to display  EKG  EKG Interpretation None       Radiology No results found.  Procedures Procedures (including critical care time)  Medications Ordered in ED Medications  traMADol (ULTRAM) tablet 50 mg (50 mg Oral Given 05/22/17 1124)     Initial Impression / Assessment and Plan / ED Course  I have reviewed the triage vital signs and the nursing notes.  Pertinent labs & imaging results that were available during my care of the patient were reviewed by me and considered in my medical decision making (see chart for details).     Patient with toothache.  No gross abscess.  Exam unconcerning for Ludwig's angina or spread of infection.  Will treat with penicillin and pain medicine.  Urged patient to follow-up with dentist.     Final Clinical Impressions(s) / ED Diagnoses   Final diagnoses:  Pain, dental    ED Discharge Orders        Ordered    traMADol (ULTRAM) 50 MG  tablet  Every 6 hours PRN     05/22/17 1121    lidocaine (XYLOCAINE) 2 % solution  As needed     05/22/17 1121    amoxicillin (AMOXIL) 500 MG capsule  3 times daily     05/22/17 1121       Wallace KellerLeaphart, Shelise Maron T, PA-C 05/22/17 1129    Tegeler, Canary Brimhristopher J, MD 05/23/17 [phone]

## 2017-05-22 NOTE — ED Triage Notes (Signed)
Patient c/o left lower dental pain and slight swelling x 2 days.

## 2017-05-22 NOTE — Discharge Instructions (Signed)
You have a dental injury/infection. It is very important that you get evaluated by a dentist as soon as possible. Call tomorrow to schedule an appointment. Ibuprofen and tylenol as needed for pain. Short course of Midol for pain that is not controlled by the ibuprofen or Tylenol.  This medication make you drowsy so do not drive with it.  May also use the lidocaine rinse to help numb your mouth.  Take your full course of antibiotics. Read the instructions below.  Eat a soft or liquid diet and rinse your mouth out after meals with warm water. You should see a dentist or return here at once if you have increased swelling, increased pain or uncontrolled bleeding from the site of your injury.  SEEK MEDICAL CARE IF:  You have increased pain not controlled with medicines.  You have swelling around your tooth, in your face or neck.  You have bleeding which starts, continues, or gets worse.  You have a fever >101 If you are unable to open your mouth

## 2017-05-22 NOTE — ED Notes (Signed)
Bed: WTR6 Expected date:  Expected time:  Means of arrival:  Comments: 

## 2017-05-28 DIAGNOSIS — K047 Periapical abscess without sinus: Secondary | ICD-10-CM | POA: Diagnosis not present

## 2017-05-28 DIAGNOSIS — K0889 Other specified disorders of teeth and supporting structures: Secondary | ICD-10-CM | POA: Diagnosis present

## 2017-05-28 DIAGNOSIS — Z79899 Other long term (current) drug therapy: Secondary | ICD-10-CM | POA: Insufficient documentation

## 2017-05-28 DIAGNOSIS — Z87891 Personal history of nicotine dependence: Secondary | ICD-10-CM | POA: Diagnosis not present

## 2017-05-29 ENCOUNTER — Encounter (HOSPITAL_COMMUNITY): Payer: Self-pay | Admitting: Family Medicine

## 2017-05-29 ENCOUNTER — Emergency Department (HOSPITAL_COMMUNITY)
Admission: EM | Admit: 2017-05-29 | Discharge: 2017-05-29 | Disposition: A | Discharge status: Home / Self Care | Payer: 59 | Attending: Emergency Medicine | Admitting: Emergency Medicine

## 2017-05-29 DIAGNOSIS — K047 Periapical abscess without sinus: Secondary | ICD-10-CM

## 2017-05-29 MED ORDER — ONDANSETRON 8 MG PO TBDP: ORAL_TABLET | ORAL | 0 refills | Status: DC

## 2017-05-29 MED ORDER — HYDROCODONE-ACETAMINOPHEN 7.5-325 MG/15ML PO SOLN: ORAL | 0 refills | Status: DC

## 2017-05-29 MED ORDER — HYDROMORPHONE HCL 1 MG/ML IJ SOLN
0.5000 mg | Freq: Once | INTRAMUSCULAR | Status: AC
  Administered 2017-05-29: 0.5 mg via INTRAMUSCULAR
  Filled 2017-05-29: qty 1

## 2017-05-29 MED ORDER — ONDANSETRON 8 MG PO TBDP: 8.0000 mg | ORAL_TABLET | Freq: Once | ORAL | Status: DC

## 2017-05-29 MED ORDER — CLINDAMYCIN PALMITATE HCL 75 MG/5ML PO SOLR
300.0000 mg | Freq: Once | ORAL | Status: AC
  Administered 2017-05-29: 300 mg via ORAL
  Filled 2017-05-29: qty 20

## 2017-05-29 MED ORDER — CLINDAMYCIN PALMITATE HCL 75 MG/5ML PO SOLR: 300.0000 mg | Freq: Three times a day (TID) | ORAL | 0 refills | Status: DC

## 2017-05-29 NOTE — ED Provider Notes (Signed)
[facility]-EMERGENCY DEPT Provider Note   CSN: [id number] Arrival date & time: 05/28/17  2328     History   Chief Complaint Chief Complaint  Patient presents with  . Oral Swelling    HPI Traci King is a 27 y.o. female.  The history is provided by the patient and a relative.  Dental Pain   This is a new problem. The current episode started more than 2 days ago. The problem occurs daily. The problem has been rapidly worsening. The pain is severe. Treatments tried: Antibiotics. The treatment provided no relief.   Patient presents with continued dental pain.  She was seen on December 27 for dental pain in her left lower jaw, she has been taking her amoxicillin but over the past 12-24 hours she had worsening pain and swelling of the jaw Reports it hurts to swallow, but no drooling reported.  No fevers or vomiting is reported Past Medical History:  Diagnosis Date  . Abnormal Pap smear   . Herpes simplex type 1 infection   . History of stomach ulcers     There are no active problems to display for this patient.   Past Surgical History:  Procedure Laterality Date  . NO PAST SURGERIES      OB History    Gravida Para Term Preterm AB Living   1 1 0 1 0 1   SAB TAB Ectopic Multiple Live Births   0 0 0 0 1       Home Medications    Prior to Admission medications   Medication Sig Start Date End Date Taking? Authorizing Provider  acetaminophen (TYLENOL) 500 MG tablet Take 1,000 mg by mouth every 6 (six) hours as needed for moderate pain.   Yes [provider]  amoxicillin (AMOXIL) 500 MG capsule Take 1 capsule (500 mg total) by mouth 3 (three) times daily. 05/22/17  Yes Rise MuLeaphart, Kenneth T, PA-C  etonogestrel (IMPLANON) 68 MG IMPL implant Inject 1 each into the skin once.   Yes [provider]  lidocaine (XYLOCAINE) 2 % solution Use as directed 20 mLs in the mouth or throat as needed for mouth pain. 05/22/17  Yes Leaphart, Lynann BeaverKenneth  T, PA-C  traMADol (ULTRAM) 50 MG tablet Take 1 tablet (50 mg total) by mouth every 6 (six) hours as needed. Patient taking differently: Take 50 mg by mouth every 6 (six) hours as needed for moderate pain.  05/22/17  Yes Leaphart, Lynann BeaverKenneth T, PA-C  albuterol (PROVENTIL HFA;VENTOLIN HFA) 108 (90 BASE) MCG/ACT inhaler Inhale 1-2 puffs into the lungs every 6 (six) hours as needed for wheezing. Patient not taking: Reported on 05/29/2017 03/01/15   Garlon HatchetSanders, Lisa M, PA-C  benzonatate (TESSALON) 100 MG capsule Take 1 capsule (100 mg total) by mouth every 8 (eight) hours. Patient not taking: Reported on 05/29/2017 03/01/15   Garlon HatchetSanders, Lisa M, PA-C  diazepam (VALIUM) 5 MG tablet Take 1 tablet (5 mg total) by mouth every 8 (eight) hours as needed for anxiety. Patient not taking: Reported on 05/29/2017 04/28/13   Jaynie CrumbleKirichenko, Tatyana, PA-C  HYDROcodone-acetaminophen (NORCO) 5-325 MG per tablet Take 1 tablet by mouth every 6 (six) hours as needed. Patient not taking: Reported on 05/29/2017 04/28/13   Jaynie CrumbleKirichenko, Tatyana, PA-C  ibuprofen (ADVIL,MOTRIN) 800 MG tablet Take 1 tablet (800 mg total) by mouth 3 (three) times daily. Patient not taking: Reported on 05/29/2017 03/01/15   Garlon HatchetSanders, Lisa M, PA-C    Family History Family History  Problem Relation Age of  Onset  . Hypertension Mother   . Diabetes Mother   . Hypertension Maternal Grandmother   . Diabetes Maternal Grandfather     Social History Social History   Tobacco Use  . Smoking status: Former Smoker    Packs/day: 0.25    Last attempt to quit: 04/09/2011    Years since quitting: 6.1  . Smokeless tobacco: Never Used  Substance Use Topics  . Alcohol use: No  . Drug use: No     Allergies   Patient has no known allergies.   Review of Systems Review of Systems  Constitutional: Negative for fever.  HENT: Positive for dental problem.   Gastrointestinal: Negative for vomiting.  All other systems reviewed and are negative.    Physical Exam Updated  Vital Signs BP 132/83   Pulse 71   Temp 98.5 F (36.9 C) (Oral)   Resp 18   Ht 1.6 m (5\' 3" )   Wt 49.9 kg (110 lb)   SpO2 100%   BMI 19.49 kg/m   Physical Exam CONSTITUTIONAL: Well developed/well nourished HEAD: Normocephalic/atraumatic EYES: EOMI/PERRL ENMT: Mucous membranes moist, mild trismus noted, no intraoral gingival abscess noted, diffuse tenderness to left lower molars, tenderness and swelling to left mandible and just under the submandibular space.  No erythema, no abscess, no crepitus.  Normal Phonation is noted, no drooling.  Tongue is soft and mobile NECK: supple no meningeal signs CV: S1/S2 noted, no murmurs/rubs/gallops noted LUNGS: Lungs are clear to auscultation bilaterally, no apparent distress ABDOMEN: soft GU:no cva tenderness NEURO: Pt is awake/alert/appropriate, moves all extremitiesx4.  No facial droop.   EXTREMITIES:  full ROM SKIN: warm, color normal PSYCH: Anxious  ED Treatments / Results  Labs (all labs ordered are listed, but only abnormal results are displayed) Labs Reviewed - No data to display  EKG  EKG Interpretation None       Radiology No results found.  Procedures Procedures   Medications Ordered in ED Medications  ondansetron (ZOFRAN-ODT) disintegrating tablet 8 mg (8 mg Oral Not Given 05/29/17 0525)  clindamycin (CLEOCIN) 75 MG/5ML solution 300 mg (300 mg Oral Given 05/29/17 0425)  HYDROmorphone (DILAUDID) injection 0.5 mg (0.5 mg Intramuscular Given 05/29/17 0426)     Initial Impression / Assessment and Plan / ED Course  I have reviewed the triage vital signs and the nursing notes. Narcotic database reviewed and considered in decision making    Recent worsening dental abscess.  She is awake and alert no distress, normal phonation, no stridor She is able to take p.o. Medications There is no abscess that I can drain in the gingiva Clinically I think this  represents worsening dental infection, I do not feel CT imaging is  required We will have her stop her tramadol, start hydrocodone elixir, will also start clindamycin elixir Plan to see oral surgery ASAP  Final Clinical Impressions(s) / ED Diagnoses   Final diagnoses:  Dental abscess    ED Discharge Orders        Ordered    HYDROcodone-acetaminophen (HYCET) 7.5-325 mg/15 ml solution     05/29/17 0511    clindamycin (CLEOCIN) 75 MG/5ML solution  3 times daily     05/29/17 0511    ondansetron (ZOFRAN ODT) 8 MG disintegrating tablet     05/29/17 0523       Zadie Rhine, MD 05/29/17 [phone]

## 2017-05-29 NOTE — ED Triage Notes (Signed)
[facility] pain. Patient reports she was prescribed Amoxicillin and Tramadol. Patient reports she has increased oral swelling. Patient is able to talk in full sentences and able to swallow her salvia. Patient has not followed up with a dentist as recommended.

## 2017-09-21 ENCOUNTER — Emergency Department (HOSPITAL_COMMUNITY)
Admission: EM | Admit: 2017-09-21 | Discharge: 2017-09-21 | Disposition: A | Discharge status: Home / Self Care | Payer: 59 | Attending: Emergency Medicine | Admitting: Emergency Medicine

## 2017-09-21 ENCOUNTER — Encounter (HOSPITAL_COMMUNITY): Payer: Self-pay | Admitting: Emergency Medicine

## 2017-09-21 DIAGNOSIS — Z79899 Other long term (current) drug therapy: Secondary | ICD-10-CM | POA: Diagnosis not present

## 2017-09-21 DIAGNOSIS — L02211 Cutaneous abscess of abdominal wall: Secondary | ICD-10-CM | POA: Insufficient documentation

## 2017-09-21 DIAGNOSIS — Z87891 Personal history of nicotine dependence: Secondary | ICD-10-CM | POA: Diagnosis not present

## 2017-09-21 DIAGNOSIS — R222 Localized swelling, mass and lump, trunk: Secondary | ICD-10-CM | POA: Diagnosis present

## 2017-09-21 DIAGNOSIS — L0291 Cutaneous abscess, unspecified: Secondary | ICD-10-CM

## 2017-09-21 MED ORDER — LIDOCAINE-EPINEPHRINE (PF) 2 %-1:200000 IJ SOLN
10.0000 mL | Freq: Once | INTRAMUSCULAR | Status: AC
  Administered 2017-09-21: 10 mL
  Filled 2017-09-21: qty 20

## 2017-09-21 MED ORDER — BACITRACIN ZINC 500 UNIT/GM EX OINT: TOPICAL_OINTMENT | Freq: Once | CUTANEOUS | Status: DC

## 2017-09-21 NOTE — ED Provider Notes (Signed)
[facility]-EMERGENCY DEPT Provider Note   CSN: [id number] Arrival date & time: 09/21/17  1039     History   Chief Complaint Chief Complaint  Patient presents with  . Abscess    abd    HPI Traci King is a 27 y.o. female.  The history is provided by the patient and medical records. No language interpreter was used.     Traci King is a 27 y.o. female  with a PMH of prior abscesses who presents to the Emergency Department complaining of abscess to the upper abdomen/lower right chest wall.  She noticed this about 3 weeks ago.  It is not painful.  She started applying warm compresses and a small amount of drainage did start to occur.  She was hopeful that it would resolve on its own as she has had several in the past resolve with warm compresses after draining.  Unfortunately, area became larger.  Denies any redness surrounding.  No fever or chills.   Past Medical History:  Diagnosis Date  . Abnormal Pap smear   . Herpes simplex type 1 infection   . History of stomach ulcers     There are no active problems to display for this patient.   Past Surgical History:  Procedure Laterality Date  . NO PAST SURGERIES       OB History    Gravida  1   Para  1   Term  0   Preterm  1   AB  0   Living  1     SAB  0   TAB  0   Ectopic  0   Multiple  0   Live Births  1            Home Medications    Prior to Admission medications   Medication Sig Start Date End Date Taking? Authorizing Provider  acetaminophen (TYLENOL) 500 MG tablet Take 1,000 mg by mouth every 6 (six) hours as needed for moderate pain.   Yes [provider]  medroxyPROGESTERone (DEPO-PROVERA) 150 MG/ML injection Inject 150 mg into the muscle every 3 (three) months.   Yes [provider]  albuterol (PROVENTIL HFA;VENTOLIN HFA) 108 (90 BASE) MCG/ACT inhaler Inhale 1-2 puffs into the lungs every 6 (six) hours as needed for wheezing. Patient not  taking: Reported on 05/29/2017 03/01/15   Garlon Hatchet, PA-C  clindamycin (CLEOCIN) 75 MG/5ML solution Take 20 mLs (300 mg total) by mouth 3 (three) times daily. Patient not taking: Reported on 09/21/2017 05/29/17   Zadie Rhine, MD  HYDROcodone-acetaminophen (HYCET) 7.5-325 mg/15 ml solution Take 15ml PO Q 6 hours as needed for dental pain Patient not taking: Reported on 09/21/2017 05/29/17   Zadie Rhine, MD  lidocaine (XYLOCAINE) 2 % solution Use as directed 20 mLs in the mouth or throat as needed for mouth pain. Patient not taking: Reported on 09/21/2017 05/22/17   Demetrios Loll T, PA-C  ondansetron (ZOFRAN ODT) 8 MG disintegrating tablet  ODT q4 hours prn nausea Patient not taking: Reported on 09/21/2017 05/29/17   Zadie Rhine, MD    Family History Family History  Problem Relation Age of Onset  . Hypertension Mother   . Diabetes Mother   . Hypertension Maternal Grandmother   . Diabetes Maternal Grandfather     Social History Social History   Tobacco Use  . Smoking status: Former Smoker    Packs/day: 0.25    Last attempt to quit: 04/09/2011  Years since quitting: 6.4  . Smokeless tobacco: Never Used  Substance Use Topics  . Alcohol use: No  . Drug use: No     Allergies   Patient has no known allergies.   Review of Systems Review of Systems  Skin: Positive for wound.  All other systems reviewed and are negative.    Physical Exam Updated Vital Signs BP 112/85 (BP Location: Left Arm)   Pulse 91   Temp 98.7 F (37.1 C) (Oral)   Resp 16   Ht  (1.626 m)   Wt 54.4 kg (120 lb)   SpO2 100%   BMI 20.60 kg/m   Physical Exam  Constitutional: She is oriented to person, place, and time. She appears well-developed and well-nourished. No distress.  HENT:  Head: Normocephalic and atraumatic.  Cardiovascular: Normal rate, regular rhythm and normal heart sounds.  No murmur heard. Pulmonary/Chest: Effort normal and breath sounds normal. No respiratory  distress.  Abdominal: Soft. She exhibits no distension. There is no tenderness.  Musculoskeletal: She exhibits no edema.  Neurological: She is alert and oriented to person, place, and time.  Skin: Skin is warm and dry.  1.5 cm area of fluctuance to the right upper abdomen consistent with abscess.  No surrounding erythema.  Not overtly tender to the touch.  Nursing note and vitals reviewed.    ED Treatments / Results  Labs (all labs ordered are listed, but only abnormal results are displayed) Labs Reviewed - No data to display  EKG None  Radiology No results found.  Procedures .Marland KitchenIncision and Drainage Date/Time: 09/21/2017 5:47 PM Performed by: Tarah Buboltz, Chase Picket, PA-C Authorized by: Gregorio Worley, Chase Picket, PA-C   Consent:    Consent obtained:  Verbal   Consent given by:  Patient   Risks discussed:  Bleeding, incomplete drainage, pain and infection Location:    Type:  Abscess   Size:  1.5 cm   Location:  Trunk   Trunk location:  Abdomen Pre-procedure details:    Skin preparation:  Betadine Anesthesia (see MAR for exact dosages):    Anesthesia method:  Local infiltration   Local anesthetic:  Lidocaine 2% WITH epi Procedure type:    Complexity:  Simple Procedure details:    Incision types:  Single straight   Scalpel blade:  11   Wound management:  Probed and deloculated   Drainage:  Purulent   Drainage amount:  Moderate   Wound treatment:  Wound left open Post-procedure details:    Patient tolerance of procedure:  Tolerated well, no immediate complications   (including critical care time)  Medications Ordered in ED Medications  bacitracin ointment (has no administration in time range)  lidocaine-EPINEPHrine (XYLOCAINE W/EPI) 2 %-1:200000 (PF) injection 10 mL (10 mLs Infiltration Given 09/21/17 1717)     Initial Impression / Assessment and Plan / ED Course  I have reviewed the triage vital signs and the nursing notes.  Pertinent labs & imaging results that were  available during my care of the patient were reviewed by me and considered in my medical decision making (see chart for details).    Traci King is a 27 y.o. female who presents to ED for abscess requiring incision and drainage. I&D performed per procedure note above. Patient tolerated the procedure well.   No evidence of surrounding erythema to suggest cellulitis. Wound care instructions discussed. Wound check in 2-3 days. Return to ER if concern for spread of infection, increasing pain, fevers or other concerns. All questions answered.  Final  Clinical Impressions(s) / ED Diagnoses   Final diagnoses:  Abscess    ED Discharge Orders    None       Dequandre Cordova, Chase Picket, PA-C 09/21/17 1750    Raeford Razor, MD 09/21/17 1905

## 2017-09-21 NOTE — Discharge Instructions (Signed)
It was my pleasure taking care of you today!  Continue warm compresses to the area.  Keep wound clean and dry washing multiple times per day.  Follow-up with your primary care doctor for further discussion of recurrent abscesses.  Return to ER for any worsening symptoms, any additional concerns.

## 2017-09-21 NOTE — ED Triage Notes (Signed)
Pt c/o abd pains due to abscess like on her right side for 3 weeks and having drainage out of it. Denies any n/v/d

## 2019-04-13 ENCOUNTER — Emergency Department (HOSPITAL_COMMUNITY): Payer: No Typology Code available for payment source

## 2019-04-13 ENCOUNTER — Emergency Department (HOSPITAL_COMMUNITY)
Admission: EM | Admit: 2019-04-13 | Discharge: 2019-04-13 | Disposition: A | Discharge status: Home / Self Care | Payer: No Typology Code available for payment source | Attending: Emergency Medicine | Admitting: Emergency Medicine

## 2019-04-13 ENCOUNTER — Other Ambulatory Visit: Payer: Self-pay

## 2019-04-13 ENCOUNTER — Encounter (HOSPITAL_COMMUNITY): Payer: Self-pay | Admitting: Emergency Medicine

## 2019-04-13 DIAGNOSIS — M79662 Pain in left lower leg: Secondary | ICD-10-CM | POA: Diagnosis not present

## 2019-04-13 DIAGNOSIS — M79651 Pain in right thigh: Secondary | ICD-10-CM | POA: Insufficient documentation

## 2019-04-13 DIAGNOSIS — Z87891 Personal history of nicotine dependence: Secondary | ICD-10-CM | POA: Insufficient documentation

## 2019-04-13 DIAGNOSIS — Z79899 Other long term (current) drug therapy: Secondary | ICD-10-CM | POA: Insufficient documentation

## 2019-04-13 DIAGNOSIS — M545 Low back pain: Secondary | ICD-10-CM | POA: Insufficient documentation

## 2019-04-13 LAB — POC URINE PREG, ED: Preg Test, Ur: NEGATIVE

## 2019-04-13 MED ORDER — ACETAMINOPHEN 500 MG PO TABS
1000.0000 mg | ORAL_TABLET | Freq: Once | ORAL | Status: AC
  Administered 2019-04-13: 1000 mg via ORAL
  Filled 2019-04-13: qty 2

## 2019-04-13 MED ORDER — CYCLOBENZAPRINE HCL 10 MG PO TABS: 10.0000 mg | ORAL_TABLET | Freq: Three times a day (TID) | ORAL | 0 refills | Status: DC

## 2019-04-13 MED ORDER — CYCLOBENZAPRINE HCL 10 MG PO TABS
5.0000 mg | ORAL_TABLET | Freq: Once | ORAL | Status: AC
  Administered 2019-04-13: 18:00:00 5 mg via ORAL
  Filled 2019-04-13: qty 1

## 2019-04-13 MED ORDER — IBUPROFEN 800 MG PO TABS
800.0000 mg | ORAL_TABLET | Freq: Once | ORAL | Status: AC
  Administered 2019-04-13: 18:00:00 800 mg via ORAL
  Filled 2019-04-13: qty 1
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
## 2019-06-18 ENCOUNTER — Emergency Department (HOSPITAL_COMMUNITY)
Admission: EM | Admit: 2019-06-18 | Discharge: 2019-06-18 | Disposition: A | Discharge status: Home / Self Care | Payer: Medicaid Other | Attending: Emergency Medicine | Admitting: Emergency Medicine

## 2019-06-18 ENCOUNTER — Encounter (HOSPITAL_COMMUNITY): Payer: Self-pay

## 2019-06-18 ENCOUNTER — Other Ambulatory Visit: Payer: Self-pay

## 2019-06-18 DIAGNOSIS — F17200 Nicotine dependence, unspecified, uncomplicated: Secondary | ICD-10-CM | POA: Insufficient documentation

## 2019-06-18 DIAGNOSIS — L03313 Cellulitis of chest wall: Secondary | ICD-10-CM

## 2019-06-18 DIAGNOSIS — Z79899 Other long term (current) drug therapy: Secondary | ICD-10-CM | POA: Insufficient documentation

## 2019-06-18 MED ORDER — DOXYCYCLINE HYCLATE 100 MG PO CAPS: 100.0000 mg | ORAL_CAPSULE | Freq: Two times a day (BID) | ORAL | 0 refills | Status: DC

## 2019-06-18 NOTE — ED Provider Notes (Signed)
[facility] DEPT Provider Note   CSN: [id number] Arrival date & time: 06/18/19  0350     History Chief Complaint  Patient presents with  . Chest Pain    Traci King is a 29 y.o. female.  29 year old female who presents with central chest lesion times about a month.  Has a history of folliculitis and this is similar.  No fever or chills.  No drainage from the wound.  No other abscesses noted.        Past Medical History:  Diagnosis Date  . Abnormal Pap smear   . Herpes simplex type 1 infection   . History of stomach ulcers     There are no problems to display for this patient.   Past Surgical History:  Procedure Laterality Date  . NO PAST SURGERIES       OB History    Gravida  1   Para  1   Term  0   Preterm  1   AB  0   Living  1     SAB  0   TAB  0   Ectopic  0   Multiple  0   Live Births  1           Family History  Problem Relation Age of Onset  . Hypertension Mother   . Diabetes Mother   . Hypertension Maternal Grandmother   . Diabetes Maternal Grandfather     Social History   Tobacco Use  . Smoking status: Current Every Day Smoker    Packs/day: 0.25    Last attempt to quit: 04/09/2011    Years since quitting: 8.1  . Smokeless tobacco: Never Used  Substance Use Topics  . Alcohol use: No  . Drug use: No    Home Medications Prior to Admission medications   Medication Sig Start Date End Date Taking? Authorizing Provider  acetaminophen (TYLENOL) 500 MG tablet Take 1,000 mg by mouth every 6 (six) hours as needed for moderate pain.    [provider]  albuterol (PROVENTIL HFA;VENTOLIN HFA) 108 (90 BASE) MCG/ACT inhaler Inhale 1-2 puffs into the lungs every 6 (six) hours as needed for wheezing. Patient not taking: Reported on 05/29/2017 03/01/15   Larene Pickett, PA-C  clindamycin (CLEOCIN) 75 MG/5ML solution Take 20 mLs (300 mg total) by mouth 3 (three) times daily. Patient not  taking: Reported on 09/21/2017 05/29/17   Ripley Fraise, MD  cyclobenzaprine (FLEXERIL) 10 MG tablet Take 1 tablet (10 mg total) by mouth 3 (three) times daily. 04/13/19   Kinnie Feil, PA-C  HYDROcodone-acetaminophen (HYCET) 7.5-325 mg/15 ml solution Take 56ml PO Q 6 hours as needed for dental pain Patient not taking: Reported on 09/21/2017 05/29/17   Ripley Fraise, MD  lidocaine (XYLOCAINE) 2 % solution Use as directed 20 mLs in the mouth or throat as needed for mouth pain. Patient not taking: Reported on 09/21/2017 05/22/17   Ocie Cornfield T, PA-C  medroxyPROGESTERone (DEPO-PROVERA) 150 MG/ML injection Inject 150 mg into the muscle every 3 (three) months.    [provider]  ondansetron (ZOFRAN ODT) 8 MG disintegrating tablet 8mg  ODT q4 hours prn nausea Patient not taking: Reported on 09/21/2017 05/29/17   Ripley Fraise, MD    Allergies    Patient has no known allergies.  Review of Systems   Review of Systems  All other systems reviewed and are negative.   Physical Exam Updated Vital Signs BP 113/75   Pulse 75  Temp 98.1 F (36.7 C) (Oral)   Resp 16   SpO2 100%   Physical Exam Vitals and nursing note reviewed.  Constitutional:      General: She is not in acute distress.    Appearance: Normal appearance. She is well-developed. She is not toxic-appearing.  HENT:     Head: Normocephalic and atraumatic.  Eyes:     General: Lids are normal.     Conjunctiva/sclera: Conjunctivae normal.     Pupils: Pupils are equal, round, and reactive to light.  Neck:     Thyroid: No thyroid mass.     Trachea: No tracheal deviation.  Cardiovascular:     Rate and Rhythm: Normal rate and regular rhythm.     Heart sounds: Normal heart sounds. No murmur. No gallop.   Pulmonary:     Effort: Pulmonary effort is normal. No respiratory distress.     Breath sounds: Normal breath sounds. No stridor. No decreased breath sounds, wheezing, rhonchi or rales.  Chest:    Abdominal:       General: Bowel sounds are normal. There is no distension.     Palpations: Abdomen is soft.     Tenderness: There is no abdominal tenderness. There is no rebound.  Musculoskeletal:        General: No tenderness. Normal range of motion.     Cervical back: Normal range of motion and neck supple.  Skin:    General: Skin is warm and dry.     Findings: Erythema present.  Neurological:     Mental Status: She is alert and oriented to person, place, and time.     GCS: GCS eye subscore is 4. GCS verbal subscore is 5. GCS motor subscore is 6.     Cranial Nerves: No cranial nerve deficit.     Sensory: No sensory deficit.  Psychiatric:        Speech: Speech normal.        Behavior: Behavior normal.     ED Results / Procedures / Treatments   Labs (all labs ordered are listed, but only abnormal results are displayed) Labs Reviewed - No data to display  EKG None  Radiology No results found.  Procedures Procedures (including critical care time)  Medications Ordered in ED Medications - No data to display  ED Course  I have reviewed the triage vital signs and the nursing notes.  Pertinent labs & imaging results that were available during my care of the patient were reviewed by me and considered in my medical decision making (see chart for details).    MDM Rules/Calculators/A&P                      Patient with early cellulitis.  No definable abscess.  Placed on doxycycline return precautions given. Final Clinical Impression(s) / ED Diagnoses Final diagnoses:  None    Rx / DC Orders ED Discharge Orders    None       Lorre Nick, MD 06/18/19 [phone]

## 2019-06-18 NOTE — ED Triage Notes (Signed)
Pt c/o central chest pain/cellulitis x 1 month, states pain increases with inspiration. Denies pain anywhere else.

## 2019-08-13 ENCOUNTER — Ambulatory Visit: Payer: Medicaid Other | Attending: Internal Medicine

## 2019-08-13 DIAGNOSIS — Z23 Encounter for immunization: Secondary | ICD-10-CM

## 2019-08-13 NOTE — Progress Notes (Signed)
   Covid-19 Vaccination Clinic  Name:  Traci King    MRN: [id number] DOB: 09-Feb-1991  08/13/2019  [name] was observed post Covid-19 immunization for 15 minutes without incident. She was provided with Vaccine Information Sheet and instruction to access the V-Safe system.   [name] was instructed to call 911 with any severe reactions post vaccine: Marland Kitchen Difficulty breathing  . Swelling of face and throat  . A fast heartbeat  . A bad rash all over body  . Dizziness and weakness   Immunizations Administered    Name Date Dose VIS Date Route   Pfizer COVID-19 Vaccine 08/13/2019  8:10 AM 0.3 mL 05/07/2019 Intramuscular   Manufacturer: [employer], [employer]   Lot: [id number]   NDC: [address]-8

## 2019-09-07 ENCOUNTER — Ambulatory Visit: Payer: Medicaid Other

## 2019-09-08 ENCOUNTER — Ambulatory Visit: Payer: Medicaid Other | Attending: Internal Medicine

## 2019-09-08 DIAGNOSIS — Z23 Encounter for immunization: Secondary | ICD-10-CM

## 2019-09-08 NOTE — Progress Notes (Signed)
   Covid-19 Vaccination Clinic  Name:  Traci King    MRN: [id number] DOB: Jan 06, 1991  09/08/2019  [name] was observed post Covid-19 immunization for 15 minutes without incident. She was provided with Vaccine Information Sheet and instruction to access the V-Safe system.   [name] was instructed to call 911 with any severe reactions post vaccine: Marland Kitchen Difficulty breathing  . Swelling of face and throat  . A fast heartbeat  . A bad rash all over body  . Dizziness and weakness   Immunizations Administered    Name Date Dose VIS Date Route   Pfizer COVID-19 Vaccine 09/08/2019  8:12 AM 0.3 mL 05/07/2019 Intramuscular   Manufacturer: [employer], [employer]   Lot: [id number]   NDC: [address]-0

## 2020-05-04 ENCOUNTER — Ambulatory Visit (HOSPITAL_COMMUNITY)
Admission: EM | Admit: 2020-05-04 | Discharge: 2020-05-04 | Disposition: A | Discharge status: Home / Self Care | Payer: Self-pay | Attending: Family Medicine | Admitting: Family Medicine

## 2020-05-04 ENCOUNTER — Encounter (HOSPITAL_COMMUNITY): Payer: Self-pay

## 2020-05-04 ENCOUNTER — Other Ambulatory Visit: Payer: Self-pay

## 2020-05-04 DIAGNOSIS — K0889 Other specified disorders of teeth and supporting structures: Secondary | ICD-10-CM

## 2020-05-04 DIAGNOSIS — N764 Abscess of vulva: Secondary | ICD-10-CM

## 2020-05-04 MED ORDER — LIDOCAINE HCL 2 % IJ SOLN
INTRAMUSCULAR | Status: AC
  Filled 2020-05-04: qty 20

## 2020-05-04 MED ORDER — CLINDAMYCIN HCL 300 MG PO CAPS: 300.0000 mg | ORAL_CAPSULE | Freq: Three times a day (TID) | ORAL | 0 refills | Status: DC

## 2020-05-04 MED ORDER — HYDROCODONE-ACETAMINOPHEN 5-325 MG PO TABS: 1.0000 | ORAL_TABLET | Freq: Four times a day (QID) | ORAL | 0 refills | Status: DC | PRN

## 2020-05-04 NOTE — ED Triage Notes (Signed)
Pt presents with abscess in left groin area X 2 months; pt also complains of left side dental pain X 2 weeks.

## 2020-05-04 NOTE — ED Provider Notes (Signed)
[facility] - Inpatient CARE [facility]   [id number] 05/04/20 Arrival Time: 1342  ASSESSMENT & PLAN:  1. Pain, dental   2. Left genital labial abscess     Incision and Drainage Procedure Note  Anesthesia: 1% plain lidocaine  Procedure Details  The procedure, risks and complications have been discussed in detail (including, but not limited to pain and bleeding) with the patient.  The skin induration was prepped and draped in the usual fashion. After adequate local anesthesia, I&D with a #11 blade was performed on the left upper labia with copious purulent drainage.  EBL: minimal Drains: none Packing: none Condition: Tolerated procedure well Complications: none.  Meds ordered this encounter  Medications  . clindamycin (CLEOCIN) 300 MG capsule    Sig: Take 1 capsule (300 mg total) by mouth 3 (three) times daily.    Dispense:  21 capsule    Refill:  0  . HYDROcodone-acetaminophen (NORCO/VICODIN) 5-325 MG tablet    Sig: Take 1 tablet by mouth every 6 (six) hours as needed for moderate pain or severe pain.    Dispense:  8 tablet    Refill:  0    Wound care instructions discussed and given in written format. To return in 48 hours for wound check if needed. Finish all antibiotics. Should cover dental infection. Explained.     Discharge Instructions     Be aware, you have been prescribed pain medications that may cause drowsiness. While taking this medication, do not take any other medications containing acetaminophen (Tylenol). Do not combine with alcohol or other illicit drugs. Please do not drive, operate heavy machinery, or take part in activities that require making important decisions while on this medication as your judgement may be clouded.       Reviewed expectations re: course of current medical issues. Questions answered. Outlined signs and symptoms indicating need for more acute intervention. Patient verbalized understanding. After Visit Summary  given.   SUBJECTIVE:  Traci King is a 29 y.o. female who presents with a possible infection of her L labia; on/off several months; waxes area. No drainage or bleeding. Symptoms have gradually worsened since beginning. Fever: absent. OTC/home treatment: none. Also reports L upper rear dental pain; 2 weeks; sore/aching. Tolerating PO intake. "Think my tooth broke"..   OBJECTIVE:  Vitals:   05/04/20 1504  BP: 136/86  Pulse: 76  Resp: 18  Temp: 98.8 F (37.1 C)  TempSrc: Oral  SpO2: 100%     General appearance: alert; no distress HEENT: L upper rear gum TTP/inflammation; molar appears broken; no frank abscess L labia: approx 1 cm oval induration; tender to touch; no active drainage or bleeding Psychological: alert and cooperative; normal mood and affect  No Known Allergies  Past Medical History:  Diagnosis Date  . Abnormal Pap smear   . Herpes simplex type 1 infection   . History of stomach ulcers    Social History   Socioeconomic History  . Marital status: Single    Spouse name: Not on file  . Number of children: Not on file  . Years of education: Not on file  . Highest education level: Not on file  Occupational History  . Not on file  Tobacco Use  . Smoking status: Current Every Day Smoker    Packs/day: 0.25    Last attempt to quit: 04/09/2011    Years since quitting: 9.0  . Smokeless tobacco: Never Used  Vaping Use  . Vaping Use: Never used  Substance and Sexual Activity  . Alcohol  use: No  . Drug use: No  . Sexual activity: Yes  Other Topics Concern  . Not on file  Social History Narrative  . Not on file   Social Determinants of Health   Financial Resource Strain: Not on file  Food Insecurity: Not on file  Transportation Needs: Not on file  Physical Activity: Not on file  Stress: Not on file  Social Connections: Not on file   Family History  Problem Relation Age of Onset  . Hypertension Mother   . Diabetes Mother   . Hypertension  Maternal Grandmother   . Diabetes Maternal Grandfather    Past Surgical History:  Procedure Laterality Date  . NO PAST SURGERIES             Mardella Layman, MD 05/04/20 [phone]

## 2020-05-04 NOTE — Discharge Instructions (Addendum)

## 2020-08-22 ENCOUNTER — Ambulatory Visit (HOSPITAL_COMMUNITY)
Admission: EM | Admit: 2020-08-22 | Discharge: 2020-08-22 | Disposition: A | Discharge status: Home / Self Care | Payer: Medicaid Other | Attending: Student | Admitting: Student

## 2020-08-22 ENCOUNTER — Encounter (HOSPITAL_COMMUNITY): Payer: Self-pay

## 2020-08-22 ENCOUNTER — Telehealth (HOSPITAL_COMMUNITY): Payer: Self-pay

## 2020-08-22 ENCOUNTER — Other Ambulatory Visit: Payer: Self-pay

## 2020-08-22 DIAGNOSIS — K047 Periapical abscess without sinus: Secondary | ICD-10-CM

## 2020-08-22 DIAGNOSIS — K0889 Other specified disorders of teeth and supporting structures: Secondary | ICD-10-CM

## 2020-08-22 DIAGNOSIS — R030 Elevated blood-pressure reading, without diagnosis of hypertension: Secondary | ICD-10-CM

## 2020-08-22 MED ORDER — AMOXICILLIN-POT CLAVULANATE 875-125 MG PO TABS: 1.0000 | ORAL_TABLET | Freq: Two times a day (BID) | ORAL | 0 refills | Status: DC

## 2020-08-22 MED ORDER — LIDOCAINE VISCOUS HCL 2 % MT SOLN: 15.0000 mL | OROMUCOSAL | 0 refills | Status: DC | PRN

## 2020-08-22 NOTE — Discharge Instructions (Signed)
-  Start the antibiotic-Augmentin (amoxicillin-clavulanate), 1 pill every 12 hours for 7 days.  You can take this with food like with breakfast and dinner. -For dental pain, use lidocaine mouthwash up to every 4 hours. Make sure not to eat for at least 1 hour after using this, as your mouth will be very numb and you could bite yourself. -Take Tylenol 1000 mg 3 times daily, and ibuprofen 800 mg 3 times daily with food.  You can take these together, or alternate every 3-4 hours. -If your pain is still uncontrolled on this regimen, f/u with dentist or ER. If you develop new symptoms of dizziness, chest pain, trouble swallowing, shortness of breath- head to ED or call 911. -Please check your blood pressure at home or at the pharmacy. If this continues to be >140/90, follow-up with your primary care provider for further blood pressure management/ medication titration. If you develop chest pain, shortness of breath, vision changes, the worst headache of your life- head straight to the ED or call 911.

## 2020-08-22 NOTE — ED Provider Notes (Signed)
[facility]    CSN: [id number] Arrival date & time: 08/22/20  1630      History   Chief Complaint Chief Complaint  Patient presents with  . Dental Pain    HPI Traci King is a 30 y.o. female presenting with dental pain x12 hours. History of dental pain and abscesses of various locations.  States that she has taken maximum dose of Tylenol and ibuprofen this afternoon without improvement in pain.  Endorses bilateral pain of upper molars on left and right side.  Describes this as 10 out of 10.  Denies foul taste in mouth, shortness of breath, trouble swallowing, chest pain, dizziness.  HPI  Past Medical History:  Diagnosis Date  . Abnormal Pap smear   . Herpes simplex type 1 infection   . History of stomach ulcers     There are no problems to display for this patient.   Past Surgical History:  Procedure Laterality Date  . NO PAST SURGERIES      OB History    Gravida  1   Para  1   Term  0   Preterm  1   AB  0   Living  1     SAB  0   IAB  0   Ectopic  0   Multiple  0   Live Births  1            Home Medications    Prior to Admission medications   Medication Sig Start Date End Date Taking? Authorizing Provider  amoxicillin-clavulanate (AUGMENTIN) 875-125 MG tablet Take 1 tablet by mouth every 12 (twelve) hours. 08/22/20  Yes Rhys Martini, PA-C  lidocaine (XYLOCAINE) 2 % solution Use as directed 15 mLs in the mouth or throat as needed for mouth pain. 08/22/20  Yes Rhys Martini, PA-C  cyclobenzaprine (FLEXERIL) 10 MG tablet Take 1 tablet (10 mg total) by mouth 3 (three) times daily. 04/13/19   Liberty Handy, PA-C  medroxyPROGESTERone (DEPO-PROVERA) 150 MG/ML injection Inject 150 mg into the muscle every 3 (three) months.    [provider]  ondansetron (ZOFRAN ODT) 8 MG disintegrating tablet 8mg  ODT q4 hours prn nausea Patient not taking: Reported on 09/21/2017 05/29/17   07/27/17, MD  albuterol (PROVENTIL  HFA;VENTOLIN HFA) 108 (90 BASE) MCG/ACT inhaler Inhale 1-2 puffs into the lungs every 6 (six) hours as needed for wheezing. Patient not taking: Reported on 05/29/2017 03/01/15 08/22/20  08/24/20, PA-C    Family History Family History  Problem Relation Age of Onset  . Hypertension Mother   . Diabetes Mother   . Hypertension Maternal Grandmother   . Diabetes Maternal Grandfather     Social History Social History   Tobacco Use  . Smoking status: Current Every Day Smoker    Packs/day: 0.25    Last attempt to quit: 04/09/2011    Years since quitting: 9.3  . Smokeless tobacco: Never Used  Vaping Use  . Vaping Use: Never used  Substance Use Topics  . Alcohol use: No  . Drug use: No     Allergies   Patient has no known allergies.   Review of Systems Review of Systems  HENT: Positive for dental problem.   All other systems reviewed and are negative.    Physical Exam Triage Vital Signs ED Triage Vitals  Enc Vitals Group     BP 08/22/20 1719 (!) 151/106     Pulse Rate 08/22/20 1719 (!) 101  Resp --      Temp 08/22/20 1719 98.6 F (37 C)     Temp Source 08/22/20 1719 Oral     SpO2 08/22/20 1719 94 %     Weight --      Height --      Head Circumference --      Peak Flow --      Pain Score 08/22/20 1720 10     Pain Loc --      Pain Edu? --      Excl. in GC? --    No data found.  Updated Vital Signs BP (!) 151/106 (BP Location: Right Arm)   Pulse (!) 101   Temp 98.6 F (37 C) (Oral)   SpO2 94%   Visual Acuity Right Eye Distance:   Left Eye Distance:   Bilateral Distance:    Right Eye Near:   Left Eye Near:    Bilateral Near:     Physical Exam Vitals reviewed.  Constitutional:      General: She is not in acute distress.    Appearance: Normal appearance. She is not ill-appearing, toxic-appearing or diaphoretic.  HENT:     Head: Normocephalic and atraumatic.     Jaw: There is normal jaw occlusion. No trismus, tenderness, swelling, pain on  movement or malocclusion.     Salivary Glands: Right salivary gland is not diffusely enlarged or tender. Left salivary gland is not diffusely enlarged or tender.     Right Ear: Hearing, tympanic membrane, ear canal and external ear normal. There is no impacted cerumen.     Left Ear: Hearing, tympanic membrane, ear canal and external ear normal. There is no impacted cerumen.     Nose: Nose normal.     Mouth/Throat:     Lips: Pink.     Mouth: Mucous membranes are moist. No lacerations or oral lesions.     Dentition: Abnormal dentition. Does not have dentures. Dental tenderness and dental caries present.     Tongue: No lesions. Tongue does not deviate from midline.     Palate: No mass.     Pharynx: Oropharynx is clear. Uvula midline. No oropharyngeal exudate or posterior oropharyngeal erythema.     Tonsils: No tonsillar exudate or tonsillar abscesses.     Comments: Poor dentician  Mildly TTP R upper molars. Scant surrounding erythema. No swelling, discharge. No other tenderness.  Eyes:     Extraocular Movements: Extraocular movements intact.     Pupils: Pupils are equal, round, and reactive to light.  Pulmonary:     Effort: Pulmonary effort is normal.  Skin:    Capillary Refill: Capillary refill takes less than 2 seconds.  Neurological:     General: No focal deficit present.     Mental Status: She is alert and oriented to person, place, and time.  Psychiatric:        Mood and Affect: Mood normal.        Behavior: Behavior normal.        Thought Content: Thought content normal.        Judgment: Judgment normal.      UC Treatments / Results  Labs (all labs ordered are listed, but only abnormal results are displayed) Labs Reviewed - No data to display  EKG   Radiology No results found.  Procedures Procedures (including critical care time)  Medications Ordered in UC Medications - No data to display  Initial Impression / Assessment and Plan / UC Course  I have reviewed  the  triage vital signs and the nursing notes.  Pertinent labs & imaging results that were available during my care of the patient were reviewed by me and considered in my medical decision making (see chart for details).     This patient is a 31 year old female presenting with dental pain and infection. Today this pt is afebrile nontachycardic nontachypneic, oxygenating well on room air, no wheezes rhonchi or rales.   She is hypertensive today, no diagnosis of hypertension.  Rec monitoring this at home.  No red flag symptoms.  Plan to treat with Augmentin and viscous lidocaine as below  Return precautions discussed.  This chart was dictated using voice recognition software, Dragon. Despite the best efforts of this provider to proofread and correct errors, errors may still occur which can change documentation meaning.  Final Clinical Impressions(s) / UC Diagnoses   Final diagnoses:  Dental infection  Pain, dental  Elevated blood pressure reading without diagnosis of hypertension     Discharge Instructions     -Start the antibiotic-Augmentin (amoxicillin-clavulanate), 1 pill every 12 hours for 7 days.  You can take this with food like with breakfast and dinner. -For dental pain, use lidocaine mouthwash up to every 4 hours. Make sure not to eat for at least 1 hour after using this, as your mouth will be very numb and you could bite yourself. -Take Tylenol 1000 mg 3 times daily, and ibuprofen 800 mg 3 times daily with food.  You can take these together, or alternate every 3-4 hours. -If your pain is still uncontrolled on this regimen, f/u with dentist or ER. If you develop new symptoms of dizziness, chest pain, trouble swallowing, shortness of breath- head to ED or call 911. -Please check your blood pressure at home or at the pharmacy. If this continues to be >140/90, follow-up with your primary care provider for further blood pressure management/ medication titration. If you develop chest  pain, shortness of breath, vision changes, the worst headache of your life- head straight to the ED or call 911.      ED Prescriptions    Medication Sig Dispense Auth. Provider   amoxicillin-clavulanate (AUGMENTIN) 875-125 MG tablet Take 1 tablet by mouth every 12 (twelve) hours. 14 tablet Ignacia Bayley E, PA-C   lidocaine (XYLOCAINE) 2 % solution Use as directed 15 mLs in the mouth or throat as needed for mouth pain. 100 mL Rhys Martini, PA-C     PDMP not reviewed this encounter.   Rhys Martini, PA-C 08/22/20 1752

## 2020-08-22 NOTE — ED Triage Notes (Signed)
Pt presents with bilateral side dental pain since waking up this morning.

## 2020-09-23 ENCOUNTER — Ambulatory Visit (HOSPITAL_COMMUNITY)
Admission: EM | Admit: 2020-09-23 | Discharge: 2020-09-23 | Disposition: A | Discharge status: Home / Self Care | Payer: Medicaid Other | Attending: Urgent Care | Admitting: Urgent Care

## 2020-09-23 ENCOUNTER — Encounter (HOSPITAL_COMMUNITY): Payer: Self-pay

## 2020-09-23 ENCOUNTER — Other Ambulatory Visit: Payer: Self-pay

## 2020-09-23 DIAGNOSIS — J029 Acute pharyngitis, unspecified: Secondary | ICD-10-CM

## 2020-09-23 DIAGNOSIS — K08409 Partial loss of teeth, unspecified cause, unspecified class: Secondary | ICD-10-CM

## 2020-09-23 LAB — POCT RAPID STREP A, ED / UC: Streptococcus, Group A Screen (Direct): NEGATIVE

## 2020-09-23 MED ORDER — NAPROXEN 500 MG PO TABS: 500.0000 mg | ORAL_TABLET | Freq: Two times a day (BID) | ORAL | 0 refills | Status: DC

## 2020-09-23 MED ORDER — FLUTICASONE PROPIONATE 50 MCG/ACT NA SUSP: 2.0000 | Freq: Every day | NASAL | 12 refills | Status: DC

## 2020-09-23 MED ORDER — CETIRIZINE HCL 10 MG PO TABS: 10.0000 mg | ORAL_TABLET | Freq: Every day | ORAL | 0 refills | Status: DC

## 2020-09-23 MED ORDER — PSEUDOEPHEDRINE HCL 30 MG PO TABS: 30.0000 mg | ORAL_TABLET | Freq: Three times a day (TID) | ORAL | 0 refills | Status: DC | PRN

## 2020-09-23 NOTE — ED Provider Notes (Signed)
[facility]   MRN: [id number] DOB: 1990-06-05  Subjective:   Traci King is a 30 y.o. female presenting for 4-day history of persistent and worsening left-sided throat pain, painful swallowing.  Radiates into her left side of the upper neck/jaw.  Patient has used Tylenol and Motrin with minimal relief.  Of note, on Monday she did have her upper wisdom tooth removed.  Does not believe that this is related.  She is a smoker.  Is wondering if she has a cold as well but wants to be checked for strep.  Denies fever, runny or stuffy nose, chest pain, shortness of breath.  No current facility-administered medications for this encounter.  Current Outpatient Medications:  .  amoxicillin-clavulanate (AUGMENTIN) 875-125 MG tablet, Take 1 tablet by mouth every 12 (twelve) hours., Disp: 14 tablet, Rfl: 0 .  cyclobenzaprine (FLEXERIL) 10 MG tablet, Take 1 tablet (10 mg total) by mouth 3 (three) times daily., Disp: 20 tablet, Rfl: 0 .  lidocaine (XYLOCAINE) 2 % solution, Use as directed 15 mLs in the mouth or throat as needed for mouth pain., Disp: 100 mL, Rfl: 0 .  medroxyPROGESTERone (DEPO-PROVERA) 150 MG/ML injection, Inject 150 mg into the muscle every 3 (three) months., Disp: , Rfl:  .  ondansetron (ZOFRAN ODT) 8 MG disintegrating tablet, 8mg  ODT q4 hours prn nausea (Patient not taking: Reported on 09/21/2017), Disp: 4 tablet, Rfl: 0   No Known Allergies  Past Medical History:  Diagnosis Date  . Abnormal Pap smear   . Herpes simplex type 1 infection   . History of stomach ulcers      Past Surgical History:  Procedure Laterality Date  . NO PAST SURGERIES      Family History  Problem Relation Age of Onset  . Hypertension Mother   . Diabetes Mother   . Hypertension Maternal Grandmother   . Diabetes Maternal Grandfather     Social History   Tobacco Use  . Smoking status: Current Every Day Smoker    Packs/day: 0.25    Last attempt to quit: 04/09/2011    Years  since quitting: 9.4  . Smokeless tobacco: Never Used  Vaping Use  . Vaping Use: Never used  Substance Use Topics  . Alcohol use: No  . Drug use: No    ROS   Objective:   Vitals: BP 122/80 (BP Location: Right Arm)   Pulse 84   Temp 98.5 F (36.9 C) (Oral)   Resp 16   LMP 09/05/2020   SpO2 99%   Physical Exam Constitutional:      General: She is not in acute distress.    Appearance: She is well-developed. She is not ill-appearing, toxic-appearing or diaphoretic.  HENT:     Head: Normocephalic and atraumatic.     Right Ear: Tympanic membrane, ear canal and external ear normal. No drainage or tenderness. No middle ear effusion. Tympanic membrane is not erythematous.     Left Ear: Tympanic membrane, ear canal and external ear normal. No drainage or tenderness.  No middle ear effusion. Tympanic membrane is not erythematous.     Nose: No congestion or rhinorrhea.     Mouth/Throat:     Mouth: Mucous membranes are moist. No oral lesions.     Pharynx: No pharyngeal swelling, oropharyngeal exudate, posterior oropharyngeal erythema or uvula swelling.     Tonsils: No tonsillar exudate or tonsillar abscesses. 0 on the right. 0 on the left.  Eyes:     General: No  scleral icterus.       Right eye: No discharge.        Left eye: No discharge.     Extraocular Movements: Extraocular movements intact.     Right eye: Normal extraocular motion.     Left eye: Normal extraocular motion.     Conjunctiva/sclera: Conjunctivae normal.     Pupils: Pupils are equal, round, and reactive to light.  Cardiovascular:     Rate and Rhythm: Normal rate.  Pulmonary:     Effort: Pulmonary effort is normal.  Musculoskeletal:     Cervical back: Normal range of motion and neck supple.  Lymphadenopathy:     Cervical: No cervical adenopathy.  Skin:    General: Skin is warm and dry.  Neurological:     General: No focal deficit present.     Mental Status: She is alert and oriented to person, place, and  time.  Psychiatric:        Mood and Affect: Mood normal.        Behavior: Behavior normal.        Thought Content: Thought content normal.        Judgment: Judgment normal.     Results for orders placed or performed during the hospital encounter of 09/23/20 (from the past 24 hour(s))  POCT Rapid Strep A (ED/UC)     Status: None   Collection Time: 09/23/20 12:48 PM  Result Value Ref Range   Streptococcus, Group A Screen (Direct) NEGATIVE NEGATIVE    Assessment and Plan :   PDMP not reviewed this encounter.  1. Viral pharyngitis   2. History of third molar tooth extraction, unspecified edentulism class     Patient reports that she has been spitting up a lot of mucus from her throat.  Counseled that she may have a viral pharyngitis, strep culture pending.  Recommended supportive care.  Naproxen for pain and inflammation. Counseled patient on potential for adverse effects with medications prescribed/recommended today, ER and return-to-clinic precautions discussed, patient verbalized understanding.    Traci Bamberg, PA-C 09/23/20 1309

## 2020-09-23 NOTE — ED Triage Notes (Signed)
Pt present pain on left side throat pain. Symptom started on Wednesday. Pt state having difficulty swallowing.

## 2020-09-24 ENCOUNTER — Ambulatory Visit (HOSPITAL_COMMUNITY)
Admission: EM | Admit: 2020-09-24 | Discharge: 2020-09-24 | Disposition: A | Discharge status: Home / Self Care | Payer: Medicaid Other | Attending: Urgent Care | Admitting: Urgent Care

## 2020-09-24 ENCOUNTER — Encounter (HOSPITAL_COMMUNITY): Payer: Self-pay

## 2020-09-24 ENCOUNTER — Other Ambulatory Visit: Payer: Self-pay

## 2020-09-24 DIAGNOSIS — J029 Acute pharyngitis, unspecified: Secondary | ICD-10-CM

## 2020-09-24 MED ORDER — KETOROLAC TROMETHAMINE 60 MG/2ML IM SOLN
INTRAMUSCULAR | Status: AC
  Filled 2020-09-24: qty 2

## 2020-09-24 MED ORDER — KETOROLAC TROMETHAMINE 60 MG/2ML IM SOLN
60.0000 mg | Freq: Once | INTRAMUSCULAR | Status: AC
Start: 2020-09-24 — End: 2020-09-24
  Administered 2020-09-24: 60 mg via INTRAMUSCULAR

## 2020-09-24 MED ORDER — AMOXICILLIN-POT CLAVULANATE 875-125 MG PO TABS: 1.0000 | ORAL_TABLET | Freq: Two times a day (BID) | ORAL | 0 refills | Status: DC

## 2020-09-24 MED ORDER — HYDROCODONE-ACETAMINOPHEN 5-325 MG PO TABS: 1.0000 | ORAL_TABLET | Freq: Four times a day (QID) | ORAL | 0 refills | Status: DC | PRN

## 2020-09-24 NOTE — ED Notes (Addendum)
[facility] PA notified that pt has potential airway issue, reports some difficulty breathing, and noticeable change in voice. States will go to see pt.

## 2020-09-24 NOTE — Discharge Instructions (Addendum)
If your symptoms continue to worsen then report to the emergency room as you will need imaging like a CT scan to rule out a throat abscess. Otherwise, taken amoxicillin-clavulanate twice daily with food for the infection. Please schedule naproxen twice daily with food for your severe pain.  If you still have pain despite taking naproxen regularly, this is breakthrough pain.  You can use hydrocodone, a narcotic pain medicine, once every 4-6 hours for this.  Once your pain is better controlled, switch back to just naproxen.

## 2020-09-24 NOTE — ED Triage Notes (Signed)
Pt presents with swelling in left side throat which is changing voice and reports difficulty swallowing and some difficulty breathing. States swelling started Wednesday following wisdom teeth out Monday.   Pt states was seenhere  yesterday for same and states meds prescribed yesterday have not helped.

## 2020-09-24 NOTE — ED Provider Notes (Signed)
[facility]   MRN: [id number] DOB: [birth year]  Subjective:   Traci King is a 30 y.o. female presenting for recheck on her throat pain.  Patient was seen yesterday by me, had similar symptoms at but today reports that she is worse.  Strep test was negative yesterday, throat culture is pending.  Advised supportive care which the patient tried and reports today that she did not get relief.  She still has difficulty swallowing but also feels like she is having more difficulty controlling her secretions.  She is able to swallow but feels severe pain with this.  No current facility-administered medications for this encounter.  Current Outpatient Medications:  .  amoxicillin-clavulanate (AUGMENTIN) 875-125 MG tablet, Take 1 tablet by mouth every 12 (twelve) hours., Disp: 14 tablet, Rfl: 0 .  cetirizine (ZYRTEC ALLERGY) 10 MG tablet, Take 1 tablet (10 mg total) by mouth daily., Disp: 30 tablet, Rfl: 0 .  cyclobenzaprine (FLEXERIL) 10 MG tablet, Take 1 tablet (10 mg total) by mouth 3 (three) times daily., Disp: 20 tablet, Rfl: 0 .  fluticasone (FLONASE) 50 MCG/ACT nasal spray, Place 2 sprays into both nostrils daily., Disp: 16 g, Rfl: 12 .  lidocaine (XYLOCAINE) 2 % solution, Use as directed 15 mLs in the mouth or throat as needed for mouth pain., Disp: 100 mL, Rfl: 0 .  medroxyPROGESTERone (DEPO-PROVERA) 150 MG/ML injection, Inject 150 mg into the muscle every 3 (three) months., Disp: , Rfl:  .  naproxen (NAPROSYN) 500 MG tablet, Take 1 tablet (500 mg total) by mouth 2 (two) times daily with a meal., Disp: 30 tablet, Rfl: 0 .  ondansetron (ZOFRAN ODT) 8 MG disintegrating tablet, 8mg  ODT q4 hours prn nausea (Patient not taking: Reported on 09/21/2017), Disp: 4 tablet, Rfl: 0 .  pseudoephedrine (SUDAFED) 30 MG tablet, Take 1 tablet (30 mg total) by mouth every 8 (eight) hours as needed for congestion., Disp: 30 tablet, Rfl: 0   No Known Allergies  Past Medical History:   Diagnosis Date  . Abnormal Pap smear   . Herpes simplex type 1 infection   . History of stomach ulcers      Past Surgical History:  Procedure Laterality Date  . NO PAST SURGERIES      Family History  Problem Relation Age of Onset  . Hypertension Mother   . Diabetes Mother   . Hypertension Maternal Grandmother   . Diabetes Maternal Grandfather     Social History   Tobacco Use  . Smoking status: Former Smoker    Packs/day: 0.25    Quit date: 04/09/2011    Years since quitting: 9.4  . Smokeless tobacco: Never Used  Vaping Use  . Vaping Use: Never used  Substance Use Topics  . Alcohol use: No  . Drug use: Yes    Types: Marijuana    Comment: daily    ROS   Objective:   Vitals: BP 135/89   Pulse 95   Temp 98.9 F (37.2 C)   Resp 18   LMP 09/05/2020   SpO2 100%   Physical Exam Constitutional:      General: She is not in acute distress.    Appearance: Normal appearance. She is well-developed. She is not ill-appearing, toxic-appearing or diaphoretic.  HENT:     Head: Normocephalic and atraumatic.     Nose: Nose normal.     Mouth/Throat:     Mouth: Mucous membranes are moist.     Pharynx: Pharyngeal swelling and posterior  oropharyngeal erythema present. No oropharyngeal exudate or uvula swelling.     Tonsils: No tonsillar exudate. 1+ on the left.      Comments: Difficulty controlling secretions. Has to spit frequently. Left sided submandibular lymphadenopathy. Airway is patent.  Eyes:     General: No scleral icterus.       Right eye: No discharge.        Left eye: No discharge.     Extraocular Movements: Extraocular movements intact.     Conjunctiva/sclera: Conjunctivae normal.     Pupils: Pupils are equal, round, and reactive to light.  Cardiovascular:     Rate and Rhythm: Normal rate.  Pulmonary:     Effort: Pulmonary effort is normal.  Skin:    General: Skin is warm and dry.  Neurological:     General: No focal deficit present.     Mental  Status: She is alert and oriented to person, place, and time.  Psychiatric:        Mood and Affect: Mood normal.        Behavior: Behavior normal.        Thought Content: Thought content normal.        Judgment: Judgment normal.     Results for orders placed or performed during the hospital encounter of 09/23/20 (from the past 24 hour(s))  POCT Rapid Strep A (ED/UC)     Status: None   Collection Time: 09/23/20 12:48 PM  Result Value Ref Range   Streptococcus, Group A Screen (Direct) NEGATIVE NEGATIVE    Assessment and Plan :   PDMP not reviewed this encounter.  1. Acute pharyngitis, unspecified etiology     Will manage for clinical diagnosis of pharyngitis with Augmentin. Use naproxen and hydrocodone for breakthrough pain. At this time, he vital signs are stable, airway is patent and therefore will defer ER visit. I did discuss possibility of an abscess, retropharyngeal abscess but patient wants an antibiotic now instead of ER visit. Counseled patient on potential for adverse effects with medications prescribed/recommended today, strict ER and return-to-clinic precautions discussed, patient verbalized understanding.    Wallis Bamberg, PA-C 09/24/20 1431

## 2020-09-25 LAB — CULTURE, GROUP A STREP (THRC)

## 2021-08-16 ENCOUNTER — Other Ambulatory Visit: Payer: Self-pay

## 2021-08-16 DIAGNOSIS — N6311 Unspecified lump in the right breast, upper outer quadrant: Secondary | ICD-10-CM

## 2021-09-03 ENCOUNTER — Telehealth: Payer: Self-pay

## 2021-09-03 NOTE — Telephone Encounter (Signed)
Telephoned patient at home number. Left a voice message with [facility] call back information. ?
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
## 2021-11-24 ENCOUNTER — Inpatient Hospital Stay (HOSPITAL_COMMUNITY)
Admission: AD | Admit: 2021-11-24 | Discharge: 2021-11-24 | Disposition: A | Discharge status: Home / Self Care | Payer: Medicaid Other | Attending: Obstetrics and Gynecology | Admitting: Obstetrics and Gynecology

## 2021-11-24 ENCOUNTER — Encounter (HOSPITAL_COMMUNITY): Payer: Self-pay

## 2021-11-24 ENCOUNTER — Inpatient Hospital Stay (HOSPITAL_COMMUNITY): Payer: Medicaid Other

## 2021-11-24 DIAGNOSIS — Z3491 Encounter for supervision of normal pregnancy, unspecified, first trimester: Secondary | ICD-10-CM

## 2021-11-24 DIAGNOSIS — O26891 Other specified pregnancy related conditions, first trimester: Secondary | ICD-10-CM | POA: Insufficient documentation

## 2021-11-24 DIAGNOSIS — Z3A01 Less than 8 weeks gestation of pregnancy: Secondary | ICD-10-CM | POA: Insufficient documentation

## 2021-11-24 LAB — WET PREP, GENITAL
Clue Cells Wet Prep HPF POC: NONE SEEN
Sperm: NONE SEEN
Trich, Wet Prep: NONE SEEN
WBC, Wet Prep HPF POC: <10 (ref <10)
Yeast Wet Prep HPF POC: NONE SEEN

## 2021-11-24 LAB — URINALYSIS, ROUTINE W REFLEX MICROSCOPIC
Bilirubin Urine: NEGATIVE
Glucose, UA: NEGATIVE mg/dL
Hgb urine dipstick: NEGATIVE
Ketones, ur: NEGATIVE mg/dL
Leukocytes,Ua: NEGATIVE
Nitrite: NEGATIVE
Protein, ur: NEGATIVE mg/dL
Specific Gravity, Urine: 1.005 (ref 1.005–1.030)
pH: 6 (ref 5.0–8.0)

## 2021-11-24 LAB — CBC
HCT: 38.7 % (ref 36.0–46.0)
Hemoglobin: 13 g/dL (ref 12.0–15.0)
MCH: 30.7 pg (ref 26.0–34.0)
MCHC: 33.6 g/dL (ref 30.0–36.0)
MCV: 91.3 fL (ref 80.0–100.0)
Platelets: 221 10*3/uL (ref 150–400)
RBC: 4.24 MIL/uL (ref 3.87–5.11)
RDW: 12.3 % (ref 11.5–15.5)
WBC: 7.8 10*3/uL (ref 4.0–10.5)
nRBC: 0 % (ref 0.0–0.2)

## 2021-11-24 LAB — HCG, QUANTITATIVE, PREGNANCY: hCG, Beta Chain, Quant, S: 3325 m[IU]/mL — ABNORMAL HIGH (ref <5)

## 2021-11-24 NOTE — MAU Note (Signed)
...  Traci King is a 31 y.o. at approximately [redacted]w[redacted]d here in MAU reporting: Intermittent lower abdominal cramping that began while at work this morning around 0930. She reports she works at [employer] and occasionally has to lift heavier boxes. Denies VB, vaginal itching, vaginal odors, and vaginal discharge. Last IC June 9th.  LMP: 10/01/2021 Onset of complaint: Today at 0930  Pain score:  5/10 lower abdomen  Lab orders placed from triage: POCT Preg, UA

## 2021-11-24 NOTE — MAU Provider Note (Signed)
History     CSN: [id number]  Arrival date and time: 11/24/21 1614  Chief Complaint  Patient presents with   Abdominal Pain   HPI  Traci King is a 31 y.o. [id number] at [redacted]w[redacted]d who presents for evaluation of lower abdominal cramping. Patient reports it started while she was at work today. She reports the pain is intermittent. Patient rates the pain as a 4/10 and has not tried anything for the pain. She denies any vaginal bleeding, discharge, and leaking of fluid. Denies any constipation, diarrhea or any urinary complaints.   OB History     Gravida  3   Para  1   Term  0   Preterm  1   AB  1   Living  1      SAB  0   IAB  1   Ectopic  0   Multiple  0   Live Births  1           Past Medical History:  Diagnosis Date   Abnormal Pap smear    Herpes simplex type 1 infection    History of stomach ulcers     Past Surgical History:  Procedure Laterality Date   NO PAST SURGERIES      Family History  Problem Relation Age of Onset   Hypertension Mother    Diabetes Mother    Hypertension Maternal Grandmother    Diabetes Maternal Grandfather     Social History   Tobacco Use   Smoking status: Former    Packs/day: 0.25    Types: Cigarettes    Quit date: 04/09/2011    Years since quitting: 10.6   Smokeless tobacco: Never  Vaping Use   Vaping Use: Never used  Substance Use Topics   Alcohol use: No   Drug use: Yes    Types: Marijuana    Comment: daily    Allergies: No Known Allergies  No medications prior to admission.    Review of Systems  Constitutional: Negative.  Negative for fatigue and fever.  HENT: Negative.    Respiratory: Negative.  Negative for shortness of breath.   Cardiovascular: Negative.  Negative for chest pain.  Gastrointestinal:  Positive for abdominal pain. Negative for constipation, diarrhea, nausea and vomiting.  Genitourinary: Negative.  Negative for dysuria, vaginal bleeding and vaginal discharge.  Neurological:  Negative.  Negative for dizziness and headaches.   Physical Exam   Blood pressure 121/85, pulse 83, temperature 98.8 F (37.1 C), temperature source Oral, resp. rate 14, height 5' 3.5" (1.613 m), weight 52.3 kg, last menstrual period 10/01/2021, SpO2 100 %.  Patient Vitals for the past 24 hrs:  BP Temp Temp src Pulse Resp SpO2 Height Weight  11/24/21 1819 121/85 -- -- -- -- -- -- --  11/24/21 1712 124/78 -- -- 83 -- -- -- --  11/24/21 1652 119/84 98.8 F (37.1 C) Oral (!) 101 14 100 % 5' 3.5" (1.613 m) 52.3 kg    Physical Exam Vitals and nursing note reviewed.  Constitutional:      General: She is not in acute distress.    Appearance: She is well-developed.  HENT:     Head: Normocephalic.  Eyes:     Pupils: Pupils are equal, round, and reactive to light.  Cardiovascular:     Rate and Rhythm: Normal rate and regular rhythm.     Heart sounds: Normal heart sounds.  Pulmonary:     Effort: Pulmonary effort is normal. No respiratory distress.  Breath sounds: Normal breath sounds.  Abdominal:     General: Bowel sounds are normal. There is no distension.     Palpations: Abdomen is soft.     Tenderness: There is no abdominal tenderness.  Skin:    General: Skin is warm and dry.  Neurological:     Mental Status: She is alert and oriented to person, place, and time.  Psychiatric:        Mood and Affect: Mood normal.        Behavior: Behavior normal.        Thought Content: Thought content normal.        Judgment: Judgment normal.       MAU Course  Procedures  Results for orders placed or performed during the hospital encounter of 11/24/21 (from the past 24 hour(s))  Urinalysis, Routine w reflex microscopic Urine, Clean Catch     Status: Abnormal   Collection Time: 11/24/21  4:55 PM  Result Value Ref Range   Color, Urine STRAW (A) YELLOW   APPearance CLEAR CLEAR   Specific Gravity, Urine 1.005 1.005 - 1.030   pH 6.0 5.0 - 8.0   Glucose, UA NEGATIVE NEGATIVE mg/dL    Hgb urine dipstick NEGATIVE NEGATIVE   Bilirubin Urine NEGATIVE NEGATIVE   Ketones, ur NEGATIVE NEGATIVE mg/dL   Protein, ur NEGATIVE NEGATIVE mg/dL   Nitrite NEGATIVE NEGATIVE   Leukocytes,Ua NEGATIVE NEGATIVE  CBC     Status: None   Collection Time: 11/24/21  5:09 PM  Result Value Ref Range   WBC 7.8 4.0 - 10.5 K/uL   RBC 4.24 3.87 - 5.11 MIL/uL   Hemoglobin 13.0 12.0 - 15.0 g/dL   HCT 85.4 62.7 - 03.5 %   MCV 91.3 80.0 - 100.0 fL   MCH 30.7 26.0 - 34.0 pg   MCHC 33.6 30.0 - 36.0 g/dL   RDW 00.9 38.1 - 82.9 %   Platelets 221 150 - 400 K/uL   nRBC 0.0 0.0 - 0.2 %  hCG, quantitative, pregnancy     Status: Abnormal   Collection Time: 11/24/21  5:09 PM  Result Value Ref Range   hCG, Beta Chain, Quant, S 3,325 (H) <5 mIU/mL  Wet prep, genital     Status: None   Collection Time: 11/24/21  5:17 PM  Result Value Ref Range   Yeast Wet Prep HPF POC NONE SEEN NONE SEEN   Trich, Wet Prep NONE SEEN NONE SEEN   Clue Cells Wet Prep HPF POC NONE SEEN NONE SEEN   WBC, Wet Prep HPF POC <10 <10   Sperm NONE SEEN      US OB LESS THAN 14 WEEKS WITH OB TRANSVAGINAL  Result Date: 11/24/2021 CLINICAL DATA:  Abdominal pain affecting first trimester of pregnancy, LMP 10/01/2021, quantitative beta hCG pending EXAM: OBSTETRIC <14 WK [place] AND TRANSVAGINAL OB US TECHNIQUE: Both transabdominal and transvaginal ultrasound examinations were performed for complete evaluation of the gestation as well as the maternal uterus, adnexal regions, and pelvic cul-de-sac. Transvaginal technique was performed to assess early pregnancy. COMPARISON:  None FINDINGS: Intrauterine gestational sac: Present, single, minimally irregular Yolk sac:  Present Embryo:  Not identified Cardiac Activity: N/A Heart Rate: N/A  bpm MSD: 5.4 mm   5 w   2 d CRL:    mm    w    d                  [place] EDC: Subchorionic hemorrhage:  None visualized. Maternal uterus/adnexae:  Uterus retroverted, otherwise normal appearance. LEFT ovary normal size and  morphology, 2.4 x 3.4 x 2.2 cm. RIGHT ovary measures 3.6 x 2.9 x 3.4 cm and contains a corpus luteal cyst. Trace free pelvic fluid. No adnexal masses. IMPRESSION: Intrauterine gestational sac containing a yolk sac. No fetal pole is identified to establish viability. May consider follow-up ultrasound in 14 days to establish viability if clinically indicated. Electronically Signed   By: Ulyses Southward M.D.   On: 11/24/2021 18:01     MDM Labs ordered and reviewed.   UA, UPT CBC, HCG ABO/Rh- O Pos Wet prep and gc/chlamydia US OB Comp Less 14 weeks with Transvaginal  CNM independently reviewed the imaging ordered. Imaging show intrauterine gestational sac with yolk sac  CNM reviewed results with patient and recommendation for repeat ultrasound in 2 weeks. Appointment made at [facility] for viability scan  Assessment and Plan   1. Normal intrauterine pregnancy on prenatal ultrasound in first trimester   2. [redacted] weeks gestation of pregnancy     -Discharge home in stable condition -First trimester precautions discussed -Patient advised to follow-up with [facility] in 2 weeks as scheduled for repeat ultrasound -Patient may return to MAU as needed or if her condition were to change or worsen  Rolm Bookbinder, CNM 11/24/2021, 6:58 PM

## 2021-11-24 NOTE — Discharge Instructions (Signed)

## 2021-11-26 LAB — GC/CHLAMYDIA PROBE AMP (~~LOC~~) NOT AT ARMC
Chlamydia: NEGATIVE
Comment: NEGATIVE
Comment: NORMAL
Neisseria Gonorrhea: NEGATIVE

## 2021-12-06 ENCOUNTER — Inpatient Hospital Stay (HOSPITAL_COMMUNITY): Payer: Medicaid Other

## 2021-12-06 ENCOUNTER — Encounter (HOSPITAL_COMMUNITY): Payer: Self-pay | Admitting: Obstetrics and Gynecology

## 2021-12-06 ENCOUNTER — Inpatient Hospital Stay (HOSPITAL_COMMUNITY)
Admission: AD | Admit: 2021-12-06 | Discharge: 2021-12-06 | Disposition: A | Discharge status: Home / Self Care | Payer: Medicaid Other | Attending: Obstetrics and Gynecology | Admitting: Obstetrics and Gynecology

## 2021-12-06 DIAGNOSIS — Z3491 Encounter for supervision of normal pregnancy, unspecified, first trimester: Secondary | ICD-10-CM

## 2021-12-06 DIAGNOSIS — R1030 Lower abdominal pain, unspecified: Secondary | ICD-10-CM | POA: Diagnosis not present

## 2021-12-06 DIAGNOSIS — O208 Other hemorrhage in early pregnancy: Secondary | ICD-10-CM | POA: Insufficient documentation

## 2021-12-06 DIAGNOSIS — O418X10 Other specified disorders of amniotic fluid and membranes, first trimester, not applicable or unspecified: Secondary | ICD-10-CM

## 2021-12-06 DIAGNOSIS — Z6790 Unspecified blood type, Rh positive: Secondary | ICD-10-CM | POA: Diagnosis not present

## 2021-12-06 DIAGNOSIS — Z3A01 Less than 8 weeks gestation of pregnancy: Secondary | ICD-10-CM

## 2021-12-06 DIAGNOSIS — Z3A09 9 weeks gestation of pregnancy: Secondary | ICD-10-CM | POA: Diagnosis not present

## 2021-12-06 HISTORY — DX: Other specified health status: Z78.9

## 2021-12-06 LAB — CBC
HCT: 36.4 % (ref 36.0–46.0)
Hemoglobin: 12.4 g/dL (ref 12.0–15.0)
MCH: 30.9 pg (ref 26.0–34.0)
MCHC: 34.1 g/dL (ref 30.0–36.0)
MCV: 90.8 fL (ref 80.0–100.0)
Platelets: 209 10*3/uL (ref 150–400)
RBC: 4.01 MIL/uL (ref 3.87–5.11)
RDW: 12.1 % (ref 11.5–15.5)
WBC: 8.3 10*3/uL (ref 4.0–10.5)
nRBC: 0 % (ref 0.0–0.2)

## 2021-12-06 LAB — URINALYSIS, ROUTINE W REFLEX MICROSCOPIC
Bilirubin Urine: NEGATIVE
Glucose, UA: NEGATIVE mg/dL
Ketones, ur: NEGATIVE mg/dL
Leukocytes,Ua: NEGATIVE
Nitrite: NEGATIVE
Protein, ur: 100 mg/dL — AB
RBC / HPF: >50 RBC/hpf — ABNORMAL HIGH (ref 0–5)
Specific Gravity, Urine: 1.02 (ref 1.005–1.030)
pH: 7 (ref 5.0–8.0)

## 2021-12-06 LAB — WET PREP, GENITAL
Clue Cells Wet Prep HPF POC: NONE SEEN
Sperm: NONE SEEN
Trich, Wet Prep: NONE SEEN
WBC, Wet Prep HPF POC: <10 (ref <10)
Yeast Wet Prep HPF POC: NONE SEEN

## 2021-12-06 LAB — HCG, QUANTITATIVE, PREGNANCY: hCG, Beta Chain, Quant, S: 35964 m[IU]/mL — ABNORMAL HIGH (ref <5)

## 2021-12-06 NOTE — MAU Provider Note (Signed)
History     CSN: [id number]  Arrival date and time: 12/06/21 1104   Event Date/Time   First Provider Initiated Contact with Patient 12/06/21 1143      Chief Complaint  Patient presents with   Vaginal Bleeding   31 y.o. @[redacted]w[redacted]d  by LMP presenting with blood tinged vaginal fluid. Reports episode this am. Last IC was yesterday. Endorses intermittent episodes of thick white discharge. No concerns for STI but would like to be retested. Denies urinary sx other than frequency. Endorses mild low abdominal pain described as stretching. Rates pain 4/10. Has not treated it.     OB History     Gravida  3   Para  1   Term  0   Preterm  1   AB  1   Living  1      SAB  0   IAB  1   Ectopic  0   Multiple  0   Live Births  1           Past Medical History:  Diagnosis Date   Abnormal Pap smear    Herpes simplex type 1 infection    History of stomach ulcers    Medical history non-contributory     Past Surgical History:  Procedure Laterality Date   NO PAST SURGERIES      Family History  Problem Relation Age of Onset   Hypertension Mother    Diabetes Mother    Hypertension Maternal Grandmother    Diabetes Maternal Grandfather     Social History   Tobacco Use   Smoking status: Former    Packs/day: 0.25    Types: Cigarettes    Quit date: 04/09/2011    Years since quitting: 10.6   Smokeless tobacco: Never  Vaping Use   Vaping Use: Never used  Substance Use Topics   Alcohol use: No   Drug use: Not Currently    Types: Marijuana    Comment: daily    Allergies: No Known Allergies  Medications Prior to Admission  Medication Sig Dispense Refill Last Dose   Prenatal Vit-Fe Fumarate-FA (PRENATAL MULTIVITAMIN) TABS tablet Take 1 tablet by mouth daily at 12 noon.   12/06/2021   cetirizine (ZYRTEC ALLERGY) 10 MG tablet Take 1 tablet (10 mg total) by mouth daily. 30 tablet 0    cyclobenzaprine (FLEXERIL) 10 MG tablet Take 1 tablet (10 mg total) by mouth 3  (three) times daily. 20 tablet 0    fluticasone (FLONASE) 50 MCG/ACT nasal spray Place 2 sprays into both nostrils daily. 16 g 12    lidocaine (XYLOCAINE) 2 % solution Use as directed 15 mLs in the mouth or throat as needed for mouth pain. 100 mL 0    ondansetron (ZOFRAN ODT) 8 MG disintegrating tablet 8mg  ODT q4 hours prn nausea (Patient not taking: Reported on 09/21/2017) 4 tablet 0     Review of Systems  Gastrointestinal:  Positive for abdominal pain. Negative for nausea and vomiting.  Genitourinary:  Positive for frequency and vaginal bleeding. Negative for dysuria and urgency.   Physical Exam   Blood pressure 120/70, pulse 78, temperature 98.9 F (37.2 C), temperature source Oral, resp. rate 16, height 5\' 3"  (1.6 m), weight 52.2 kg, last menstrual period 10/01/2021, SpO2 100 %.  Physical Exam Vitals and nursing note reviewed.  Constitutional:      General: She is not in acute distress.    Appearance: Normal appearance.  HENT:     Head: Normocephalic and atraumatic.  Cardiovascular:     Rate and Rhythm: Normal rate.  Pulmonary:     Effort: Pulmonary effort is normal. No respiratory distress.  Abdominal:     General: There is no distension.     Palpations: Abdomen is soft. There is no mass.     Tenderness: There is no abdominal tenderness. There is no guarding or rebound.     Hernia: No hernia is present.  Musculoskeletal:        General: Normal range of motion.     Cervical back: Normal range of motion.  Skin:    General: Skin is warm and dry.  Neurological:     General: No focal deficit present.     Mental Status: She is alert and oriented to person, place, and time.  Psychiatric:        Mood and Affect: Mood normal.        Behavior: Behavior normal.    Results for orders placed or performed during the hospital encounter of 12/06/21 (from the past 24 hour(s))  Urinalysis, Routine w reflex microscopic     Status: Abnormal   Collection Time: 12/06/21 11:29 AM  Result  Value Ref Range   Color, Urine AMBER (A) YELLOW   APPearance CLOUDY (A) CLEAR   Specific Gravity, Urine 1.020 1.005 - 1.030   pH 7.0 5.0 - 8.0   Glucose, UA NEGATIVE NEGATIVE mg/dL   Hgb urine dipstick LARGE (A) NEGATIVE   Bilirubin Urine NEGATIVE NEGATIVE   Ketones, ur NEGATIVE NEGATIVE mg/dL   Protein, ur 109 (A) NEGATIVE mg/dL   Nitrite NEGATIVE NEGATIVE   Leukocytes,Ua NEGATIVE NEGATIVE   RBC / HPF >50 (H) 0 - 5 RBC/hpf   WBC, UA 0-5 0 - 5 WBC/hpf   Bacteria, UA MANY (A) NONE SEEN   Squamous Epithelial / LPF 0-5 0 - 5   Mucus PRESENT    Sperm, UA PRESENT   hCG, quantitative, pregnancy     Status: Abnormal   Collection Time: 12/06/21 11:51 AM  Result Value Ref Range   hCG, Beta Chain, Quant, S 35,964 (H) <5 mIU/mL  CBC     Status: None   Collection Time: 12/06/21 11:51 AM  Result Value Ref Range   WBC 8.3 4.0 - 10.5 K/uL   RBC 4.01 3.87 - 5.11 MIL/uL   Hemoglobin 12.4 12.0 - 15.0 g/dL   HCT 32.3 55.7 - 32.2 %   MCV 90.8 80.0 - 100.0 fL   MCH 30.9 26.0 - 34.0 pg   MCHC 34.1 30.0 - 36.0 g/dL   RDW 02.5 42.7 - 06.2 %   Platelets 209 150 - 400 K/uL   nRBC 0.0 0.0 - 0.2 %  Wet prep, genital     Status: None   Collection Time: 12/06/21 12:06 PM  Result Value Ref Range   Yeast Wet Prep HPF POC NONE SEEN NONE SEEN   Trich, Wet Prep NONE SEEN NONE SEEN   Clue Cells Wet Prep HPF POC NONE SEEN NONE SEEN   WBC, Wet Prep HPF POC <10 <10   Sperm NONE SEEN    US OB Transvaginal  Result Date: 12/06/2021 CLINICAL DATA:  Vaginal bleeding. Estimated gestational age [redacted] weeks, 0 days by most recent ultrasound. EXAM: TRANSVAGINAL OB ULTRASOUND TECHNIQUE: Transvaginal ultrasound was performed for complete evaluation of the gestation as well as the maternal uterus, adnexal regions, and pelvic cul-de-sac. COMPARISON:  OB ultrasound dated November 24, 2021. FINDINGS: Intrauterine gestational sac: Single. Yolk sac:  Visualized. Embryo:  Visualized.  Cardiac Activity: Visualized. Heart Rate: 130 bpm  CRL:   9.1 mm   6 w 6 d                  [place] EDC: 07/26/2022 Subchorionic hemorrhage: 2.4 x 1.5 x 2.7 cm subchorionic hemorrhage. Maternal uterus/adnexae: Unremarkable.  Right ovarian corpus luteum. IMPRESSION: 1. Single live intrauterine pregnancy with estimated gestational age of [redacted] weeks, 6 days. 2. 2.7 cm subchorionic hemorrhage. Electronically Signed   By: Obie Dredge M.D.   On: 12/06/2021 13:00    MAU Course  Procedures  MDM Labs and [place] ordered and reviewed. Viable IUP on [place] with small SCH, likely cause for VB. Discussed findings with pt. UA with many bacteria, frequency only sx, culture ordered. Stable for discharge.   Assessment and Plan   1. [redacted] weeks gestation of pregnancy   2. Normal intrauterine pregnancy on prenatal ultrasound in first trimester   3. Subchorionic hemorrhage of placenta in first trimester, single or unspecified fetus   4. Blood type, Rh positive    Discharge home Follow up at [facility]- in 3 weeks to start care SAB precautions Pelvic rest  Allergies as of 12/06/2021   No Known Allergies      Medication List     STOP taking these medications    cyclobenzaprine 10 MG tablet Commonly known as: FLEXERIL   lidocaine 2 % solution Commonly known as: XYLOCAINE   ondansetron 8 MG disintegrating tablet Commonly known as: Zofran ODT       TAKE these medications    cetirizine 10 MG tablet Commonly known as: ZyrTEC Allergy Take 1 tablet (10 mg total) by mouth daily.   fluticasone 50 MCG/ACT nasal spray Commonly known as: FLONASE Place 2 sprays into both nostrils daily.   prenatal multivitamin Tabs tablet Take 1 tablet by mouth daily at 12 noon.        Donette Larry, CNM 12/06/2021, 12:32 PM

## 2021-12-06 NOTE — MAU Note (Signed)
.  Traci King is a 31 y.o. at [redacted]w[redacted]d here in MAU reporting: small amount of pink vaginal bleeding today when grocery shopping around 1015. She reports that she coughed and then "squirted" and then she checked and found the bleeding. Reports that  Denies pain at this time but did have pain when she was on her way to the hospital.   Onset of complaint: today Pain score: 0/10 Vitals:   12/06/21 1123  BP: 117/61  Pulse: 82  Resp: 16  Temp: 98.9 F (37.2 C)  SpO2: 100%      Lab orders placed from triage:  UA

## 2021-12-06 NOTE — MAU Provider Note (Addendum)
History   .Traci King is a 31 y.o. at [redacted]w[redacted]d presenting to MAU with fluid leakage. She was in the grocery store this morning around 10:30am when she coughed and it "felt like she'd wet herself", she soaked through her underwear and pants, and the fluid was <1/2 cup and mostly clear but tinged red. She endorses sexual intercourse yesterday. She also reports having several episodes of white, cloudy discharge for the past few days. She denies dysuria, itching, and urinary urgency. She endorses urinary frequency.   She describes having mild abdominal pain that she describes feels like "stretching" and has been present for most of her pregnancy. She denies nausea and vomiting, and endorses some mild constipation. She would like to be tested for STIs today.  Ultrasound on 11/24/21 showed intrauterine gestational sac containing a yolk sac but did not identify a fetal pole.   Here in the MAU, she is continuing to leak minimal blood-tinged fluid.   CSN: [id number]  Arrival date and time: 12/06/21 1104   Event Date/Time   First Provider Initiated Contact with Patient 12/06/21 1143      Chief Complaint  Patient presents with   Vaginal Bleeding    Past Medical History:  Diagnosis Date   Abnormal Pap smear    Herpes simplex type 1 infection    History of stomach ulcers    Medical history non-contributory     Past Surgical History:  Procedure Laterality Date   NO PAST SURGERIES      Family History  Problem Relation Age of Onset   Hypertension Mother    Diabetes Mother    Hypertension Maternal Grandmother    Diabetes Maternal Grandfather     Social History   Tobacco Use   Smoking status: Former    Packs/day: 0.25    Types: Cigarettes    Quit date: 04/09/2011    Years since quitting: 10.6   Smokeless tobacco: Never  Vaping Use   Vaping Use: Never used  Substance Use Topics   Alcohol use: No   Drug use: Not Currently    Types: Marijuana    Comment: daily    Allergies:  No Known Allergies  Medications Prior to Admission  Medication Sig Dispense Refill Last Dose   Prenatal Vit-Fe Fumarate-FA (PRENATAL MULTIVITAMIN) TABS tablet Take 1 tablet by mouth daily at 12 noon.   12/06/2021   cetirizine (ZYRTEC ALLERGY) 10 MG tablet Take 1 tablet (10 mg total) by mouth daily. 30 tablet 0    cyclobenzaprine (FLEXERIL) 10 MG tablet Take 1 tablet (10 mg total) by mouth 3 (three) times daily. 20 tablet 0    fluticasone (FLONASE) 50 MCG/ACT nasal spray Place 2 sprays into both nostrils daily. 16 g 12    lidocaine (XYLOCAINE) 2 % solution Use as directed 15 mLs in the mouth or throat as needed for mouth pain. 100 mL 0    ondansetron (ZOFRAN ODT) 8 MG disintegrating tablet 8mg  ODT q4 hours prn nausea (Patient not taking: Reported on 09/21/2017) 4 tablet 0     Review of Systems  Constitutional:  Negative for fatigue and fever.  Respiratory:  Negative for shortness of breath.   Cardiovascular:  Negative for chest pain.  Gastrointestinal:  Positive for abdominal pain. Negative for constipation, diarrhea, nausea and vomiting.  Genitourinary:  Positive for frequency, vaginal bleeding and vaginal discharge. Negative for decreased urine volume, dysuria, urgency and vaginal pain.  Neurological:  Negative for numbness and headaches.   Physical Exam   Blood  pressure 117/61, pulse 82, temperature 98.9 F (37.2 C), temperature source Oral, resp. rate 16, height 5\' 3"  (1.6 m), weight 52.2 kg, last menstrual period 10/01/2021, SpO2 100 %.  Physical Exam Gen: well-appearing, in no acute distress Resp: No increased work of breathing Abd: non-tender to palpation Extremities: no gross motor abnormalities Neuro: alert and oriented x3 Psych: mood and behavior normal   MAU Course  Procedures  MDM - Vaginal bleeding & abdominal pain - will workup to evaluate for cause. Differential includes SAB, urinary incontinence, post-coital bleeding, subchorionic hematoma, STI or yeast/BV. Bleeding  likely due to subchorionic hemorrhage seen on 12/01/2021. - Recheck hCG and perform ultrasound to evaluate viability - Wet prep and GC given patient desire for STI testing and recent vaginal discharge - UA given recent urinary urgency and possible episode of urinary incontinence - Dating ultrasound - showed [redacted]w[redacted]d and subchorionic hematoma  - UA showed many bacteria, because asymptomatic, will culture and if +, call to inform patient and prescribe antibiotics - Given incorrect dating, contacted prenatal office to reschedule initial OB around ~10 weeks.   Assessment and Plan  6w 6d gestation, care established with [facility]. Rh positive.   2.  Subchorionic hemorrhage: 2.4 x 1.5 x 2.7 cm. Discharged home and advised pelvic rest. Return precautions around vaginal bleeding and severe abdominal cramping discussed with patient.   3. Abnormal UA - asymptomatic, culture pending.   [name] 12/06/2021, 12:07 PM

## 2021-12-07 LAB — GC/CHLAMYDIA PROBE AMP (~~LOC~~) NOT AT ARMC
Chlamydia: NEGATIVE
Comment: NEGATIVE
Comment: NORMAL
Neisseria Gonorrhea: NEGATIVE

## 2021-12-08 LAB — CULTURE, OB URINE: Culture: 3000 — AB

## 2021-12-09 ENCOUNTER — Encounter: Payer: Self-pay | Admitting: Student

## 2021-12-09 DIAGNOSIS — R8271 Bacteriuria: Secondary | ICD-10-CM | POA: Insufficient documentation

## 2021-12-11 ENCOUNTER — Other Ambulatory Visit: Payer: Self-pay

## 2021-12-12 ENCOUNTER — Encounter: Payer: Medicaid Other | Admitting: Obstetrics and Gynecology

## 2022-01-15 ENCOUNTER — Encounter: Payer: Self-pay | Admitting: Obstetrics and Gynecology

## 2022-01-15 ENCOUNTER — Ambulatory Visit (INDEPENDENT_AMBULATORY_CARE_PROVIDER_SITE_OTHER): Payer: Medicaid Other | Admitting: Obstetrics and Gynecology

## 2022-01-15 VITALS — BP 118/84 | HR 80 | Wt 121.0 lb

## 2022-01-15 DIAGNOSIS — O09891 Supervision of other high risk pregnancies, first trimester: Secondary | ICD-10-CM | POA: Diagnosis not present

## 2022-01-15 DIAGNOSIS — O09899 Supervision of other high risk pregnancies, unspecified trimester: Secondary | ICD-10-CM | POA: Insufficient documentation

## 2022-01-15 DIAGNOSIS — O219 Vomiting of pregnancy, unspecified: Secondary | ICD-10-CM | POA: Diagnosis not present

## 2022-01-15 DIAGNOSIS — Z3A12 12 weeks gestation of pregnancy: Secondary | ICD-10-CM

## 2022-01-15 DIAGNOSIS — O0993 Supervision of high risk pregnancy, unspecified, third trimester: Secondary | ICD-10-CM | POA: Insufficient documentation

## 2022-01-15 DIAGNOSIS — Z3480 Encounter for supervision of other normal pregnancy, unspecified trimester: Secondary | ICD-10-CM

## 2022-01-15 DIAGNOSIS — F1290 Cannabis use, unspecified, uncomplicated: Secondary | ICD-10-CM

## 2022-01-15 DIAGNOSIS — M7989 Other specified soft tissue disorders: Secondary | ICD-10-CM

## 2022-01-15 DIAGNOSIS — R112 Nausea with vomiting, unspecified: Secondary | ICD-10-CM | POA: Insufficient documentation

## 2022-01-15 DIAGNOSIS — R8271 Bacteriuria: Secondary | ICD-10-CM

## 2022-01-15 DIAGNOSIS — Z8619 Personal history of other infectious and parasitic diseases: Secondary | ICD-10-CM | POA: Insufficient documentation

## 2022-01-15 DIAGNOSIS — F1210 Cannabis abuse, uncomplicated: Secondary | ICD-10-CM

## 2022-01-15 DIAGNOSIS — Z3481 Encounter for supervision of other normal pregnancy, first trimester: Secondary | ICD-10-CM

## 2022-01-15 DIAGNOSIS — Z3490 Encounter for supervision of normal pregnancy, unspecified, unspecified trimester: Secondary | ICD-10-CM | POA: Insufficient documentation

## 2022-01-15 HISTORY — DX: Nausea with vomiting, unspecified: R11.2

## 2022-01-15 HISTORY — DX: Cannabis use, unspecified, uncomplicated: F12.90

## 2022-01-15 MED ORDER — ONDANSETRON HCL 4 MG PO TABS: 4.0000 mg | ORAL_TABLET | Freq: Three times a day (TID) | ORAL | 0 refills | Status: DC | PRN

## 2022-01-15 MED ORDER — VITAFOL GUMMIES 3.33-0.333-34.8 MG PO CHEW: 1.0000 | CHEWABLE_TABLET | Freq: Every day | ORAL | 12 refills | Status: DC

## 2022-01-15 NOTE — Addendum Note (Signed)
Addended by: Kennon Portela on: 01/15/2022 02:49 PM   Modules accepted: Orders

## 2022-01-15 NOTE — Progress Notes (Signed)
New OB Note  01/15/2022   Clinic: Center for [facility]  Chief Complaint:  New OB  History of Present Illness: Ms. Skaff is a 31 y.o. [id number] @ 12/4 weeks (EDC 3/1, based on 6wk u/s) Patient's last menstrual period was 10/01/2021.  Preg complicated by has GBS bacteriuria; Encounter for supervision of normal pregnancy, antepartum; Marijuana abuse; Nausea and vomiting during pregnancy prior to [redacted] weeks gestation; History of herpes genitalis; History of preterm delivery, currently pregnant; and Cannabinoid hyperemesis syndrome on their problem list.   +THC usage for nausea and vomiting of pregnancy. She states that she does get relief with hot, steamy showers.   ROS: A 12-point review of systems was performed and negative, except as stated in the above HPI.  OBGYN History: As per HPI. OB History  Gravida Para Term Preterm AB Living  3 1 0 1 1 1   SAB IAB Ectopic Multiple Live Births  0 1 0 0 1    # Outcome Date GA Lbr Len/2nd Weight Sex Delivery Anes PTL Lv  3 Current           2 Preterm 11/30/11 [redacted]w[redacted]d 29:26 / 00:14 5 lb 6.8 oz (2.46 kg) F Vag-Spont EPI  LIV  1 IAB              Past Medical History: Past Medical History:  Diagnosis Date   Abnormal Pap smear    Herpes simplex type 1 infection    History of stomach ulcers    Medical history non-contributory     Past Surgical History: Past Surgical History:  Procedure Laterality Date   NO PAST SURGERIES      Family History:  Family History  Problem Relation Age of Onset   Hypertension Mother    Diabetes Mother    Hypertension Maternal Grandmother    Diabetes Maternal Grandfather     Social History:  Social History   Socioeconomic History   Marital status: Single    Spouse name: Not on file   Number of children: Not on file   Years of education: Not on file   Highest education level: Not on file  Occupational History   Not on file  Tobacco Use   Smoking status: Former    Packs/day: 0.25     Types: Cigarettes    Quit date: 04/09/2011    Years since quitting: 10.7    Passive exposure: Current   Smokeless tobacco: Never  Vaping Use   Vaping Use: Never used  Substance and Sexual Activity   Alcohol use: No   Drug use: Yes    Types: Marijuana    Comment: daily   Sexual activity: Yes    Partners: Male    Birth control/protection: None  Other Topics Concern   Not on file  Social History Narrative   Not on file   Social Determinants of Health   Financial Resource Strain: Not on file  Food Insecurity: Not on file  Transportation Needs: Not on file  Physical Activity: Not on file  Stress: Not on file  Social Connections: Not on file  Intimate Partner Violence: Not on file    Allergy: No Known Allergies  Current Outpatient Medications: None  Physical Exam:   BP 118/84   Pulse 80   Wt 121 lb (54.9 kg)   LMP 10/01/2021   BMI 21.43 kg/m  Body mass index is 21.43 kg/m. Contractions: Not present FHTs: 150s  General appearance: Well nourished, well developed female in no acute distress.  Neck:  Supple, normal appearance, and no thyromegaly  Cardiovascular: S1, S2 normal, no murmur, rub or gallop, regular rate and rhythm Respiratory:  Clear to auscultation bilateral. Normal respiratory effort Abdomen: positive bowel sounds and no masses, hernias; diffusely non tender to palpation, non distended Breasts: left axilla lump, normal appearing skin, nttp, mobile, non fluctuant, approx 1.5 x 1.5, round. Normal b/l breast and normal right axilla  Neuro/Psych:  Normal mood and affect.  Skin:  Warm and dry.  Lymphatic:  No inguinal lymphadenopathy.   Pelvic exam: is not limited by body habitus EGBUS: within normal limits, Vagina: within normal limits and with no blood in the vault, Cervix: normal appearing cervix without discharge or lesions, closed/long/high, Uterus:  enlarged, c/w 12 week size, and Adnexa:  normal adnexa and no mass, fullness,  tenderness  Laboratory: MAU labs reviewed  Imaging:  No new imaging  Assessment: pt stable  Plan: 1. Supervision of other normal pregnancy, antepartum Offer afp next visit. Routine labs today. Anatomy u/s ordered  2. Marijuana abuse  3. GBS bacteriuria Too low to treat before labor  4. Nausea and vomiting during pregnancy prior to [redacted] weeks gestation  5. History of herpes genitalis Valtrex ppx at 34-35wk  6. History of preterm delivery, currently pregnant 2013, 36/6, PTL and PTB. No current interventions indicated  7. [redacted] weeks gestation of pregnancy  8. Cannabinoid hyperemesis syndrome D/w her that s/s are c/w this. Recommend tapering off and coming off not only for this but also for pregnancy risks. Zofran (non sedating) sent in for her  10. Left Axilia Lump Pt states she's had this pre pregnancy off and on. Non concerning but left axilla and breast u/s ordered.   Problem list reviewed and updated.  Follow up in 4 weeks.  >50% of 40 min visit spent on counseling and coordination of care.     Cornelia Copa MD Attending Center for [facility])

## 2022-01-16 ENCOUNTER — Encounter: Payer: Self-pay | Admitting: Obstetrics and Gynecology

## 2022-01-16 LAB — CBC/D/PLT+RPR+RH+ABO+RUBIGG...
Antibody Screen: NEGATIVE
Basophils Absolute: 0 10*3/uL (ref 0.0–0.2)
Basos: 0 %
EOS (ABSOLUTE): 0.1 10*3/uL (ref 0.0–0.4)
Eos: 1 %
HCV Ab: NONREACTIVE
HIV Screen 4th Generation wRfx: NONREACTIVE
Hematocrit: 37.7 % (ref 34.0–46.6)
Hemoglobin: 12.6 g/dL (ref 11.1–15.9)
Hepatitis B Surface Ag: NEGATIVE
Immature Grans (Abs): 0 10*3/uL (ref 0.0–0.1)
Immature Granulocytes: 0 %
Lymphocytes Absolute: 2 10*3/uL (ref 0.7–3.1)
Lymphs: 27 %
MCH: 30.8 pg (ref 26.6–33.0)
MCHC: 33.4 g/dL (ref 31.5–35.7)
MCV: 92 fL (ref 79–97)
Monocytes Absolute: 0.6 10*3/uL (ref 0.1–0.9)
Monocytes: 8 %
Neutrophils Absolute: 4.7 10*3/uL (ref 1.4–7.0)
Neutrophils: 64 %
Platelets: 249 10*3/uL (ref 150–450)
RBC: 4.09 x10E6/uL (ref 3.77–5.28)
RDW: 12.5 % (ref 11.7–15.4)
RPR Ser Ql: NONREACTIVE
Rh Factor: POSITIVE
Rubella Antibodies, IGG: 1.56 index (ref >0.99)
WBC: 7.3 10*3/uL (ref 3.4–10.8)

## 2022-01-16 LAB — TSH RFX ON ABNORMAL TO FREE T4: TSH: 0.674 u[IU]/mL (ref 0.450–4.500)

## 2022-01-16 LAB — HCV INTERPRETATION

## 2022-01-17 ENCOUNTER — Other Ambulatory Visit: Payer: Self-pay | Admitting: *Deleted

## 2022-01-17 MED ORDER — PRENATAL 28-0.8 MG PO TABS: 1.0000 | ORAL_TABLET | Freq: Every day | ORAL | 12 refills | Status: DC

## 2022-01-22 LAB — PANORAMA PRENATAL TEST FULL PANEL:PANORAMA TEST PLUS 5 ADDITIONAL MICRODELETIONS: FETAL FRACTION: 7.9

## 2022-01-23 LAB — HORIZON CUSTOM: REPORT SUMMARY: NEGATIVE

## 2022-01-27 ENCOUNTER — Encounter (HOSPITAL_COMMUNITY): Payer: Self-pay | Admitting: Obstetrics & Gynecology

## 2022-01-27 ENCOUNTER — Inpatient Hospital Stay (HOSPITAL_COMMUNITY)
Admission: AD | Admit: 2022-01-27 | Discharge: 2022-01-27 | Disposition: A | Discharge status: Home / Self Care | Payer: Medicaid Other | Attending: Obstetrics & Gynecology | Admitting: Obstetrics & Gynecology

## 2022-01-27 ENCOUNTER — Other Ambulatory Visit: Payer: Self-pay

## 2022-01-27 DIAGNOSIS — O26892 Other specified pregnancy related conditions, second trimester: Secondary | ICD-10-CM | POA: Insufficient documentation

## 2022-01-27 DIAGNOSIS — Z3A14 14 weeks gestation of pregnancy: Secondary | ICD-10-CM | POA: Diagnosis not present

## 2022-01-27 DIAGNOSIS — R519 Headache, unspecified: Secondary | ICD-10-CM | POA: Diagnosis not present

## 2022-01-27 LAB — CBC WITH DIFFERENTIAL/PLATELET
Abs Immature Granulocytes: 0.01 10*3/uL (ref 0.00–0.07)
Basophils Absolute: 0 10*3/uL (ref 0.0–0.1)
Basophils Relative: 1 %
Eosinophils Absolute: 0.2 10*3/uL (ref 0.0–0.5)
Eosinophils Relative: 2 %
HCT: 32 % — ABNORMAL LOW (ref 36.0–46.0)
Hemoglobin: 11 g/dL — ABNORMAL LOW (ref 12.0–15.0)
Immature Granulocytes: 0 %
Lymphocytes Relative: 32 %
Lymphs Abs: 2.3 10*3/uL (ref 0.7–4.0)
MCH: 31.8 pg (ref 26.0–34.0)
MCHC: 34.4 g/dL (ref 30.0–36.0)
MCV: 92.5 fL (ref 80.0–100.0)
Monocytes Absolute: 0.7 10*3/uL (ref 0.1–1.0)
Monocytes Relative: 9 %
Neutro Abs: 4 10*3/uL (ref 1.7–7.7)
Neutrophils Relative %: 56 %
Platelets: 225 10*3/uL (ref 150–400)
RBC: 3.46 MIL/uL — ABNORMAL LOW (ref 3.87–5.11)
RDW: 13.1 % (ref 11.5–15.5)
WBC: 7.2 10*3/uL (ref 4.0–10.5)
nRBC: 0 % (ref 0.0–0.2)

## 2022-01-27 LAB — COMPREHENSIVE METABOLIC PANEL
ALT: 17 U/L (ref 0–44)
AST: 19 U/L (ref 15–41)
Albumin: 3.3 g/dL — ABNORMAL LOW (ref 3.5–5.0)
Alkaline Phosphatase: 45 U/L (ref 38–126)
Anion gap: 7 (ref 5–15)
BUN: 8 mg/dL (ref 6–20)
CO2: 23 mmol/L (ref 22–32)
Calcium: 9 mg/dL (ref 8.9–10.3)
Chloride: 105 mmol/L (ref 98–111)
Creatinine, Ser: 0.46 mg/dL (ref 0.44–1.00)
GFR, Estimated: >60 mL/min (ref >60)
Glucose, Bld: 82 mg/dL (ref 70–99)
Potassium: 3.3 mmol/L — ABNORMAL LOW (ref 3.5–5.1)
Sodium: 135 mmol/L (ref 135–145)
Total Bilirubin: 0.3 mg/dL (ref 0.3–1.2)
Total Protein: 6 g/dL — ABNORMAL LOW (ref 6.5–8.1)

## 2022-01-27 LAB — URINALYSIS, ROUTINE W REFLEX MICROSCOPIC
Bilirubin Urine: NEGATIVE
Glucose, UA: NEGATIVE mg/dL
Hgb urine dipstick: NEGATIVE
Ketones, ur: NEGATIVE mg/dL
Leukocytes,Ua: NEGATIVE
Nitrite: NEGATIVE
Protein, ur: NEGATIVE mg/dL
Specific Gravity, Urine: 1.006 (ref 1.005–1.030)
pH: 5 (ref 5.0–8.0)

## 2022-01-27 MED ORDER — LACTATED RINGERS IV BOLUS
1000.0000 mL | Freq: Once | INTRAVENOUS | Status: AC
  Administered 2022-01-27: 1000 mL via INTRAVENOUS

## 2022-01-27 MED ORDER — METOCLOPRAMIDE HCL 5 MG/ML IJ SOLN
10.0000 mg | Freq: Once | INTRAMUSCULAR | Status: AC
  Administered 2022-01-27: 10 mg via INTRAVENOUS
  Filled 2022-01-27: qty 2

## 2022-01-27 MED ORDER — METOCLOPRAMIDE HCL 10 MG PO TABS: 10.0000 mg | ORAL_TABLET | Freq: Three times a day (TID) | ORAL | 0 refills | Status: DC

## 2022-01-27 MED ORDER — DIPHENHYDRAMINE HCL 50 MG/ML IJ SOLN
12.5000 mg | Freq: Once | INTRAMUSCULAR | Status: AC
Start: 2022-01-27 — End: 2022-01-27
  Administered 2022-01-27: 12.5 mg via INTRAVENOUS
  Filled 2022-01-27: qty 1

## 2022-01-27 NOTE — MAU Provider Note (Signed)
History     CSN: [id number]  Arrival date and time: 01/27/22 1525   Event Date/Time   First Provider Initiated Contact with Patient 01/27/22 1636      Chief Complaint  Patient presents with   Headache   [name] is a 31 y.o. [id number] at [redacted]w[redacted]d who presents to MAU for headache that she rates as 8/10. Patient states does not have any previous diagnosis of migraine, cluster or other headache issues and denies any changes in vision or concerns other than a headache. Patient states the headache is only on the left half of her head/face and denies any pain on the right side. Patient states she has never had a headache like this before. Patient states she took Tylenol 1000mg  at home x2 which did help to relieve the headache, but did not make it go away completely.   Pt denies VB, LOF, ctx, decreased FM, vaginal discharge/odor/itching. Pt denies N/V, abdominal pain, constipation, diarrhea, or urinary problems. Pt denies fever, chills, fatigue, sweating or changes in appetite. Pt denies SOB or chest pain. Pt denies dizziness, light-headedness, weakness.  Allergies? NKDA Current medications/supplements? PNV    OB History     Gravida  3   Para  1   Term  0   Preterm  1   AB  1   Living  1      SAB  0   IAB  1   Ectopic  0   Multiple  0   Live Births  1           Past Medical History:  Diagnosis Date   Abnormal Pap smear    Herpes simplex type 1 infection    History of stomach ulcers    Medical history non-contributory     Past Surgical History:  Procedure Laterality Date   NO PAST SURGERIES      Family History  Problem Relation Age of Onset   Hypertension Mother    Diabetes Mother    Hypertension Maternal Grandmother    Diabetes Maternal Grandfather     Social History   Tobacco Use   Smoking status: Former    Packs/day: 0.25    Types: Cigarettes    Quit date: 04/09/2011    Years since quitting: 10.8    Passive exposure: Current    Smokeless tobacco: Never  Vaping Use   Vaping Use: Never used  Substance Use Topics   Alcohol use: No   Drug use: Yes    Types: Marijuana    Comment: daily    Allergies: No Known Allergies  Medications Prior to Admission  Medication Sig Dispense Refill Last Dose   Prenatal Vit-Fe Phos-FA-Omega (VITAFOL GUMMIES) 3.33-0.333-34.8 MG CHEW Chew 1 tablet by mouth daily. 90 tablet 12 01/27/2022   ondansetron (ZOFRAN) 4 MG tablet Take 1 tablet (4 mg total) by mouth every 8 (eight) hours as needed for nausea or vomiting. 30 tablet 0    Prenatal 28-0.8 MG TABS Take 1 tablet by mouth daily. 30 tablet 12    Prenatal Vit-Fe Fumarate-FA (PRENATAL MULTIVITAMIN) TABS tablet Take 1 tablet by mouth daily at 12 noon.       Review of Systems  Constitutional:  Negative for chills, diaphoresis, fatigue and fever.  Eyes:  Negative for visual disturbance.  Respiratory:  Negative for shortness of breath.   Cardiovascular:  Negative for chest pain.  Gastrointestinal:  Negative for abdominal pain, constipation, diarrhea, nausea and vomiting.  Genitourinary:  Negative for dysuria,  flank pain, frequency, pelvic pain, urgency, vaginal bleeding and vaginal discharge.  Neurological:  Positive for headaches. Negative for dizziness, weakness and light-headedness.   Physical Exam   Blood pressure 122/72, pulse 76, temperature 98.1 F (36.7 C), temperature source Oral, resp. rate 15, last menstrual period 10/01/2021, SpO2 98 %.  Patient Vitals for the past 24 hrs:  BP Temp Temp src Pulse Resp SpO2  01/27/22 1541 122/72 98.1 F (36.7 C) Oral 76 15 98 %   Physical Exam Vitals and nursing note reviewed.  Constitutional:      General: She is not in acute distress.    Appearance: Normal appearance. She is not ill-appearing, toxic-appearing or diaphoretic.  HENT:     Head: Normocephalic and atraumatic.  Pulmonary:     Effort: Pulmonary effort is normal.  Neurological:     General: No focal deficit present.      Mental Status: She is alert and oriented to person, place, and time.  Psychiatric:        Mood and Affect: Mood normal.        Behavior: Behavior normal.        Thought Content: Thought content normal.        Judgment: Judgment normal.    Results for orders placed or performed during the hospital encounter of 01/27/22 (from the past 24 hour(s))  Urinalysis, Routine w reflex microscopic Urine, Clean Catch     Status: None   Collection Time: 01/27/22  4:53 PM  Result Value Ref Range   Color, Urine YELLOW YELLOW   APPearance CLEAR CLEAR   Specific Gravity, Urine 1.006 1.005 - 1.030   pH 5.0 5.0 - 8.0   Glucose, UA NEGATIVE NEGATIVE mg/dL   Hgb urine dipstick NEGATIVE NEGATIVE   Bilirubin Urine NEGATIVE NEGATIVE   Ketones, ur NEGATIVE NEGATIVE mg/dL   Protein, ur NEGATIVE NEGATIVE mg/dL   Nitrite NEGATIVE NEGATIVE   Leukocytes,Ua NEGATIVE NEGATIVE  CBC with Differential/Platelet     Status: Abnormal   Collection Time: 01/27/22  5:10 PM  Result Value Ref Range   WBC 7.2 4.0 - 10.5 K/uL   RBC 3.46 (L) 3.87 - 5.11 MIL/uL   Hemoglobin 11.0 (L) 12.0 - 15.0 g/dL   HCT 34.2 (L) 87.6 - 81.1 %   MCV 92.5 80.0 - 100.0 fL   MCH 31.8 26.0 - 34.0 pg   MCHC 34.4 30.0 - 36.0 g/dL   RDW 57.2 62.0 - 35.5 %   Platelets 225 150 - 400 K/uL   nRBC 0.0 0.0 - 0.2 %   Neutrophils Relative % 56 %   Neutro Abs 4.0 1.7 - 7.7 K/uL   Lymphocytes Relative 32 %   Lymphs Abs 2.3 0.7 - 4.0 K/uL   Monocytes Relative 9 %   Monocytes Absolute 0.7 0.1 - 1.0 K/uL   Eosinophils Relative 2 %   Eosinophils Absolute 0.2 0.0 - 0.5 K/uL   Basophils Relative 1 %   Basophils Absolute 0.0 0.0 - 0.1 K/uL   Immature Granulocytes 0 %   Abs Immature Granulocytes 0.01 0.00 - 0.07 K/uL  Comprehensive metabolic panel     Status: Abnormal   Collection Time: 01/27/22  5:10 PM  Result Value Ref Range   Sodium 135 135 - 145 mmol/L   Potassium 3.3 (L) 3.5 - 5.1 mmol/L   Chloride 105 98 - 111 mmol/L   CO2 23 22 - 32  mmol/L   Glucose, Bld 82 70 - 99 mg/dL   BUN 8 6 -  20 mg/dL   Creatinine, Ser 6.22 0.44 - 1.00 mg/dL   Calcium 9.0 8.9 - 29.7 mg/dL   Total Protein 6.0 (L) 6.5 - 8.1 g/dL   Albumin 3.3 (L) 3.5 - 5.0 g/dL   AST 19 15 - 41 U/L   ALT 17 0 - 44 U/L   Alkaline Phosphatase 45 38 - 126 U/L   Total Bilirubin 0.3 0.3 - 1.2 mg/dL   GFR, Estimated >98 >92 mL/min   Anion gap 7 5 - 15   MAU Course  Procedures  MDM -HA in pregnancy, likely migrane -10mg  Reglan + 12.5mg  Benadryl + 1L LR given, pt reports HA now relieved -consulted with Dr. in neurology, does not recommend imaging at this time with classic migrainous features and complete resolution of symptoms -UA: WNL -CBC: WNL for pregnancy -CMP: K 3.3, otherwise WNL for pregnancy -pt discharged to home in stable condition  Orders Placed This Encounter  Procedures   Urinalysis, Routine w reflex microscopic Urine, Clean Catch    Standing Status:   Standing    Number of Occurrences:   1   CBC with Differential/Platelet    Standing Status:   Standing    Number of Occurrences:   1   Comprehensive metabolic panel    Standing Status:   Standing    Number of Occurrences:   1   AMB referral to headache clinic    Referral Priority:   Routine    Referral Type:   Consultation    Referred to Provider:   Amada Jupiter, Glyn Ade, PA-C    Number of Visits Requested:   1   Insert peripheral IV    Standing Status:   Standing    Number of Occurrences:   1   Discharge patient    Order Specific Question:   Discharge disposition    Answer:   01-Home or Self Care [1]    Order Specific Question:   Discharge patient date    Answer:   01/27/2022   Meds ordered this encounter  Medications   lactated ringers bolus 1,000 mL   metoCLOPramide (REGLAN) injection 10 mg   diphenhydrAMINE (BENADRYL) injection 12.5 mg   metoCLOPramide (REGLAN) 10 MG tablet    Sig: Take 1 tablet (10 mg total) by mouth 3 (three) times daily with meals.    Dispense:   30 tablet    Refill:  0    Order Specific Question:   Supervising Provider    Answer:   03/29/2022 A [[id number]]   Assessment and Plan   1. Pregnancy headache in second trimester   2. [redacted] weeks gestation of pregnancy     Allergies as of 01/27/2022   No Known Allergies      Medication List     TAKE these medications    metoCLOPramide 10 MG tablet Commonly known as: REGLAN Take 1 tablet (10 mg total) by mouth 3 (three) times daily with meals.   ondansetron 4 MG tablet Commonly known as: Zofran Take 1 tablet (4 mg total) by mouth every 8 (eight) hours as needed for nausea or vomiting.   Prenatal 28-0.8 MG Tabs Take 1 tablet by mouth daily.   prenatal multivitamin Tabs tablet Take 1 tablet by mouth daily at 12 noon.   Vitafol Gummies 3.33-0.333-34.8 MG Chew Chew 1 tablet by mouth daily.       -RX Reglan -migraine instructions given -referral HA clinic -return MAU precautions given -pt discharged to home in stable condition  Traci King 01/27/2022, 7:05 PM

## 2022-01-27 NOTE — MAU Note (Signed)
Pt reports to mau with c/o left sided headache since 0200 this morning.  Pt reports she took 1000mg  of tylenol at 0600 and then again at 1400.  Reports no relief with meds.  Rating pain 8/10. Denies vag bleeding.

## 2022-01-27 NOTE — Discharge Instructions (Signed)
For prevention of migraines in pregnancy: -Magnesium, 400mg  by mouth, once daily -Vitamin B2, 400mg  by mouth, once daily  For treatment of migraines in pregnancy: -take medication at the first sign of the pain of a headache, or the first sign of your aura -start with 1000mg  Tylenol (do not exceed 4000mg  of Tylenol in 24hrs), with Reglan 10mg  -take medication on schedule until headache has resolved plus one dose -if the above regimen does not resolve your headache at all, please come to MAU for additional treatment

## 2022-02-04 ENCOUNTER — Encounter: Payer: Self-pay | Admitting: Obstetrics and Gynecology

## 2022-02-14 ENCOUNTER — Ambulatory Visit (INDEPENDENT_AMBULATORY_CARE_PROVIDER_SITE_OTHER): Payer: Medicaid Other | Admitting: Advanced Practice Midwife

## 2022-02-14 VITALS — BP 109/70 | HR 89 | Wt 126.0 lb

## 2022-02-14 DIAGNOSIS — M7989 Other specified soft tissue disorders: Secondary | ICD-10-CM

## 2022-02-14 DIAGNOSIS — Z3A16 16 weeks gestation of pregnancy: Secondary | ICD-10-CM

## 2022-02-14 DIAGNOSIS — Z3480 Encounter for supervision of other normal pregnancy, unspecified trimester: Secondary | ICD-10-CM

## 2022-02-14 DIAGNOSIS — O26892 Other specified pregnancy related conditions, second trimester: Secondary | ICD-10-CM

## 2022-02-14 DIAGNOSIS — Z3482 Encounter for supervision of other normal pregnancy, second trimester: Secondary | ICD-10-CM

## 2022-02-14 DIAGNOSIS — R519 Headache, unspecified: Secondary | ICD-10-CM

## 2022-02-14 NOTE — Patient Instructions (Signed)

## 2022-02-14 NOTE — Progress Notes (Addendum)
   PRENATAL VISIT NOTE  Subjective:  Traci King is a 31 y.o. [phone] at [redacted]w[redacted]d being seen today for ongoing prenatal care.  She is currently monitored for the following issues for this low-risk pregnancy and has GBS bacteriuria; Encounter for supervision of normal pregnancy, antepartum; Marijuana abuse; Nausea and vomiting during pregnancy prior to [redacted] weeks gestation; History of herpes genitalis; History of preterm delivery, currently pregnant; and Cannabinoid hyperemesis syndrome on their problem list.  Patient reports no complaints.  Contractions: Not present. Vag. Bleeding: None.   . Denies leaking of fluid.   The following portions of the patient's history were reviewed and updated as appropriate: allergies, current medications, past family history, past medical history, past social history, past surgical history and problem list. Problem list updated.  Objective:   Vitals:   02/14/22 0855  BP: 109/70  Pulse: 89  Weight: 126 lb (57.2 kg)    Fetal Status: Fetal Heart Rate (bpm): 150         General:  Alert, oriented and cooperative. Patient is in no acute distress.  Skin: Skin is warm and dry. No rash noted.   Cardiovascular: Normal heart rate noted  Respiratory: Normal respiratory effort, no problems with respiration noted  Abdomen: Soft, gravid, appropriate for gestational age.  Pain/Pressure: Absent     Pelvic: Cervical exam deferred        Extremities: Normal range of motion.  Edema: None  Mental Status: Normal mood and affect. Normal behavior. Normal judgment and thought content.   Assessment and Plan:  Pregnancy: [id number] at [redacted]w[redacted]d  1. Supervision of other normal pregnancy, antepartum - Routine care, welcome to second trimester!!! - Interested in [facility]. Will send contact info closer to third trimester - Anatomy scan scheduled - AFP, Serum, Open Spina Bifida  2. Left axillary swelling - Imaging previous scheduled  3. Pregnancy headache in second  trimester - Not currently a problem  4. [redacted] weeks gestation of pregnancy  - AFP, Serum, Open Spina Bifida  Preterm labor symptoms and general obstetric precautions including but not limited to vaginal bleeding, contractions, leaking of fluid and fetal movement were reviewed in detail with the patient. Please refer to After Visit Summary for other counseling recommendations.  Return in about 4 weeks (around 03/14/2022) for MD or APP.  Future Appointments  Date Time Provider [place]  02/27/2022  2:40 PM GI-BCG DIAG TOMO 1 GI-BCGMM GI-BREAST CE  02/27/2022  2:50 PM GI-BCG [place] 1 GI-BCGUS GI-BREAST CE  02/27/2022  2:55 PM GI-BCG [place] 1 GI-BCGUS GI-BREAST CE  03/04/2022  8:30 AM [facility] US3 [facility]  03/13/2022  8:35 AM Donnamae Jude, MD [facility]  04/10/2022  8:35 AM Donnamae Jude, MD [facility]    Darlina Rumpf, CNM

## 2022-02-17 LAB — AFP, SERUM, OPEN SPINA BIFIDA
AFP MoM: 2.83
AFP Value: 127.9 ng/mL
Gest. Age on Collection Date: 16.6 weeks
Maternal Age At EDD: 33.5 yr
OSBR Risk 1 IN: 315
Test Results:: POSITIVE — AB
Weight: 126 [lb_av]

## 2022-02-18 ENCOUNTER — Other Ambulatory Visit: Payer: Self-pay | Admitting: *Deleted

## 2022-02-18 ENCOUNTER — Encounter: Payer: Self-pay | Admitting: Advanced Practice Midwife

## 2022-02-18 DIAGNOSIS — Z3480 Encounter for supervision of other normal pregnancy, unspecified trimester: Secondary | ICD-10-CM

## 2022-02-19 ENCOUNTER — Telehealth: Payer: Self-pay | Admitting: Advanced Practice Midwife

## 2022-02-19 ENCOUNTER — Encounter: Payer: Self-pay | Admitting: Advanced Practice Midwife

## 2022-02-19 DIAGNOSIS — R772 Abnormality of alphafetoprotein: Secondary | ICD-10-CM | POA: Insufficient documentation

## 2022-02-19 NOTE — Telephone Encounter (Signed)
Patient reached at home. Identity confirmed x 2. Reviewed AFP positive result. Discussed with patient that AFP can be redrawn but typically doesn't add fidelity to clinical picture. Patient agrees with my recommendation to stay attend anatomy scan, currently scheduled for 03/04/22. That analysis will assist with confirming spinal issues. Discussed screening vs testing, risk vs confirmatory diagnosis. Patient verbalizes she feels much better. Denies questions at end of call.  Mallie Snooks, MSA, MSN, CNM Certified Nurse Midwife, [place] for [employer], [place] 02/19/22 8:04 AM

## 2022-02-22 ENCOUNTER — Institutional Professional Consult (permissible substitution): Payer: Medicaid Other | Admitting: Physician Assistant

## 2022-02-23 ENCOUNTER — Inpatient Hospital Stay (HOSPITAL_COMMUNITY)
Admission: AD | Admit: 2022-02-23 | Discharge: 2022-02-23 | Disposition: A | Discharge status: Home / Self Care | Payer: Medicaid Other | Attending: Obstetrics and Gynecology | Admitting: Obstetrics and Gynecology

## 2022-02-23 ENCOUNTER — Encounter (HOSPITAL_COMMUNITY): Payer: Self-pay | Admitting: Obstetrics and Gynecology

## 2022-02-23 DIAGNOSIS — O99612 Diseases of the digestive system complicating pregnancy, second trimester: Secondary | ICD-10-CM | POA: Insufficient documentation

## 2022-02-23 DIAGNOSIS — R1031 Right lower quadrant pain: Secondary | ICD-10-CM | POA: Diagnosis not present

## 2022-02-23 DIAGNOSIS — K529 Noninfective gastroenteritis and colitis, unspecified: Secondary | ICD-10-CM | POA: Diagnosis not present

## 2022-02-23 DIAGNOSIS — R8271 Bacteriuria: Secondary | ICD-10-CM | POA: Insufficient documentation

## 2022-02-23 DIAGNOSIS — Z3A18 18 weeks gestation of pregnancy: Secondary | ICD-10-CM | POA: Insufficient documentation

## 2022-02-23 DIAGNOSIS — O26892 Other specified pregnancy related conditions, second trimester: Secondary | ICD-10-CM | POA: Insufficient documentation

## 2022-02-23 DIAGNOSIS — R519 Headache, unspecified: Secondary | ICD-10-CM | POA: Insufficient documentation

## 2022-02-23 LAB — URINALYSIS, ROUTINE W REFLEX MICROSCOPIC
Bilirubin Urine: NEGATIVE
Glucose, UA: NEGATIVE mg/dL
Ketones, ur: NEGATIVE mg/dL
Leukocytes,Ua: NEGATIVE
Nitrite: NEGATIVE
Protein, ur: NEGATIVE mg/dL
Specific Gravity, Urine: 1.005 (ref 1.005–1.030)
pH: 6 (ref 5.0–8.0)

## 2022-02-23 LAB — CBC
HCT: 31.9 % — ABNORMAL LOW (ref 36.0–46.0)
Hemoglobin: 11 g/dL — ABNORMAL LOW (ref 12.0–15.0)
MCH: 31.4 pg (ref 26.0–34.0)
MCHC: 34.5 g/dL (ref 30.0–36.0)
MCV: 91.1 fL (ref 80.0–100.0)
Platelets: 235 10*3/uL (ref 150–400)
RBC: 3.5 MIL/uL — ABNORMAL LOW (ref 3.87–5.11)
RDW: 12.6 % (ref 11.5–15.5)
WBC: 5.8 10*3/uL (ref 4.0–10.5)
nRBC: 0 % (ref 0.0–0.2)

## 2022-02-23 LAB — COMPREHENSIVE METABOLIC PANEL
ALT: 25 U/L (ref 0–44)
AST: 27 U/L (ref 15–41)
Albumin: 3.2 g/dL — ABNORMAL LOW (ref 3.5–5.0)
Alkaline Phosphatase: 44 U/L (ref 38–126)
Anion gap: 8 (ref 5–15)
BUN: 6 mg/dL (ref 6–20)
CO2: 21 mmol/L — ABNORMAL LOW (ref 22–32)
Calcium: 8.7 mg/dL — ABNORMAL LOW (ref 8.9–10.3)
Chloride: 106 mmol/L (ref 98–111)
Creatinine, Ser: 0.48 mg/dL (ref 0.44–1.00)
GFR, Estimated: >60 mL/min (ref >60)
Glucose, Bld: 90 mg/dL (ref 70–99)
Potassium: 3.5 mmol/L (ref 3.5–5.1)
Sodium: 135 mmol/L (ref 135–145)
Total Bilirubin: 0.5 mg/dL (ref 0.3–1.2)
Total Protein: 6.1 g/dL — ABNORMAL LOW (ref 6.5–8.1)

## 2022-02-23 MED ORDER — HYOSCYAMINE SULFATE SL 0.125 MG SL SUBL
1.0000 | SUBLINGUAL_TABLET | Freq: Three times a day (TID) | SUBLINGUAL | 0 refills | Status: DC
Start: 2022-02-23 — End: 2022-03-13

## 2022-02-23 MED ORDER — HYOSCYAMINE SULFATE 0.125 MG SL SUBL
0.1250 mg | SUBLINGUAL_TABLET | Freq: Once | SUBLINGUAL | Status: AC
Start: 2022-02-23 — End: 2022-02-23
  Administered 2022-02-23: 0.125 mg via SUBLINGUAL
  Filled 2022-02-23: qty 1

## 2022-02-23 MED ORDER — DIPHENOXYLATE-ATROPINE 2.5-0.025 MG PO TABS
1.0000 | ORAL_TABLET | Freq: Once | ORAL | Status: AC
Start: 2022-02-23 — End: 2022-02-23
  Administered 2022-02-23: 1 via ORAL
  Filled 2022-02-23: qty 1

## 2022-02-23 MED ORDER — LOPERAMIDE HCL 2 MG PO CAPS: 2.0000 mg | ORAL_CAPSULE | Freq: Four times a day (QID) | ORAL | 0 refills | Status: DC | PRN

## 2022-02-23 NOTE — MAU Provider Note (Cosign Needed Addendum)
Patient Traci King is a 31 y.o. [phone]  At [redacted]w[redacted]d here with complaints of diarrhea and nausea that started yesterday morning at 4 am. She denies VB, contractions, dysuria, LOF, fever, chest pain, SOB. She endorses an occasional HA. She states that she started having diarrhea yesterday morning at 4 am, ate breakfast and then went to work.  Her breakfast consisted of bacon, egg and cheese mcGriddle yesterday morning. She had carrots with ranch for lunch. She then had Pb and J this morning. The diarrhea continued after work and she has had 4 episodes of watery diarrhea this today. She also feels a sharp pain on her right side, near her hip.  History     CSN: [id number]  Arrival date and time: 02/23/22 1304   Event Date/Time   First Provider Initiated Contact with Patient 02/23/22 1419      Chief Complaint  Patient presents with   Nausea   Diarrhea   Abdominal Pain   Diarrhea  This is a new problem. The current episode started yesterday. The problem occurs 5 to 10 times per day. The problem has been unchanged. The stool consistency is described as Watery. Associated symptoms include abdominal pain and headaches. Pertinent negatives include no chills, fever or vomiting. There are no known risk factors. She has tried nothing for the symptoms.  Abdominal Pain This is a new problem. The current episode started today. The problem occurs constantly. The problem has been unchanged. The pain is located in the RLQ. Associated symptoms include diarrhea and headaches. Pertinent negatives include no fever or vomiting.    OB History     Gravida  3   Para  1   Term  0   Preterm  1   AB  1   Living  1      SAB  0   IAB  1   Ectopic  0   Multiple  0   Live Births  1           Past Medical History:  Diagnosis Date   Abnormal Pap smear    Herpes simplex type 1 infection    History of stomach ulcers    Medical history non-contributory     Past Surgical History:   Procedure Laterality Date   NO PAST SURGERIES      Family History  Problem Relation Age of Onset   Hypertension Mother    Diabetes Mother    Hypertension Maternal Grandmother    Diabetes Maternal Grandfather     Social History   Tobacco Use   Smoking status: Former    Packs/day: 0.25    Types: Cigarettes    Quit date: 04/09/2011    Years since quitting: 10.8    Passive exposure: Current   Smokeless tobacco: Never  Vaping Use   Vaping Use: Never used  Substance Use Topics   Alcohol use: No   Drug use: Not Currently    Types: Marijuana    Comment: daily    Allergies: No Known Allergies  Medications Prior to Admission  Medication Sig Dispense Refill Last Dose   metoCLOPramide (REGLAN) 10 MG tablet Take 1 tablet (10 mg total) by mouth 3 (three) times daily with meals. (Patient not taking: Reported on 02/14/2022) 30 tablet 0    ondansetron (ZOFRAN) 4 MG tablet Take 1 tablet (4 mg total) by mouth every 8 (eight) hours as needed for nausea or vomiting. (Patient not taking: Reported on 02/14/2022) 30 tablet 0  Prenatal 28-0.8 MG TABS Take 1 tablet by mouth daily. 30 tablet 12    Prenatal Vit-Fe Fumarate-FA (PRENATAL MULTIVITAMIN) TABS tablet Take 1 tablet by mouth daily at 12 noon.      Prenatal Vit-Fe Phos-FA-Omega (VITAFOL GUMMIES) 3.33-0.333-34.8 MG CHEW Chew 1 tablet by mouth daily. 90 tablet 12     Review of Systems  Constitutional: Negative.  Negative for chills and fever.  HENT: Negative.    Respiratory: Negative.    Cardiovascular: Negative.   Gastrointestinal:  Positive for abdominal pain and diarrhea. Negative for vomiting.  Genitourinary: Negative.   Neurological:  Positive for headaches.  Hematological: Negative.    Physical Exam   Blood pressure 112/75, pulse 79, temperature 98.4 F (36.9 C), temperature source Oral, resp. rate 17, height 5\' 3"  (1.6 m), weight 56.9 kg, last menstrual period 10/01/2021, SpO2 100 %.  Physical Exam Constitutional:       Appearance: She is well-developed.  Pulmonary:     Effort: Pulmonary effort is normal.  Abdominal:     General: Bowel sounds are normal.     Palpations: Abdomen is soft.     Tenderness: There is no abdominal tenderness. There is no guarding or rebound. Negative signs include Murphy's sign.  Skin:    General: Skin is warm and dry.  Neurological:     Mental Status: She is alert.     MAU Course  Procedures  MDM -UA is normal , CBC and CMP are normal Will try levsin and lomotil> patient feels better and has had PO hydration; no episodes of diarrhea while in MAU.  Patient Vitals for the past 24 hrs:  BP Temp Temp src Pulse Resp SpO2 Height Weight  02/23/22 1322 112/75 98.4 F (36.9 C) Oral 79 17 100 % 5\' 3"  (1.6 m) 56.9 kg   Patient's right sided pain most likely due to GI distress; her abdomen is non-tender,  she has no fever and no elevated WBC so low index of suspicion for surgical abdomen  Assessment and Plan   1. [redacted] weeks gestation of pregnancy   2. GBS bacteriuria   3. Gastroenteritis     -RX given for immodium and Levsin -she desires discharge, she feels  well and wants to go home with medications to take at home -patient given handwashing and infection precautions, as well as when to return to MAU -keep upcoming appt next week  02/25/22 Traci King 02/23/2022, 3:19 PM

## 2022-02-23 NOTE — MAU Note (Signed)
Patient signed printed AVS. Placed in patient's chart.

## 2022-02-23 NOTE — MAU Note (Signed)
...  Traci King is a 31 y.o. at [redacted]w[redacted]d here in MAU reporting: Yesterday around 1600 she began experiencing watery diarrhea and right sided abdominal pain that is sharp and intermittent. She reports this pain is worse with walking and movement. She reports she has had a total of 6 episodes of diarrhea. Last episode at 0600 this morning. She reports she feels as if this all started after eating carrots with ranch.   500 mg of Tylenol this morning at 1100 for the abdominal pain.  Bacon, egg, and cheese mcgriddle and carrots with ranch yesterday. PB&J this morning. Has been drinking water and juice.  Onset of complaint: Yesterday at 1600 Pain score: 8/10 right sided abdominal pain  FHT: 145 doppler Lab orders placed from triage: UA

## 2022-02-27 ENCOUNTER — Other Ambulatory Visit: Payer: Medicaid Other

## 2022-03-04 ENCOUNTER — Ambulatory Visit: Payer: Medicaid Other | Admitting: *Deleted

## 2022-03-04 ENCOUNTER — Encounter: Payer: Self-pay | Admitting: *Deleted

## 2022-03-04 ENCOUNTER — Other Ambulatory Visit: Payer: Self-pay | Admitting: *Deleted

## 2022-03-04 ENCOUNTER — Ambulatory Visit: Payer: Medicaid Other | Attending: Obstetrics and Gynecology | Admitting: Obstetrics and Gynecology

## 2022-03-04 ENCOUNTER — Ambulatory Visit: Payer: Medicaid Other | Attending: Obstetrics and Gynecology

## 2022-03-04 VITALS — BP 131/76 | HR 99

## 2022-03-04 DIAGNOSIS — Z3480 Encounter for supervision of other normal pregnancy, unspecified trimester: Secondary | ICD-10-CM | POA: Insufficient documentation

## 2022-03-04 DIAGNOSIS — Z3A19 19 weeks gestation of pregnancy: Secondary | ICD-10-CM | POA: Diagnosis not present

## 2022-03-04 DIAGNOSIS — Z363 Encounter for antenatal screening for malformations: Secondary | ICD-10-CM | POA: Diagnosis not present

## 2022-03-04 DIAGNOSIS — R8271 Bacteriuria: Secondary | ICD-10-CM

## 2022-03-04 DIAGNOSIS — O35BXX0 Maternal care for other (suspected) fetal abnormality and damage, fetal cardiac anomalies, not applicable or unspecified: Secondary | ICD-10-CM | POA: Diagnosis not present

## 2022-03-04 DIAGNOSIS — O09292 Supervision of pregnancy with other poor reproductive or obstetric history, second trimester: Secondary | ICD-10-CM | POA: Insufficient documentation

## 2022-03-04 DIAGNOSIS — O34592 Maternal care for other abnormalities of gravid uterus, second trimester: Secondary | ICD-10-CM | POA: Diagnosis not present

## 2022-03-04 DIAGNOSIS — N856 Intrauterine synechiae: Secondary | ICD-10-CM | POA: Diagnosis not present

## 2022-03-04 DIAGNOSIS — O09299 Supervision of pregnancy with other poor reproductive or obstetric history, unspecified trimester: Secondary | ICD-10-CM | POA: Diagnosis not present

## 2022-03-04 DIAGNOSIS — O289 Unspecified abnormal findings on antenatal screening of mother: Secondary | ICD-10-CM | POA: Diagnosis not present

## 2022-03-04 DIAGNOSIS — R772 Abnormality of alphafetoprotein: Secondary | ICD-10-CM

## 2022-03-04 DIAGNOSIS — O283 Abnormal ultrasonic finding on antenatal screening of mother: Secondary | ICD-10-CM | POA: Diagnosis not present

## 2022-03-04 DIAGNOSIS — O281 Abnormal biochemical finding on antenatal screening of mother: Secondary | ICD-10-CM

## 2022-03-04 DIAGNOSIS — O09212 Supervision of pregnancy with history of pre-term labor, second trimester: Secondary | ICD-10-CM | POA: Diagnosis not present

## 2022-03-04 DIAGNOSIS — O209 Hemorrhage in early pregnancy, unspecified: Secondary | ICD-10-CM | POA: Insufficient documentation

## 2022-03-04 NOTE — Progress Notes (Signed)
Maternal-Fetal Medicine   Name: Traci King DOB: [birth year] MRN: [id number] Referring Provider: Mallie Snooks, CNM  I had the pleasure of seeing [name] today at the [facility]. She is G3 P1011 at 19w 3d gestation and is here for fetal anatomy scan. Her pregnancy is well dated by 6-week ultrasound.  On cell-free fetal DNA screening, the risks of fetal aneuploidies are not increased.  On MSAFP screening, the risk for open neural tube defect was 1 and 315 (not increased) and MSAFP was at 2.83 MoM (increased). Obstetrical history significant for a preterm delivery at [redacted] weeks gestation of a female infant weighing 5 pounds and 6 ounces at birth.  Her pregnancy was, reportedly, complicated by fetal growth restriction.  Ultrasound We performed fetal anatomical survey.  Amniotic fluid is normal and good fetal activity seen.  Fetal biometry is consistent with the previously established dates.  An echogenic intracardiac focus is seen.  No other markers of aneuploidies or fetal structural defects are seen.  Fetal spine and intracranial structures appear normal.  Abdominal wall cord insertion appears normal. 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  Increased risk for open-neural tube defects and increased AFP I reassured the patient of normal fetal anatomy on ultrasound including spine and intracranial structures. I informed her that ultrasound can detect up to 95 out of 100 cases of spina bifida and that amniocentesis will only marginally improve the detection rate.  Increased AFP can also be associated with fetal growth restriction (placental insufficiency), preterm delivery and stillbirth (very rare). I recommended serial fetal growth assessments. It can also follow vaginal bleeding.   I recommended genetic counseling. Patient opted to meet with our genetic counselor at her next visit.  Echogenic intracardiac focus I reassured the patient that echogenic intracardiac focus, a soft  marker for Down syndrome, is considered a normal variant given that she had low risk for Down syndrome on cell-free fetal DNA screening.  I reassured her that it is not associated with structural heart malformations.  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 More common in patients with history of infertility and in those who underwent uterine curettage procedures. -It is associated with good fetal outcomes in most cases. Synechia does not impair free movements of the fetus. -Rarely, it can be associated with fetal growth restriction, preterm delivery or retained placenta. If malpresentation occurs because of synechia, it can lead to cesarean delivery. -I reassured the patient that in most cases, it is not associated with adverse outcomes. It tends to break during labor. Recommendations -An appointment was made for her to return in 4 weeks for fetal growth assessment and genetic counseling. -Serial fetal growth assessments every 4 weeks till delivery.   Thank you for consultation.  If you have any questions or concerns, please contact me the [facility].  Consultation including face-to-face (more than 50%) counseling 30 minutes.

## 2022-03-11 ENCOUNTER — Ambulatory Visit
Admission: RE | Admit: 2022-03-11 | Discharge: 2022-03-11 | Disposition: A | Discharge status: Home / Self Care | Payer: Medicaid Other | Source: Ambulatory Visit | Attending: Obstetrics and Gynecology | Admitting: Obstetrics and Gynecology

## 2022-03-11 DIAGNOSIS — M7989 Other specified soft tissue disorders: Secondary | ICD-10-CM

## 2022-03-11 DIAGNOSIS — N6311 Unspecified lump in the right breast, upper outer quadrant: Secondary | ICD-10-CM

## 2022-03-13 ENCOUNTER — Encounter: Payer: Self-pay | Admitting: Family Medicine

## 2022-03-13 ENCOUNTER — Ambulatory Visit (INDEPENDENT_AMBULATORY_CARE_PROVIDER_SITE_OTHER): Payer: Medicaid Other | Admitting: Family Medicine

## 2022-03-13 VITALS — BP 116/77 | HR 103 | Wt 130.0 lb

## 2022-03-13 DIAGNOSIS — R772 Abnormality of alphafetoprotein: Secondary | ICD-10-CM

## 2022-03-13 DIAGNOSIS — Z3A20 20 weeks gestation of pregnancy: Secondary | ICD-10-CM

## 2022-03-13 DIAGNOSIS — Z3482 Encounter for supervision of other normal pregnancy, second trimester: Secondary | ICD-10-CM

## 2022-03-13 DIAGNOSIS — O09892 Supervision of other high risk pregnancies, second trimester: Secondary | ICD-10-CM

## 2022-03-13 DIAGNOSIS — R8271 Bacteriuria: Secondary | ICD-10-CM

## 2022-03-13 DIAGNOSIS — Z3480 Encounter for supervision of other normal pregnancy, unspecified trimester: Secondary | ICD-10-CM

## 2022-03-13 DIAGNOSIS — O09899 Supervision of other high risk pregnancies, unspecified trimester: Secondary | ICD-10-CM

## 2022-03-13 NOTE — Progress Notes (Signed)
   PRENATAL VISIT NOTE  Subjective:  Traci King is a 31 y.o. [phone] at [redacted]w[redacted]d being seen today for ongoing prenatal care.  She is currently monitored for the following issues for this low-risk pregnancy and has GBS bacteriuria; Encounter for supervision of normal pregnancy, antepartum; Marijuana abuse; Nausea and vomiting during pregnancy prior to [redacted] weeks gestation; History of herpes genitalis; History of preterm delivery, currently pregnant; Cannabinoid hyperemesis syndrome; and Abnormal alpha fetoprotein (AFP) level on their problem list.  Patient reports no complaints.  Contractions: Not present. Vag. Bleeding: None.  Movement: Present. Denies leaking of fluid.   The following portions of the patient's history were reviewed and updated as appropriate: allergies, current medications, past family history, past medical history, past social history, past surgical history and problem list.   Objective:   Vitals:   03/13/22 0833  BP: 116/77  Pulse: (!) 103  Weight: 130 lb (59 kg)    Fetal Status: Fetal Heart Rate (bpm): 148   Movement: Present     General:  Alert, oriented and cooperative. Patient is in no acute distress.  Skin: Skin is warm and dry. No rash noted.   Cardiovascular: Normal heart rate noted  Respiratory: Normal respiratory effort, no problems with respiration noted  Abdomen: Soft, gravid, appropriate for gestational age.  Pain/Pressure: Absent     Pelvic: Cervical exam deferred        Extremities: Normal range of motion.  Edema: None  Mental Status: Normal mood and affect. Normal behavior. Normal judgment and thought content.   Assessment and Plan:  Pregnancy: [id number] at [redacted]w[redacted]d 1. Supervision of other normal pregnancy, antepartum Considering flu F/u anatomy u/s 11/23  2. History of preterm delivery, currently pregnant Less stress this pregnancy  3. GBS bacteriuria Will need treatment in labor  4. Abnormal alpha fetoprotein (AFP) level Serial u/s for  growth 2.83 MOM  Preterm labor symptoms and general obstetric precautions including but not limited to vaginal bleeding, contractions, leaking of fluid and fetal movement were reviewed in detail with the patient. Please refer to After Visit Summary for other counseling recommendations.   Return in 4 weeks (on 04/10/2022).  Future Appointments  Date Time Provider [place]  04/04/2022  9:30 AM [facility] NURSE [facility]  04/04/2022  9:45 AM [facility] US4 [facility]  04/04/2022 10:30 AM [facility] GENETIC COUNSELING RM [facility]  04/10/2022  8:35 AM Donnamae Jude, MD [facility]  05/08/2022  8:15 AM [facility] LAB [facility]  05/08/2022  8:35 AM Donnamae Jude, MD [facility]  05/23/2022  8:35 AM Darlina Rumpf, CNM [facility]    Donnamae Jude, MD

## 2022-03-13 NOTE — Progress Notes (Signed)
ROB [redacted]w[redacted]d  Flu Vaccine Offered. Pt would like to discuss.

## 2022-03-26 ENCOUNTER — Other Ambulatory Visit: Payer: Medicaid Other

## 2022-04-04 ENCOUNTER — Ambulatory Visit: Payer: Medicaid Other

## 2022-04-04 ENCOUNTER — Ambulatory Visit: Payer: Medicaid Other | Admitting: *Deleted

## 2022-04-04 ENCOUNTER — Other Ambulatory Visit: Payer: Self-pay | Admitting: *Deleted

## 2022-04-04 ENCOUNTER — Ambulatory Visit: Payer: Medicaid Other | Attending: Obstetrics and Gynecology

## 2022-04-04 VITALS — BP 119/68 | HR 85

## 2022-04-04 DIAGNOSIS — Z3689 Encounter for other specified antenatal screening: Secondary | ICD-10-CM | POA: Diagnosis present

## 2022-04-04 DIAGNOSIS — O283 Abnormal ultrasonic finding on antenatal screening of mother: Secondary | ICD-10-CM

## 2022-04-04 DIAGNOSIS — O09899 Supervision of other high risk pregnancies, unspecified trimester: Secondary | ICD-10-CM

## 2022-04-04 DIAGNOSIS — R772 Abnormality of alphafetoprotein: Secondary | ICD-10-CM | POA: Insufficient documentation

## 2022-04-04 DIAGNOSIS — O358XX0 Maternal care for other (suspected) fetal abnormality and damage, not applicable or unspecified: Secondary | ICD-10-CM

## 2022-04-04 DIAGNOSIS — N856 Intrauterine synechiae: Secondary | ICD-10-CM

## 2022-04-04 DIAGNOSIS — Z315 Encounter for genetic counseling: Secondary | ICD-10-CM | POA: Insufficient documentation

## 2022-04-04 DIAGNOSIS — O34592 Maternal care for other abnormalities of gravid uterus, second trimester: Secondary | ICD-10-CM

## 2022-04-04 DIAGNOSIS — Z363 Encounter for antenatal screening for malformations: Secondary | ICD-10-CM

## 2022-04-04 DIAGNOSIS — O289 Unspecified abnormal findings on antenatal screening of mother: Secondary | ICD-10-CM

## 2022-04-04 DIAGNOSIS — O09292 Supervision of pregnancy with other poor reproductive or obstetric history, second trimester: Secondary | ICD-10-CM | POA: Diagnosis not present

## 2022-04-04 DIAGNOSIS — Z8759 Personal history of other complications of pregnancy, childbirth and the puerperium: Secondary | ICD-10-CM

## 2022-04-04 DIAGNOSIS — O09212 Supervision of pregnancy with history of pre-term labor, second trimester: Secondary | ICD-10-CM | POA: Diagnosis not present

## 2022-04-04 DIAGNOSIS — Z3A23 23 weeks gestation of pregnancy: Secondary | ICD-10-CM

## 2022-04-04 NOTE — Progress Notes (Signed)
Traci King was scheduled for genetic counseling after her ultrasound appointment today (04/04/2022) to discuss her abnormal maternal serum AFP result. Ms. Gendron communicated to Dr. Parke Poisson that she would like to decline genetic counseling today. Genetic counseling remains available to Ms. Makepeace in the future should she desire this service.

## 2022-04-10 ENCOUNTER — Ambulatory Visit (INDEPENDENT_AMBULATORY_CARE_PROVIDER_SITE_OTHER): Payer: Medicaid Other | Admitting: Family Medicine

## 2022-04-10 VITALS — BP 108/72 | HR 82 | Wt 135.0 lb

## 2022-04-10 DIAGNOSIS — R772 Abnormality of alphafetoprotein: Secondary | ICD-10-CM

## 2022-04-10 DIAGNOSIS — Z3482 Encounter for supervision of other normal pregnancy, second trimester: Secondary | ICD-10-CM

## 2022-04-10 DIAGNOSIS — Z3480 Encounter for supervision of other normal pregnancy, unspecified trimester: Secondary | ICD-10-CM

## 2022-04-10 DIAGNOSIS — Z3A24 24 weeks gestation of pregnancy: Secondary | ICD-10-CM

## 2022-04-10 DIAGNOSIS — N898 Other specified noninflammatory disorders of vagina: Secondary | ICD-10-CM

## 2022-04-10 DIAGNOSIS — O09899 Supervision of other high risk pregnancies, unspecified trimester: Secondary | ICD-10-CM

## 2022-04-10 DIAGNOSIS — R8271 Bacteriuria: Secondary | ICD-10-CM

## 2022-04-10 DIAGNOSIS — O09892 Supervision of other high risk pregnancies, second trimester: Secondary | ICD-10-CM

## 2022-04-10 NOTE — Progress Notes (Signed)
ROB [redacted]w[redacted]d  Vaginal irritation on the outside. Would like exam. Flu Vaccine Declined.

## 2022-04-10 NOTE — Progress Notes (Signed)
   PRENATAL VISIT NOTE  Subjective:  Traci King is a 31 y.o. [phone] at [redacted]w[redacted]d being seen today for ongoing prenatal care.  She is currently monitored for the following issues for this low-risk pregnancy and has GBS bacteriuria; Encounter for supervision of normal pregnancy, antepartum; Marijuana abuse; Nausea and vomiting during pregnancy prior to [redacted] weeks gestation; History of herpes genitalis; History of preterm delivery, currently pregnant; Cannabinoid hyperemesis syndrome; and Abnormal alpha fetoprotein (AFP) level on their problem list.  Patient reports vaginal irritation worse over last 3 days, and slowly improving. 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 No outbreaks since then.  Contractions: Not present. Vag. Bleeding: None.  Movement: Present. Denies leaking of fluid.   The following portions of the patient's history were reviewed and updated as appropriate: allergies, current medications, past family history, past medical history, past social history, past surgical history and problem list.   Objective:   Vitals:   04/10/22 0849  BP: 108/72  Pulse: 82  Weight: 135 lb (61.2 kg)    Fetal Status: Fetal Heart Rate (bpm): 140 Fundal Height: 23 cm Movement: Present     General:  Alert, oriented and cooperative. Patient is in no acute distress.  Skin: Skin is warm and dry. No rash noted.   Cardiovascular: Normal heart rate noted  Respiratory: Normal respiratory effort, no problems with respiration noted  Abdomen: Soft, gravid, appropriate for gestational age.  Pain/Pressure: Present     Pelvic: Cervical exam performed in the presence of a chaperone small ulceration on left labia minora        Extremities: Normal range of motion.  Edema: None  Mental Status: Normal mood and affect. Normal behavior. Normal judgment and thought content.   Assessment and Plan:  Pregnancy: [id number] at [redacted]w[redacted]d 1. Supervision of other normal pregnancy, antepartum Continue routine prenatal  care.  2. Abnormal alpha fetoprotein (AFP) level Serial u/s for growth  3. GBS bacteriuria Will need treatment in labor  4. History of preterm delivery, currently pregnant At 49 6/7 wks PTL precautions reviewed  5. Vaginal irritation Small ulcer and h/o HSV1--will check culture and consider beginning ppx. - Herpes simplex virus culture  Preterm labor symptoms and general obstetric precautions including but not limited to vaginal bleeding, contractions, leaking of fluid and fetal movement were reviewed in detail with the patient. Please refer to After Visit Summary for other counseling recommendations.   Return in 4 weeks (on 05/08/2022) for 28 wk labs, [facility].  Future Appointments  Date Time Provider Department Center  05/02/2022  7:30 AM [facility] NURSE [facility]  05/02/2022  7:45 AM [facility] US5 [facility]  05/08/2022  8:15 AM [facility] LAB [facility]  05/08/2022  8:35 AM Reva Bores, MD [facility]  05/23/2022  8:35 AM Calvert Cantor, CNM [facility]  06/06/2022  8:35 AM Calvert Cantor, CNM [facility]  06/20/2022  8:35 AM Calvert Cantor, CNM [facility]    Reva Bores, MD

## 2022-04-12 LAB — HERPES SIMPLEX VIRUS CULTURE

## 2022-05-02 ENCOUNTER — Encounter: Payer: Self-pay | Admitting: Obstetrics and Gynecology

## 2022-05-02 ENCOUNTER — Ambulatory Visit: Payer: Medicaid Other | Admitting: *Deleted

## 2022-05-02 ENCOUNTER — Other Ambulatory Visit: Payer: Self-pay | Admitting: *Deleted

## 2022-05-02 ENCOUNTER — Ambulatory Visit: Payer: Medicaid Other | Attending: Obstetrics

## 2022-05-02 VITALS — BP 122/70 | HR 79

## 2022-05-02 DIAGNOSIS — O283 Abnormal ultrasonic finding on antenatal screening of mother: Secondary | ICD-10-CM | POA: Insufficient documentation

## 2022-05-02 DIAGNOSIS — O289 Unspecified abnormal findings on antenatal screening of mother: Secondary | ICD-10-CM

## 2022-05-02 DIAGNOSIS — O358XX0 Maternal care for other (suspected) fetal abnormality and damage, not applicable or unspecified: Secondary | ICD-10-CM

## 2022-05-02 DIAGNOSIS — Z8759 Personal history of other complications of pregnancy, childbirth and the puerperium: Secondary | ICD-10-CM | POA: Diagnosis present

## 2022-05-02 DIAGNOSIS — Z3689 Encounter for other specified antenatal screening: Secondary | ICD-10-CM | POA: Insufficient documentation

## 2022-05-02 DIAGNOSIS — N856 Intrauterine synechiae: Secondary | ICD-10-CM | POA: Diagnosis present

## 2022-05-02 DIAGNOSIS — R772 Abnormality of alphafetoprotein: Secondary | ICD-10-CM | POA: Diagnosis present

## 2022-05-02 DIAGNOSIS — O09899 Supervision of other high risk pregnancies, unspecified trimester: Secondary | ICD-10-CM | POA: Insufficient documentation

## 2022-05-02 DIAGNOSIS — O09292 Supervision of pregnancy with other poor reproductive or obstetric history, second trimester: Secondary | ICD-10-CM | POA: Diagnosis not present

## 2022-05-02 DIAGNOSIS — O34592 Maternal care for other abnormalities of gravid uterus, second trimester: Secondary | ICD-10-CM

## 2022-05-02 DIAGNOSIS — O09212 Supervision of pregnancy with history of pre-term labor, second trimester: Secondary | ICD-10-CM | POA: Diagnosis not present

## 2022-05-02 DIAGNOSIS — Z3A27 27 weeks gestation of pregnancy: Secondary | ICD-10-CM

## 2022-05-08 ENCOUNTER — Ambulatory Visit: Payer: Medicaid Other

## 2022-05-08 ENCOUNTER — Ambulatory Visit (INDEPENDENT_AMBULATORY_CARE_PROVIDER_SITE_OTHER): Payer: Medicaid Other | Admitting: Family Medicine

## 2022-05-08 ENCOUNTER — Encounter: Payer: Self-pay | Admitting: Family Medicine

## 2022-05-08 VITALS — BP 144/90 | HR 78 | Wt 136.0 lb

## 2022-05-08 DIAGNOSIS — Z8619 Personal history of other infectious and parasitic diseases: Secondary | ICD-10-CM

## 2022-05-08 DIAGNOSIS — R8271 Bacteriuria: Secondary | ICD-10-CM

## 2022-05-08 DIAGNOSIS — Z3A28 28 weeks gestation of pregnancy: Secondary | ICD-10-CM

## 2022-05-08 DIAGNOSIS — O09899 Supervision of other high risk pregnancies, unspecified trimester: Secondary | ICD-10-CM

## 2022-05-08 DIAGNOSIS — Z3480 Encounter for supervision of other normal pregnancy, unspecified trimester: Secondary | ICD-10-CM

## 2022-05-08 DIAGNOSIS — O163 Unspecified maternal hypertension, third trimester: Secondary | ICD-10-CM

## 2022-05-08 DIAGNOSIS — R772 Abnormality of alphafetoprotein: Secondary | ICD-10-CM

## 2022-05-08 NOTE — Progress Notes (Signed)
   PRENATAL VISIT NOTE  Subjective:  Traci King is a 31 y.o. [phone] at [redacted]w[redacted]d being seen today for ongoing prenatal care.  She is currently monitored for the following issues for this high-risk pregnancy and has GBS bacteriuria; Encounter for supervision of normal pregnancy, antepartum; Marijuana abuse; Nausea and vomiting during pregnancy prior to [redacted] weeks gestation; History of herpes genitalis; History of preterm delivery, currently pregnant; Cannabinoid hyperemesis syndrome; and Abnormal alpha fetoprotein (AFP) level on their problem list.  Patient reports no complaints.  Contractions: Not present. Vag. Bleeding: None.  Movement: Present. Denies leaking of fluid.   The following portions of the patient's history were reviewed and updated as appropriate: allergies, current medications, past family history, past medical history, past social history, past surgical history and problem list.   Objective:   Vitals:   05/08/22 0828 05/08/22 0833 05/08/22 0921  BP: (!) 163/94 (!) 156/98 (!) 144/90  Pulse: 73  78  Weight: 136 lb (61.7 kg)      Fetal Status: Fetal Heart Rate (bpm): 135 Fundal Height: 27 cm Movement: Present     General:  Alert, oriented and cooperative. Patient is in no acute distress.  Skin: Skin is warm and dry. No rash noted.   Cardiovascular: Normal heart rate noted  Respiratory: Normal respiratory effort, no problems with respiration noted  Abdomen: Soft, gravid, appropriate for gestational age.  Pain/Pressure: Absent     Pelvic: Cervical exam deferred        Extremities: Normal range of motion.  Edema: None  Mental Status: Normal mood and affect. Normal behavior. Normal judgment and thought content.   Assessment and Plan:  Pregnancy: [id number] at [redacted]w[redacted]d 1. Supervision of other normal pregnancy, antepartum 28 wk labs today TDaP when returns in 2 days  2. GBS bacteriuria Will need treatment in labor  3. History of herpes genitalis Will need ppx  eventually  4. History of preterm delivery, currently pregnant Last pregnancy 36 6/7 wks  5. Abnormal alpha fetoprotein (AFP) level Last growth @ 20%  6. Elevated blood pressure affecting pregnancy in third trimester, antepartum On several occasions today, h/o GHTN and FH. No elevated BP in this pregnancy. Check labs. Return for BP check in 2 days. If remains elevated, and not in severe range, may need meds--has f/u for growth and last growth less than 1 week ago. If labs are abnl, or BPs are severe range, would need inpt. mgmt - Comprehensive metabolic panel - Protein / creatinine ratio, urine  Preterm labor symptoms and general obstetric precautions including but not limited to vaginal bleeding, contractions, leaking of fluid and fetal movement were reviewed in detail with the patient. Please refer to After Visit Summary for other counseling recommendations.   Return in 2 days (on 05/10/2022) for BP check, 2 weeks for ROB.  Future Appointments  Date Time Provider Department Center  05/10/2022  8:45 AM [facility] NURSE [facility]  05/23/2022  8:35 AM Calvert Cantor, CNM [facility]  06/06/2022  8:35 AM Calvert Cantor, CNM [facility]  06/20/2022  8:35 AM Calvert Cantor, CNM [facility]  06/28/2022  8:15 AM [facility] NURSE [facility]  06/28/2022  8:30 AM [facility] US2 [facility]    Reva Bores, MD

## 2022-05-08 NOTE — Progress Notes (Signed)
BP 144/90 manual

## 2022-05-09 LAB — GLUCOSE TOLERANCE, 2 HOURS W/ 1HR
Glucose, 1 hour: 92 mg/dL (ref 70–179)
Glucose, 2 hour: 121 mg/dL (ref 70–152)
Glucose, Fasting: 63 mg/dL — ABNORMAL LOW (ref 70–91)

## 2022-05-09 LAB — COMPREHENSIVE METABOLIC PANEL
ALT: 12 IU/L (ref 0–32)
AST: 21 IU/L (ref 0–40)
Albumin/Globulin Ratio: 1.5 (ref 1.2–2.2)
Albumin: 3.7 g/dL — ABNORMAL LOW (ref 3.9–4.9)
Alkaline Phosphatase: 96 IU/L (ref 44–121)
BUN/Creatinine Ratio: 8 — ABNORMAL LOW (ref 9–23)
BUN: 4 mg/dL — ABNORMAL LOW (ref 6–20)
Bilirubin Total: 0.5 mg/dL (ref 0.0–1.2)
CO2: 22 mmol/L (ref 20–29)
Calcium: 8.9 mg/dL (ref 8.7–10.2)
Chloride: 103 mmol/L (ref 96–106)
Creatinine, Ser: 0.48 mg/dL — ABNORMAL LOW (ref 0.57–1.00)
Globulin, Total: 2.5 g/dL (ref 1.5–4.5)
Glucose: 100 mg/dL — ABNORMAL HIGH (ref 70–99)
Potassium: 4.1 mmol/L (ref 3.5–5.2)
Sodium: 140 mmol/L (ref 134–144)
Total Protein: 6.2 g/dL (ref 6.0–8.5)
eGFR: 128 mL/min/{1.73_m2} (ref >59)

## 2022-05-09 LAB — HIV ANTIBODY (ROUTINE TESTING W REFLEX): HIV Screen 4th Generation wRfx: NONREACTIVE

## 2022-05-09 LAB — CBC
Hematocrit: 36.6 % (ref 34.0–46.6)
Hemoglobin: 12.3 g/dL (ref 11.1–15.9)
MCH: 31.4 pg (ref 26.6–33.0)
MCHC: 33.6 g/dL (ref 31.5–35.7)
MCV: 93 fL (ref 79–97)
Platelets: 275 10*3/uL (ref 150–450)
RBC: 3.92 x10E6/uL (ref 3.77–5.28)
RDW: 12.3 % (ref 11.7–15.4)
WBC: 7.3 10*3/uL (ref 3.4–10.8)

## 2022-05-09 LAB — PROTEIN / CREATININE RATIO, URINE
Creatinine, Urine: 8 mg/dL
Protein, Ur: 5.8 mg/dL
Protein/Creat Ratio: 725 mg/g creat — ABNORMAL HIGH (ref 0–200)

## 2022-05-09 LAB — RPR: RPR Ser Ql: NONREACTIVE

## 2022-05-10 ENCOUNTER — Ambulatory Visit (INDEPENDENT_AMBULATORY_CARE_PROVIDER_SITE_OTHER): Payer: Medicaid Other

## 2022-05-10 ENCOUNTER — Encounter: Payer: Self-pay | Admitting: Family Medicine

## 2022-05-10 VITALS — BP 145/84 | HR 76

## 2022-05-10 DIAGNOSIS — Z23 Encounter for immunization: Secondary | ICD-10-CM | POA: Diagnosis not present

## 2022-05-10 DIAGNOSIS — O1400 Mild to moderate pre-eclampsia, unspecified trimester: Secondary | ICD-10-CM | POA: Insufficient documentation

## 2022-05-10 DIAGNOSIS — Z3400 Encounter for supervision of normal first pregnancy, unspecified trimester: Secondary | ICD-10-CM

## 2022-05-10 DIAGNOSIS — O1493 Unspecified pre-eclampsia, third trimester: Secondary | ICD-10-CM | POA: Insufficient documentation

## 2022-05-10 NOTE — Progress Notes (Unsigned)
Subjective:  Traci King is a 31 y.o. female here for BP check.   Hypertension ROS: Patient denies any headaches, visual symptoms, RUQ/epigastric pain or other concerning symptoms.  Objective:  BP (!) 145/84   Pulse 76   LMP 10/01/2021   Appearance alert, well appearing, and in no distress. General exam BP noted to be Elevated today in office.    Assessment:   Blood Pressure needs further observation. Recent labs reviewed by provider.  Plan: Has Pre-E--will need weekly visits for labs and BP checks and BPPs starting at 32 weeks per Dr.Pratt.

## 2022-05-10 NOTE — Progress Notes (Deleted)
Subjective:  Traci King is a 31 y.o. female here for BP check.   Hypertension ROS: Patient denies any headaches, visual symptoms, RUQ/epigastric pain or other concerning symptoms.  Objective:  LMP 10/01/2021   Appearance alert, well appearing, and in no distress. General exam BP noted to be Elevated today in office.    Assessment:   Blood Pressure {disease control degree:315147}.   Plan:  ***.

## 2022-05-15 ENCOUNTER — Encounter: Payer: Self-pay | Admitting: Advanced Practice Midwife

## 2022-05-15 ENCOUNTER — Ambulatory Visit (INDEPENDENT_AMBULATORY_CARE_PROVIDER_SITE_OTHER): Payer: Medicaid Other | Admitting: Obstetrics and Gynecology

## 2022-05-15 ENCOUNTER — Encounter: Payer: Self-pay | Admitting: Obstetrics and Gynecology

## 2022-05-15 VITALS — BP 147/92 | HR 91 | Wt 142.0 lb

## 2022-05-15 DIAGNOSIS — O09893 Supervision of other high risk pregnancies, third trimester: Secondary | ICD-10-CM

## 2022-05-15 DIAGNOSIS — R772 Abnormality of alphafetoprotein: Secondary | ICD-10-CM

## 2022-05-15 DIAGNOSIS — N856 Intrauterine synechiae: Secondary | ICD-10-CM | POA: Insufficient documentation

## 2022-05-15 DIAGNOSIS — O1403 Mild to moderate pre-eclampsia, third trimester: Secondary | ICD-10-CM

## 2022-05-15 DIAGNOSIS — R8271 Bacteriuria: Secondary | ICD-10-CM

## 2022-05-15 DIAGNOSIS — Z8619 Personal history of other infectious and parasitic diseases: Secondary | ICD-10-CM

## 2022-05-15 DIAGNOSIS — Z3A29 29 weeks gestation of pregnancy: Secondary | ICD-10-CM

## 2022-05-15 DIAGNOSIS — O09899 Supervision of other high risk pregnancies, unspecified trimester: Secondary | ICD-10-CM

## 2022-05-15 NOTE — Patient Instructions (Signed)
Continue to check your blood pressures twice a day Call the office for blood pressures that are consistently above 155 for the top number or 105 for the bottom number   Hypertension During Pregnancy Hypertension is also called high blood pressure. High blood pressure means that the force of your blood moving in your body is too strong. It can cause problems for you and your baby. Different types of high blood pressure can happen during pregnancy. The types are: High blood pressure before you got pregnant. This is called chronic hypertension.  This can continue during your pregnancy. Your doctor will want to keep checking your blood pressure. You may need medicine to keep your blood pressure under control while you are pregnant. You will need follow-up visits after you have your baby. High blood pressure that goes up during pregnancy when it was normal before. This is called gestational hypertension. It will usually get better after you have your baby, but your doctor will need to watch your blood pressure to make sure that it is getting better. Very high blood pressure during pregnancy. This is called preeclampsia. Very high blood pressure is an emergency that needs to be checked and treated right away. You may develop very high blood pressure after giving birth. This is called postpartum preeclampsia. This usually occurs within 48 hours after childbirth but may occur up to 6 weeks after giving birth. This is rare. How does this affect me? If you have high blood pressure during pregnancy, you have a higher chance of developing high blood pressure: As you get older. If you get pregnant again. In some cases, high blood pressure during pregnancy can cause: Stroke. Heart attack. Damage to the kidneys, lungs, or liver. Preeclampsia. Jerky movements you cannot control (convulsions or seizures). Problems with the placenta.  What can I do to lower my risk?  Keep a healthy weight. Eat a healthy  diet. Follow what your doctor tells you about treating any medical problems that you had before becoming pregnant. It is very important to go to all of your doctor visits. Your doctor will check your blood pressure and make sure that your pregnancy is progressing as it should. Treatment should start early if a problem is found.  Follow these instructions at home:  Take your blood pressure 1-2 times per day. Call the office if your blood pressure is 155 or higher for the top number or 105 or higher for the bottom number.    Eating and drinking  Drink enough fluid to keep your pee (urine) pale yellow. Avoid caffeine. Lifestyle Do not use any products that contain nicotine or tobacco, such as cigarettes, e-cigarettes, and chewing tobacco. If you need help quitting, ask your doctor. Do not use alcohol or drugs. Avoid stress. Rest and get plenty of sleep. Regular exercise can help. Ask your doctor what kinds of exercise are best for you. General instructions Take over-the-counter and prescription medicines only as told by your doctor. Keep all prenatal and follow-up visits as told by your doctor. This is important. Contact a doctor if: You have symptoms that your doctor told you to watch for, such as: Headaches. Nausea. Vomiting. Belly (abdominal) pain. Dizziness. Light-headedness. Get help right away if: You have: Very bad belly pain that does not get better with treatment. A very bad headache that does not get better. Vomiting that does not get better. Sudden, fast weight gain. Sudden swelling in your hands, ankles, or face. Blood in your pee. Blurry vision. Double vision.   Shortness of breath. Chest pain. Weakness on one side of your body. Trouble talking. Summary High blood pressure is also called hypertension. High blood pressure means that the force of your blood moving in your body is too strong. High blood pressure can cause problems for you and your baby. Keep all  follow-up visits as told by your doctor. This is important. This information is not intended to replace advice given to you by your health care provider. Make sure you discuss any questions you have with your health care provider. Document Released: 06/15/2010 Document Revised: 09/03/2018 Document Reviewed: 06/09/2018 Elsevier Patient Education  2020 [employer] Inc[employer]  

## 2022-05-15 NOTE — Progress Notes (Signed)
PRENATAL VISIT NOTE  Subjective:  Traci King is a 31 y.o. [phone] at [redacted]w[redacted]d being seen today for ongoing prenatal care.  She is currently monitored for the following issues for this high-risk pregnancy and has GBS bacteriuria; Supervision of high risk pregnancy in third trimester; Marijuana abuse; Nausea and vomiting during pregnancy prior to [redacted] weeks gestation; History of herpes genitalis; History of preterm delivery, currently pregnant; Cannabinoid hyperemesis syndrome; Abnormal alpha fetoprotein (AFP) level; Mild pre-eclampsia; and Uterine synechiae on their problem list.  Patient reports no complaints.  Contractions: Irritability. Vag. Bleeding: None.  Movement: Present. Denies leaking of fluid.   The following portions of the patient's history were reviewed and updated as appropriate: allergies, current medications, past family history, past medical history, past social history, past surgical history and problem list.   Objective:   Vitals:   05/15/22 0814  BP: (!) 147/92  Pulse: 91  Weight: 142 lb (64.4 kg)    Fetal Status: Fetal Heart Rate (bpm): 137   Movement: Present     General:  Alert, oriented and cooperative. Patient is in no acute distress.  Skin: Skin is warm and dry. No rash noted.   Cardiovascular: Normal heart rate noted  Respiratory: Normal respiratory effort, no problems with respiration noted  Abdomen: Soft, gravid, appropriate for gestational age.  Pain/Pressure: Absent     Pelvic: Cervical exam deferred        Extremities: Normal range of motion.  Edema: None  Mental Status: Normal mood and affect. Normal behavior. Normal judgment and thought content.   Assessment and Plan:  Pregnancy: [id number] at [redacted]w[redacted]d 1. Mild pre-eclampsia in third trimester D/w her re: disease process, delivery goals, ED precautions, etc. Patient to check BPs qday and parameters and advice given.  Needs qwk labs and per guidelines bpp to start; pt not scheduled for this so will set  up for next week when she's also due for surveillance growth. After 32wks, can do BPPs at [facility] office 12/7: 20%, 1062gm, ac 18%, afi wnl, synechiae not seen 11/9: 44%, ac 39% - CBC - Comprehensive metabolic panel - [place] MFM OB FOLLOW UP; Future - [place] MFM FETAL BPP WO NON STRESS; Future  2. [redacted] weeks gestation of pregnancy Pt considering BTL. D/w her re: r/b/a. F/u next week GTT wnl - CBC - Comprehensive metabolic panel - [place] MFM OB FOLLOW UP; Future - [place] MFM FETAL BPP WO NON STRESS; Future  3. Uterine synechiae Not seen at last growth u/s on 12/7  4. History of herpes genitalis Start ppx at 32-35wks  5. GBS bacteriuria Too low to treat before delivery  6. History of preterm delivery, currently pregnant Continue expectant management  7. Abnormal alpha fetoprotein (AFP) level OSB not increased. Continue with current plan of care.   Preterm labor symptoms and general obstetric precautions including but not limited to vaginal bleeding, contractions, leaking of fluid and fetal movement were reviewed in detail with the patient. Please refer to After Visit Summary for other counseling recommendations.   Return in about 1 week (around 05/22/2022) for in person, high risk ob, md visit.  Future Appointments  Date Time Provider [place]  05/22/2022  8:15 AM Donnamae Jude, MD [facility]  05/29/2022  8:15 AM Donnamae Jude, MD [facility]  06/05/2022  8:15 AM [facility] NST [facility]  06/05/2022  9:15 AM Donnamae Jude, MD [facility]  06/12/2022  8:15 AM [facility] NST [facility]  06/12/2022  9:15 AM  Reva Bores, MD [facility]  06/19/2022  8:15 AM [facility] NST [facility]  06/19/2022  9:15 AM Reva Bores, MD [facility]  06/26/2022  8:15 AM [facility] NST [facility]  06/26/2022  9:15 AM Reva Bores, MD [facility]  06/28/2022  8:15 AM [facility] NURSE [facility]  06/28/2022   8:30 AM [facility] US2 [facility]  07/03/2022  8:15 AM [facility] NST [facility]  07/03/2022  9:15 AM Lyman Bing, MD [facility]  07/10/2022  8:15 AM [facility] NST [facility]  07/10/2022  9:15 AM Reva Bores, MD [facility]    [place] Bing, MD

## 2022-05-15 NOTE — Progress Notes (Signed)
ROB [redacted]w[redacted]d   Pt has concerns regarding recent dx of Pre-E.

## 2022-05-16 ENCOUNTER — Other Ambulatory Visit: Payer: Self-pay | Admitting: *Deleted

## 2022-05-16 LAB — CBC
Hematocrit: 36 % (ref 34.0–46.6)
Hemoglobin: 11.9 g/dL (ref 11.1–15.9)
MCH: 31 pg (ref 26.6–33.0)
MCHC: 33.1 g/dL (ref 31.5–35.7)
MCV: 94 fL (ref 79–97)
Platelets: 261 10*3/uL (ref 150–450)
RBC: 3.84 x10E6/uL (ref 3.77–5.28)
RDW: 12.3 % (ref 11.7–15.4)
WBC: 7.5 10*3/uL (ref 3.4–10.8)

## 2022-05-16 LAB — COMPREHENSIVE METABOLIC PANEL
ALT: 13 IU/L (ref 0–32)
AST: 19 IU/L (ref 0–40)
Albumin/Globulin Ratio: 1.6 (ref 1.2–2.2)
Albumin: 3.5 g/dL — ABNORMAL LOW (ref 3.9–4.9)
Alkaline Phosphatase: 93 IU/L (ref 44–121)
BUN/Creatinine Ratio: 14 (ref 9–23)
BUN: 6 mg/dL (ref 6–20)
Bilirubin Total: 0.2 mg/dL (ref 0.0–1.2)
CO2: 23 mmol/L (ref 20–29)
Calcium: 8.6 mg/dL — ABNORMAL LOW (ref 8.7–10.2)
Chloride: 105 mmol/L (ref 96–106)
Creatinine, Ser: 0.43 mg/dL — ABNORMAL LOW (ref 0.57–1.00)
Globulin, Total: 2.2 g/dL (ref 1.5–4.5)
Glucose: 61 mg/dL — ABNORMAL LOW (ref 70–99)
Potassium: 3.5 mmol/L (ref 3.5–5.2)
Sodium: 140 mmol/L (ref 134–144)
Total Protein: 5.7 g/dL — ABNORMAL LOW (ref 6.0–8.5)
eGFR: 132 mL/min/{1.73_m2} (ref >59)

## 2022-05-16 MED ORDER — PRENATAL 28-0.8 MG PO TABS: 1.0000 | ORAL_TABLET | Freq: Every day | ORAL | 12 refills | Status: DC

## 2022-05-17 ENCOUNTER — Ambulatory Visit: Payer: Medicaid Other | Admitting: *Deleted

## 2022-05-17 ENCOUNTER — Ambulatory Visit: Payer: Medicaid Other | Attending: Obstetrics and Gynecology

## 2022-05-17 ENCOUNTER — Other Ambulatory Visit: Payer: Self-pay | Admitting: *Deleted

## 2022-05-17 VITALS — BP 136/79 | HR 81

## 2022-05-17 DIAGNOSIS — O1490 Unspecified pre-eclampsia, unspecified trimester: Secondary | ICD-10-CM

## 2022-05-17 DIAGNOSIS — O1403 Mild to moderate pre-eclampsia, third trimester: Secondary | ICD-10-CM | POA: Insufficient documentation

## 2022-05-17 DIAGNOSIS — R8271 Bacteriuria: Secondary | ICD-10-CM | POA: Diagnosis present

## 2022-05-17 DIAGNOSIS — Z3A29 29 weeks gestation of pregnancy: Secondary | ICD-10-CM | POA: Insufficient documentation

## 2022-05-17 DIAGNOSIS — O09899 Supervision of other high risk pregnancies, unspecified trimester: Secondary | ICD-10-CM

## 2022-05-21 ENCOUNTER — Encounter: Payer: Self-pay | Admitting: Family Medicine

## 2022-05-22 ENCOUNTER — Encounter: Payer: Self-pay | Admitting: Family Medicine

## 2022-05-22 ENCOUNTER — Ambulatory Visit (INDEPENDENT_AMBULATORY_CARE_PROVIDER_SITE_OTHER): Payer: Medicaid Other | Admitting: Family Medicine

## 2022-05-22 VITALS — BP 151/90 | HR 87 | Wt 141.0 lb

## 2022-05-22 DIAGNOSIS — O1403 Mild to moderate pre-eclampsia, third trimester: Secondary | ICD-10-CM

## 2022-05-22 DIAGNOSIS — R0602 Shortness of breath: Secondary | ICD-10-CM

## 2022-05-22 DIAGNOSIS — Z3A30 30 weeks gestation of pregnancy: Secondary | ICD-10-CM

## 2022-05-22 MED ORDER — NIFEDIPINE ER OSMOTIC RELEASE 30 MG PO TB24: 30.0000 mg | ORAL_TABLET | Freq: Every day | ORAL | 1 refills | Status: DC

## 2022-05-22 NOTE — Progress Notes (Signed)
ROB [redacted]w[redacted]d  B/P elevated today.  Repeat 151/90 P : 87 Pt denies any HA's or visual changes.  Patient states B/P reading was elevated yesterday and felt tightness in legs.

## 2022-05-22 NOTE — Patient Instructions (Signed)
Check your blood pressure daily--call if greater than 160/110

## 2022-05-22 NOTE — Progress Notes (Signed)
PRENATAL VISIT NOTE  Subjective:  Traci King is a 31 y.o. [phone] at [redacted]w[redacted]d being seen today for ongoing prenatal care.  She is currently monitored for the following issues for this high-risk pregnancy and has GBS bacteriuria; Supervision of high risk pregnancy in third trimester; Marijuana abuse; Nausea and vomiting during pregnancy prior to [redacted] weeks gestation; History of herpes genitalis; History of preterm delivery, currently pregnant; Cannabinoid hyperemesis syndrome; Abnormal alpha fetoprotein (AFP) level; Mild pre-eclampsia; and Uterine synechiae on their problem list.  Patient reports  shortness of breath with minimal exertion. Denies orthopnea.  Contractions: Not present. Vag. Bleeding: None.  Movement: Present. Denies leaking of fluid.   The following portions of the patient's history were reviewed and updated as appropriate: allergies, current medications, past family history, past medical history, past social history, past surgical history and problem list.   Objective:   Vitals:   05/22/22 0827 05/22/22 0830  BP: (!) 180/99 (!) 151/90  Pulse: 69 87  Weight: 141 lb (64 kg)     Fetal Status: Fetal Heart Rate (bpm): 142 Fundal Height: 27 cm Movement: Present     General:  Alert, oriented and cooperative. Patient is in no acute distress.  Skin: Skin is warm and dry. No rash noted.   Cardiovascular: Normal heart rate noted  Respiratory: Normal respiratory effort, no problems with respiration noted  Abdomen: Soft, gravid, appropriate for gestational age.  Pain/Pressure: Present     Pelvic: Cervical exam deferred        Extremities: Normal range of motion.  Edema: None  Mental Status: Normal mood and affect. Normal behavior. Normal judgment and thought content.   Assessment and Plan:  Pregnancy: [id number] at [redacted]w[redacted]d 1. Mild pre-eclampsia in third trimester BPs are creeping--start procardia, repeat BP in  days. Labs today--will likely need inpatient mgmt before long. Note  given to limit work. Warning signs reviewed. Will need to check BP daily and notify us if persistently > 160/110. Desires BTL--papers signed today - CBC - Comprehensive metabolic panel - Protein / creatinine ratio, urine - NIFEdipine (PROCARDIA XL) 30 MG 24 hr tablet; Take 1 tablet (30 mg total) by mouth daily.  Dispense: 30 tablet; Refill: 1  2. Short of breath on exertion Given degree of SOB, will refer to cardio/OB and check ECHO as pp cardiomyopathy would give elevated BP--if cannot get in, could do inpt echo if still having SOB and needing inpt. Treatment. - AMB Referral to Cardio Obstetrics  Preterm labor symptoms and general obstetric precautions including but not limited to vaginal bleeding, contractions, leaking of fluid and fetal movement were reviewed in detail with the patient. Please refer to After Visit Summary for other counseling recommendations.   Return in 1 week (on 05/29/2022) for [facility], needs MD, OB visit and BPP - BP check in 2 days.  Future Appointments  Date Time Provider [place]  05/24/2022  8:45 AM [facility] NURSE [facility]  05/29/2022  8:15 AM Donnamae Jude, MD [facility]  06/05/2022  8:15 AM [facility] NST [facility]  06/05/2022  9:15 AM Donnamae Jude, MD [facility]  06/12/2022  8:15 AM [facility] NST [facility]  06/12/2022  9:15 AM Donnamae Jude, MD [facility]  06/17/2022  9:45 AM [facility] NURSE [facility]  06/17/2022 10:00 AM [facility] US1 [facility]  06/19/2022  8:15 AM [facility] NST [facility]  06/19/2022  9:15 AM Donnamae Jude, MD [facility]  06/26/2022  8:15 AM [facility] NST [facility]  06/26/2022  9:15 AM Reva Bores, MD [facility]  06/28/2022  8:15 AM [facility] NURSE [facility]  06/28/2022  8:30 AM [facility] US2 [facility]  07/03/2022  8:15 AM [facility] NST [facility]  07/03/2022  9:15 AM [place] Bing, MD [facility]  07/10/2022   8:15 AM [facility] NST [facility]  07/10/2022  9:15 AM Reva Bores, MD [facility]    Reva Bores, MD

## 2022-05-23 ENCOUNTER — Encounter: Payer: Medicaid Other | Admitting: Advanced Practice Midwife

## 2022-05-23 LAB — COMPREHENSIVE METABOLIC PANEL
ALT: 14 IU/L (ref 0–32)
AST: 20 IU/L (ref 0–40)
Albumin/Globulin Ratio: 1.5 (ref 1.2–2.2)
Albumin: 3.7 g/dL — ABNORMAL LOW (ref 3.9–4.9)
Alkaline Phosphatase: 107 IU/L (ref 44–121)
BUN/Creatinine Ratio: 13 (ref 9–23)
BUN: 6 mg/dL (ref 6–20)
Bilirubin Total: 0.3 mg/dL (ref 0.0–1.2)
CO2: 25 mmol/L (ref 20–29)
Calcium: 8.9 mg/dL (ref 8.7–10.2)
Chloride: 101 mmol/L (ref 96–106)
Creatinine, Ser: 0.46 mg/dL — ABNORMAL LOW (ref 0.57–1.00)
Globulin, Total: 2.4 g/dL (ref 1.5–4.5)
Glucose: 62 mg/dL — ABNORMAL LOW (ref 70–99)
Potassium: 3.6 mmol/L (ref 3.5–5.2)
Sodium: 139 mmol/L (ref 134–144)
Total Protein: 6.1 g/dL (ref 6.0–8.5)
eGFR: 129 mL/min/{1.73_m2} (ref >59)

## 2022-05-23 LAB — CBC
Hematocrit: 37 % (ref 34.0–46.6)
Hemoglobin: 12.2 g/dL (ref 11.1–15.9)
MCH: 30.7 pg (ref 26.6–33.0)
MCHC: 33 g/dL (ref 31.5–35.7)
MCV: 93 fL (ref 79–97)
Platelets: 276 10*3/uL (ref 150–450)
RBC: 3.98 x10E6/uL (ref 3.77–5.28)
RDW: 12.3 % (ref 11.7–15.4)
WBC: 7.9 10*3/uL (ref 3.4–10.8)

## 2022-05-23 LAB — PROTEIN / CREATININE RATIO, URINE
Creatinine, Urine: 19.1 mg/dL
Protein, Ur: 5.6 mg/dL
Protein/Creat Ratio: 293 mg/g creat — ABNORMAL HIGH (ref 0–200)

## 2022-05-24 ENCOUNTER — Other Ambulatory Visit: Payer: Self-pay | Admitting: *Deleted

## 2022-05-24 ENCOUNTER — Ambulatory Visit (INDEPENDENT_AMBULATORY_CARE_PROVIDER_SITE_OTHER): Payer: Medicaid Other | Admitting: *Deleted

## 2022-05-24 VITALS — BP 165/99 | HR 94

## 2022-05-24 DIAGNOSIS — O0993 Supervision of high risk pregnancy, unspecified, third trimester: Secondary | ICD-10-CM

## 2022-05-24 DIAGNOSIS — O1403 Mild to moderate pre-eclampsia, third trimester: Secondary | ICD-10-CM | POA: Diagnosis not present

## 2022-05-24 DIAGNOSIS — Z3A31 31 weeks gestation of pregnancy: Secondary | ICD-10-CM

## 2022-05-24 DIAGNOSIS — R8271 Bacteriuria: Secondary | ICD-10-CM

## 2022-05-24 DIAGNOSIS — O1490 Unspecified pre-eclampsia, unspecified trimester: Secondary | ICD-10-CM

## 2022-05-24 DIAGNOSIS — O09293 Supervision of pregnancy with other poor reproductive or obstetric history, third trimester: Secondary | ICD-10-CM

## 2022-05-24 DIAGNOSIS — O09899 Supervision of other high risk pregnancies, unspecified trimester: Secondary | ICD-10-CM

## 2022-05-24 NOTE — Progress Notes (Signed)
Subjective:  Traci King is a 31 y.o. female here for BP check.   Hypertension ROS: Patient denies any headaches, visual symptoms, RUQ/epigastric pain or other concerning symptoms.  Objective:  BP (!) 165/99   Pulse 94   LMP 10/01/2021   Appearance alert, well appearing, and in no distress. General exam BP noted to be elevated today in office.    Assessment:   Blood Pressure needs improvement.  NST reactive and reassuring.  Plan:  Discussed BP with Dr A, will increase Procardia to 60mg  and do NST today. Pt to return on 05/28/22 for BP recheck. Baby scripts order sent for pt to enter her BP's over the weekend.   07/27/22, RN .

## 2022-05-27 NOTE — L&D Delivery Note (Signed)
OB/GYN Faculty Practice Delivery Note  Traci King is a 32 y.o. [id number] s/p SVD at [redacted]w[redacted]d She was admitted for IOL for preeclampsia and FGR.   ROM: 2h 085mith clear fluid GBS Status:  Positive/-- (02/07 0000) Maximum Maternal Temperature:  Temp (24hrs), Avg:98.4 F (36.9 C), Min:97.6 F (36.4 C), Max:99.3 F (37.4 C)    Labor Progress: Patient arrived at 0.5 cm dilation and was induced with cytotec, AROM.   Delivery Date/Time: 07/05/2022 at 2021 Delivery: Called to room and patient was complete and pushing. Head delivered in LOA position. Compound left hand and shoulder cord. Shoulder and body delivered in usual fashion. Infant with spontaneous cry, placed on mother's abdomen, dried and stimulated. Cord clamped x 2 after 1-minute delay, and cut by support person. Cord blood drawn. Placenta delivered spontaneously with gentle cord traction. Fundus firm with massage and Pitocin. Labia, perineum, vagina, and cervix inspected with hemostatic periclitoral laceration.   Placenta: spontaneous, intact, 3 vessel cord Complications: None  Lacerations: peri-clitoral  EBL: 81 mL Analgesia: None   Infant: APGAR (1 MIN):   APGAR (5 MINS):   APGAR (10 MINS):    Weight: Pending  ViGifford ShaveMD  OB Fellow  07/05/2022 9:18 PM

## 2022-05-28 ENCOUNTER — Ambulatory Visit (INDEPENDENT_AMBULATORY_CARE_PROVIDER_SITE_OTHER): Payer: Medicaid Other | Admitting: *Deleted

## 2022-05-28 VITALS — BP 136/97 | HR 98 | Wt 139.0 lb

## 2022-05-28 DIAGNOSIS — Z3A31 31 weeks gestation of pregnancy: Secondary | ICD-10-CM

## 2022-05-28 DIAGNOSIS — O1403 Mild to moderate pre-eclampsia, third trimester: Secondary | ICD-10-CM

## 2022-05-28 NOTE — Progress Notes (Signed)
Subjective:  Traci King is a 32 y.o. female here for BP check.   Hypertension ROS: Patient denies any headaches, visual symptoms, RUQ/epigastric pain or other concerning symptoms.  Objective:  BP (!) 136/97   Pulse 98   Wt 139 lb (63 kg)   LMP 10/01/2021   BMI 24.62 kg/m   Appearance alert, well appearing, and in no distress. General exam BP noted to be improved today in office.    Assessment:   Blood Pressure improved.   Plan:  Follow up tomorrow at [facility], RN

## 2022-05-29 ENCOUNTER — Ambulatory Visit (INDEPENDENT_AMBULATORY_CARE_PROVIDER_SITE_OTHER): Payer: Medicaid Other

## 2022-05-29 ENCOUNTER — Ambulatory Visit (INDEPENDENT_AMBULATORY_CARE_PROVIDER_SITE_OTHER): Payer: Medicaid Other | Admitting: Family Medicine

## 2022-05-29 VITALS — BP 125/82 | HR 92 | Wt 138.0 lb

## 2022-05-29 DIAGNOSIS — Z3A31 31 weeks gestation of pregnancy: Secondary | ICD-10-CM

## 2022-05-29 DIAGNOSIS — O0993 Supervision of high risk pregnancy, unspecified, third trimester: Secondary | ICD-10-CM

## 2022-05-29 DIAGNOSIS — R0602 Shortness of breath: Secondary | ICD-10-CM

## 2022-05-29 DIAGNOSIS — O1403 Mild to moderate pre-eclampsia, third trimester: Secondary | ICD-10-CM | POA: Diagnosis not present

## 2022-05-29 DIAGNOSIS — Z8619 Personal history of other infectious and parasitic diseases: Secondary | ICD-10-CM

## 2022-05-29 DIAGNOSIS — R8271 Bacteriuria: Secondary | ICD-10-CM

## 2022-05-29 MED ORDER — VALACYCLOVIR HCL 1 G PO TABS: 1000.0000 mg | ORAL_TABLET | Freq: Every day | ORAL | 2 refills | Status: DC

## 2022-05-29 NOTE — Progress Notes (Signed)
PRENATAL VISIT NOTE  Subjective:  Traci King is a 32 y.o. [phone] at [redacted]w[redacted]d being seen today for ongoing prenatal care.  She is currently monitored for the following issues for this high-risk pregnancy and has GBS bacteriuria; Supervision of high risk pregnancy in third trimester; Marijuana abuse; Nausea and vomiting during pregnancy prior to [redacted] weeks gestation; History of herpes genitalis; History of preterm delivery, currently pregnant; Cannabinoid hyperemesis syndrome; Abnormal alpha fetoprotein (AFP) level; Mild pre-eclampsia; and Uterine synechiae on their problem list.  Patient reports no complaints.  Contractions: Irritability. Vag. Bleeding: None.  Movement: Present. Denies leaking of fluid.   The following portions of the patient's history were reviewed and updated as appropriate: allergies, current medications, past family history, past medical history, past social history, past surgical history and problem list.   Objective:   Vitals:   05/29/22 0827 05/29/22 0926  BP: (!) 164/84 125/82  Pulse: 92   Weight: 138 lb (62.6 kg)     Fetal Status:     Movement: Present  Presentation: Vertex  General:  Alert, oriented and cooperative. Patient is in no acute distress.  Skin: Skin is warm and dry. No rash noted.   Cardiovascular: Normal heart rate noted  Respiratory: Normal respiratory effort, no problems with respiration noted  Abdomen: Soft, gravid, appropriate for gestational age.  Pain/Pressure: Present     Pelvic: Cervical exam deferred        Extremities: Normal range of motion.  Edema: None  Mental Status: Normal mood and affect. Normal behavior. Normal judgment and thought content.  NST:  Baseline: 140 bpm, Variability: Good {> 6 bpm), Accelerations: Reactive, and Decelerations: Absent  Assessment and Plan:  Pregnancy: [id number] at [redacted]w[redacted]d 1. Mild pre-eclampsia in third trimester BP is much improved at home. Taking Procardia 60 mg daily--recheck is WNL Weekly  labs BPs at home are 110-120/80s - US FETAL BPP W/NONSTRESS; Future - CBC - Comprehensive metabolic panel - Protein / creatinine ratio, urine  2. Short of breath on exertion   3. Supervision of high risk pregnancy in third trimester - US FETAL BPP W/NONSTRESS; Future  4. GBS bacteriuria Will need treatment in labor  5. History of herpes genitalis HSV 1 in 2013--begin ppx, given possible need for early delivery. - valACYclovir (VALTREX) 1000 MG tablet; Take 1 tablet (1,000 mg total) by mouth daily.  Dispense: 30 tablet; Refill: 2  Preterm labor symptoms and general obstetric precautions including but not limited to vaginal bleeding, contractions, leaking of fluid and fetal movement were reviewed in detail with the patient. Please refer to After Visit Summary for other counseling recommendations.   Return in 1 week (on 06/05/2022).  Future Appointments  Date Time Provider [place]  06/05/2022  8:15 AM [facility] NST [facility]  06/05/2022  9:15 AM Donnamae Jude, MD [facility]  06/12/2022  8:15 AM [facility] NST [facility]  06/12/2022  9:15 AM Donnamae Jude, MD [facility]  06/14/2022  3:00 PM Berniece Salines, DO [facility] None  06/17/2022  9:45 AM [facility] NURSE [facility]  06/17/2022 10:00 AM [facility] US1 [facility]  06/19/2022  8:15 AM [facility] NST [facility]  06/19/2022  9:15 AM Donnamae Jude, MD [facility]  06/26/2022  8:15 AM [facility] NST [facility]  06/26/2022  9:15 AM Donnamae Jude, MD [facility]  06/28/2022  8:15 AM [facility] NURSE [facility]  06/28/2022  8:30 AM [facility] US2 [facility]  07/03/2022  8:15 AM [facility] NST [facility]  07/03/2022  9:15 AM Aletha Halim, MD [facility]  07/10/2022  8:15 AM [facility] NST [facility]  07/10/2022  9:15 AM Donnamae Jude, MD [facility]    Donnamae Jude, MD

## 2022-05-29 NOTE — Progress Notes (Signed)
Patient informed that the ultrasound is considered a limited obstetric ultrasound and is not intended to be a complete ultrasound exam.  Patient also informed that the ultrasound is not being completed with the intent of assessing for fetal or placental anomalies or any pelvic abnormalities. Explained that the purpose of today's ultrasound is to assess for fetal wellbeing.  Patient acknowledges the purpose of the exam and the limitations of the study.         

## 2022-05-30 LAB — COMPREHENSIVE METABOLIC PANEL
ALT: 19 IU/L (ref 0–32)
AST: 21 IU/L (ref 0–40)
Albumin/Globulin Ratio: 1.6 (ref 1.2–2.2)
Albumin: 3.8 g/dL — ABNORMAL LOW (ref 3.9–4.9)
Alkaline Phosphatase: 118 IU/L (ref 44–121)
BUN/Creatinine Ratio: 13 (ref 9–23)
BUN: 6 mg/dL (ref 6–20)
Bilirubin Total: 0.3 mg/dL (ref 0.0–1.2)
CO2: 24 mmol/L (ref 20–29)
Calcium: 9.2 mg/dL (ref 8.7–10.2)
Chloride: 103 mmol/L (ref 96–106)
Creatinine, Ser: 0.47 mg/dL — ABNORMAL LOW (ref 0.57–1.00)
Globulin, Total: 2.4 g/dL (ref 1.5–4.5)
Glucose: 80 mg/dL (ref 70–99)
Potassium: 3.3 mmol/L — ABNORMAL LOW (ref 3.5–5.2)
Sodium: 140 mmol/L (ref 134–144)
Total Protein: 6.2 g/dL (ref 6.0–8.5)
eGFR: 129 mL/min/{1.73_m2} (ref >59)

## 2022-05-30 LAB — CBC
Hematocrit: 38.2 % (ref 34.0–46.6)
Hemoglobin: 12.9 g/dL (ref 11.1–15.9)
MCH: 30.5 pg (ref 26.6–33.0)
MCHC: 33.8 g/dL (ref 31.5–35.7)
MCV: 90 fL (ref 79–97)
Platelets: 253 10*3/uL (ref 150–450)
RBC: 4.23 x10E6/uL (ref 3.77–5.28)
RDW: 11.9 % (ref 11.7–15.4)
WBC: 7.6 10*3/uL (ref 3.4–10.8)

## 2022-05-30 LAB — PROTEIN / CREATININE RATIO, URINE
Creatinine, Urine: 77.4 mg/dL
Protein, Ur: 14 mg/dL
Protein/Creat Ratio: 181 mg/g creat (ref 0–200)

## 2022-06-05 ENCOUNTER — Ambulatory Visit (INDEPENDENT_AMBULATORY_CARE_PROVIDER_SITE_OTHER): Payer: Medicaid Other | Admitting: *Deleted

## 2022-06-05 ENCOUNTER — Ambulatory Visit (INDEPENDENT_AMBULATORY_CARE_PROVIDER_SITE_OTHER): Payer: Medicaid Other | Admitting: Family Medicine

## 2022-06-05 ENCOUNTER — Ambulatory Visit (INDEPENDENT_AMBULATORY_CARE_PROVIDER_SITE_OTHER): Payer: Medicaid Other

## 2022-06-05 VITALS — BP 138/96 | HR 73 | Wt 138.0 lb

## 2022-06-05 DIAGNOSIS — O0993 Supervision of high risk pregnancy, unspecified, third trimester: Secondary | ICD-10-CM

## 2022-06-05 DIAGNOSIS — O1403 Mild to moderate pre-eclampsia, third trimester: Secondary | ICD-10-CM

## 2022-06-05 DIAGNOSIS — Z3A32 32 weeks gestation of pregnancy: Secondary | ICD-10-CM

## 2022-06-05 DIAGNOSIS — O09893 Supervision of other high risk pregnancies, third trimester: Secondary | ICD-10-CM

## 2022-06-05 DIAGNOSIS — R8271 Bacteriuria: Secondary | ICD-10-CM

## 2022-06-05 DIAGNOSIS — Z8619 Personal history of other infectious and parasitic diseases: Secondary | ICD-10-CM

## 2022-06-05 DIAGNOSIS — O09899 Supervision of other high risk pregnancies, unspecified trimester: Secondary | ICD-10-CM

## 2022-06-05 DIAGNOSIS — O98813 Other maternal infectious and parasitic diseases complicating pregnancy, third trimester: Secondary | ICD-10-CM

## 2022-06-05 NOTE — Progress Notes (Addendum)
   PRENATAL VISIT NOTE  Subjective:  Traci King is a 32 y.o. [phone] at [redacted]w[redacted]d being seen today for ongoing prenatal care.  She is currently monitored for the following issues for this low-risk pregnancy and has GBS bacteriuria; Supervision of high risk pregnancy in third trimester; Marijuana abuse; Nausea and vomiting during pregnancy prior to [redacted] weeks gestation; History of herpes genitalis; History of preterm delivery, currently pregnant; Cannabinoid hyperemesis syndrome; Abnormal alpha fetoprotein (AFP) level; Mild pre-eclampsia; and Uterine synechiae on their problem list.  Patient reports no complaints.   .  .   . Denies leaking of fluid.   The following portions of the patient's history were reviewed and updated as appropriate: allergies, current medications, past family history, past medical history, past social history, past surgical history and problem list.   Objective:  There were no vitals filed for this visit.  Fetal Status: Fetal Heart Rate (bpm): NST         General:  Alert, oriented and cooperative. Patient is in no acute distress.  Skin: Skin is warm and dry. No rash noted.   Cardiovascular: Normal heart rate noted  Respiratory: Normal respiratory effort, no problems with respiration noted  Abdomen: Soft, gravid, appropriate for gestational age.        Pelvic: Cervical exam deferred        Extremities: Normal range of motion.     Mental Status: Normal mood and affect. Normal behavior. Normal judgment and thought content.  NST:  Baseline: 145 bpm, Variability: Good {> 6 bpm), Accelerations: Reactive, and Decelerations: Absent  Assessment and Plan:  Pregnancy: [id number] at [redacted]w[redacted]d 1. Mild pre-eclampsia in third trimester Repeat labs today-- BP is well contorolled on procardia, U/s for growth in 2 weeks - CBC - Comprehensive metabolic panel - Protein / creatinine ratio, urine  2. GBS bacteriuria Will need treatment in labor  3. Supervision of high risk pregnancy  in third trimester   4. History of preterm delivery, currently pregnant   5. History of herpes genitalis To begin Valtrex - reviewed indications HSV 1 noted in 2014.  Preterm labor symptoms and general obstetric precautions including but not limited to vaginal bleeding, contractions, leaking of fluid and fetal movement were reviewed in detail with the patient. Please refer to After Visit Summary for other counseling recommendations.   Return in 1 week (on 06/12/2022).  Future Appointments  Date Time Provider [place]  06/12/2022  8:15 AM [facility] NST [facility]  06/12/2022  9:15 AM Donnamae Jude, MD [facility]  06/14/2022  3:00 PM Berniece Salines, DO [facility] None  06/17/2022  9:45 AM [facility] NURSE [facility]  06/17/2022 10:00 AM [facility] US1 [facility]  06/19/2022  8:15 AM [facility] NST [facility]  06/19/2022  9:15 AM Donnamae Jude, MD [facility]  06/26/2022  8:15 AM [facility] NST [facility]  06/26/2022  9:15 AM Donnamae Jude, MD [facility]  06/28/2022  8:15 AM [facility] NURSE [facility]  06/28/2022  8:30 AM [facility] US2 [facility]  07/03/2022  8:15 AM [facility] NST [facility]  07/03/2022  9:15 AM Aletha Halim, MD [facility]  07/10/2022  8:15 AM [facility] NST [facility]  07/10/2022  9:15 AM Donnamae Jude, MD [facility]    Donnamae Jude, MD

## 2022-06-05 NOTE — Progress Notes (Signed)
Patient informed that the ultrasound is considered a limited obstetric ultrasound and is not intended to be a complete ultrasound exam.  Patient also informed that the ultrasound is not being completed with the intent of assessing for fetal or placental anomalies or any pelvic abnormalities. Explained that the purpose of today's ultrasound is to assess for fetal wellbeing.  Patient acknowledges the purpose of the exam and the limitations of the study.         

## 2022-06-06 ENCOUNTER — Other Ambulatory Visit: Payer: Self-pay | Admitting: *Deleted

## 2022-06-06 ENCOUNTER — Encounter: Payer: Medicaid Other | Admitting: Advanced Practice Midwife

## 2022-06-06 LAB — CBC
Hematocrit: 34.2 % (ref 34.0–46.6)
Hemoglobin: 11.6 g/dL (ref 11.1–15.9)
MCH: 30.2 pg (ref 26.6–33.0)
MCHC: 33.9 g/dL (ref 31.5–35.7)
MCV: 89 fL (ref 79–97)
Platelets: 237 10*3/uL (ref 150–450)
RBC: 3.84 x10E6/uL (ref 3.77–5.28)
RDW: 11.8 % (ref 11.7–15.4)
WBC: 7 10*3/uL (ref 3.4–10.8)

## 2022-06-06 LAB — COMPREHENSIVE METABOLIC PANEL
ALT: 15 IU/L (ref 0–32)
AST: 18 IU/L (ref 0–40)
Albumin/Globulin Ratio: 1.5 (ref 1.2–2.2)
Albumin: 3.2 g/dL — ABNORMAL LOW (ref 3.9–4.9)
Alkaline Phosphatase: 108 IU/L (ref 44–121)
BUN/Creatinine Ratio: 13 (ref 9–23)
BUN: 6 mg/dL (ref 6–20)
Bilirubin Total: 0.2 mg/dL (ref 0.0–1.2)
CO2: 21 mmol/L (ref 20–29)
Calcium: 8.7 mg/dL (ref 8.7–10.2)
Chloride: 103 mmol/L (ref 96–106)
Creatinine, Ser: 0.48 mg/dL — ABNORMAL LOW (ref 0.57–1.00)
Globulin, Total: 2.2 g/dL (ref 1.5–4.5)
Glucose: 102 mg/dL — ABNORMAL HIGH (ref 70–99)
Potassium: 3.1 mmol/L — ABNORMAL LOW (ref 3.5–5.2)
Sodium: 139 mmol/L (ref 134–144)
Total Protein: 5.4 g/dL — ABNORMAL LOW (ref 6.0–8.5)
eGFR: 128 mL/min/{1.73_m2} (ref >59)

## 2022-06-06 LAB — PROTEIN / CREATININE RATIO, URINE
Creatinine, Urine: 290.2 mg/dL
Protein, Ur: 89.3 mg/dL
Protein/Creat Ratio: 308 mg/g creat — ABNORMAL HIGH (ref 0–200)

## 2022-06-06 MED ORDER — NIFEDIPINE ER 60 MG PO TB24: 60.0000 mg | ORAL_TABLET | Freq: Every day | ORAL | 1 refills | Status: DC

## 2022-06-12 ENCOUNTER — Ambulatory Visit (INDEPENDENT_AMBULATORY_CARE_PROVIDER_SITE_OTHER): Payer: Medicaid Other | Admitting: *Deleted

## 2022-06-12 ENCOUNTER — Encounter: Payer: Self-pay | Admitting: Family Medicine

## 2022-06-12 ENCOUNTER — Ambulatory Visit (INDEPENDENT_AMBULATORY_CARE_PROVIDER_SITE_OTHER): Payer: Medicaid Other | Admitting: Family Medicine

## 2022-06-12 ENCOUNTER — Ambulatory Visit (INDEPENDENT_AMBULATORY_CARE_PROVIDER_SITE_OTHER): Payer: Medicaid Other

## 2022-06-12 VITALS — BP 129/84 | HR 84 | Wt 142.0 lb

## 2022-06-12 DIAGNOSIS — O1403 Mild to moderate pre-eclampsia, third trimester: Secondary | ICD-10-CM

## 2022-06-12 DIAGNOSIS — R8271 Bacteriuria: Secondary | ICD-10-CM

## 2022-06-12 DIAGNOSIS — O0993 Supervision of high risk pregnancy, unspecified, third trimester: Secondary | ICD-10-CM

## 2022-06-12 DIAGNOSIS — Z3A33 33 weeks gestation of pregnancy: Secondary | ICD-10-CM

## 2022-06-12 DIAGNOSIS — Z8619 Personal history of other infectious and parasitic diseases: Secondary | ICD-10-CM

## 2022-06-12 NOTE — Progress Notes (Signed)
   PRENATAL VISIT NOTE  Subjective:  Traci King is a 32 y.o. [phone] at [redacted]w[redacted]d being seen today for ongoing prenatal care.  She is currently monitored for the following issues for this high-risk pregnancy and has GBS bacteriuria; Supervision of high risk pregnancy in third trimester; Marijuana abuse; Nausea and vomiting during pregnancy prior to [redacted] weeks gestation; History of herpes genitalis; History of preterm delivery, currently pregnant; Cannabinoid hyperemesis syndrome; Abnormal alpha fetoprotein (AFP) level; Mild pre-eclampsia; and Uterine synechiae on their problem list.  Patient reports no complaints.   .  .   . Denies leaking of fluid.   The following portions of the patient's history were reviewed and updated as appropriate: allergies, current medications, past family history, past medical history, past social history, past surgical history and problem list.   Objective:  There were no vitals filed for this visit.  Fetal Status:           General:  Alert, oriented and cooperative. Patient is in no acute distress.  Skin: Skin is warm and dry. No rash noted.   Cardiovascular: Normal heart rate noted  Respiratory: Normal respiratory effort, no problems with respiration noted  Abdomen: Soft, gravid, appropriate for gestational age.        Pelvic: Cervical exam deferred        Extremities: Normal range of motion.     Mental Status: Normal mood and affect. Normal behavior. Normal judgment and thought content.  NST:  Baseline: 145 bpm, Variability: Good {> 6 bpm), Accelerations: Reactive, and Decelerations: Absent  Assessment and Plan:  Pregnancy: [id number] at [redacted]w[redacted]d 1. Mild pre-eclampsia in third trimester Labs today BP is well controlled on Procardia EFW is 17%--f/u next week IOL scheduled at 37 weeks Antenatal testing is reassuring, vtx, AFI is 9.8 - CBC - Comprehensive metabolic panel - Protein / creatinine ratio, urine  2. Supervision of high risk pregnancy in third  trimester   3. History of herpes genitalis On Valtrex  4. GBS bacteriuria Will need treatment in labor  Preterm labor symptoms and general obstetric precautions including but not limited to vaginal bleeding, contractions, leaking of fluid and fetal movement were reviewed in detail with the patient. Please refer to After Visit Summary for other counseling recommendations.   Return in about 1 week (around 06/19/2022) for [facility].  Future Appointments  Date Time Provider [place]  06/14/2022  3:00 PM Berniece Salines, DO [facility] None  06/17/2022  9:45 AM [facility] NURSE [facility]  06/17/2022 10:00 AM [facility] US1 [facility]  06/19/2022  8:15 AM [facility] NST [facility]  06/19/2022  9:15 AM Donnamae Jude, MD [facility]  06/26/2022  8:15 AM [facility] NST [facility]  06/26/2022  9:15 AM Donnamae Jude, MD [facility]  06/28/2022  8:15 AM [facility] NURSE [facility]  06/28/2022  8:30 AM [facility] US2 [facility]  07/03/2022  8:15 AM [facility] NST [facility]  07/03/2022  9:15 AM Aletha Halim, MD [facility]    Donnamae Jude, MD

## 2022-06-12 NOTE — Progress Notes (Signed)
BPP/NST 10/10

## 2022-06-12 NOTE — Progress Notes (Signed)
Patient informed that the ultrasound is considered a limited obstetric ultrasound and is not intended to be a complete ultrasound exam.  Patient also informed that the ultrasound is not being completed with the intent of assessing for fetal or placental anomalies or any pelvic abnormalities. Explained that the purpose of today's ultrasound is to assess for fetal well being.  Patient acknowledges the purpose of the exam and the limitations of the study.      Sharnice Bosler, RN      

## 2022-06-13 ENCOUNTER — Other Ambulatory Visit: Payer: Self-pay

## 2022-06-13 ENCOUNTER — Other Ambulatory Visit: Payer: Self-pay | Admitting: Family Medicine

## 2022-06-13 ENCOUNTER — Encounter (HOSPITAL_COMMUNITY): Payer: Self-pay | Admitting: Obstetrics & Gynecology

## 2022-06-13 ENCOUNTER — Inpatient Hospital Stay (HOSPITAL_COMMUNITY)
Admission: AD | Admit: 2022-06-13 | Discharge: 2022-06-13 | Disposition: A | Discharge status: Home / Self Care | Payer: Medicaid Other | Attending: Obstetrics & Gynecology | Admitting: Obstetrics & Gynecology

## 2022-06-13 DIAGNOSIS — Z87891 Personal history of nicotine dependence: Secondary | ICD-10-CM | POA: Insufficient documentation

## 2022-06-13 DIAGNOSIS — O1403 Mild to moderate pre-eclampsia, third trimester: Principal | ICD-10-CM | POA: Insufficient documentation

## 2022-06-13 DIAGNOSIS — O26893 Other specified pregnancy related conditions, third trimester: Secondary | ICD-10-CM | POA: Diagnosis not present

## 2022-06-13 DIAGNOSIS — B951 Streptococcus, group B, as the cause of diseases classified elsewhere: Secondary | ICD-10-CM | POA: Diagnosis not present

## 2022-06-13 DIAGNOSIS — R8271 Bacteriuria: Secondary | ICD-10-CM

## 2022-06-13 DIAGNOSIS — Z3A33 33 weeks gestation of pregnancy: Secondary | ICD-10-CM

## 2022-06-13 DIAGNOSIS — Z8249 Family history of ischemic heart disease and other diseases of the circulatory system: Secondary | ICD-10-CM | POA: Diagnosis not present

## 2022-06-13 DIAGNOSIS — R519 Headache, unspecified: Secondary | ICD-10-CM

## 2022-06-13 DIAGNOSIS — O98813 Other maternal infectious and parasitic diseases complicating pregnancy, third trimester: Secondary | ICD-10-CM | POA: Insufficient documentation

## 2022-06-13 HISTORY — DX: Unspecified pre-eclampsia, unspecified trimester: O14.90

## 2022-06-13 LAB — COMPREHENSIVE METABOLIC PANEL
ALT: 18 IU/L (ref 0–32)
ALT: 21 U/L (ref 0–44)
AST: 20 IU/L (ref 0–40)
AST: 27 U/L (ref 15–41)
Albumin/Globulin Ratio: 1.5 (ref 1.2–2.2)
Albumin: 3.5 g/dL — ABNORMAL LOW (ref 3.9–4.9)
Albumin: 2.8 g/dL — ABNORMAL LOW (ref 3.5–5.0)
Alkaline Phosphatase: 116 IU/L (ref 44–121)
Alkaline Phosphatase: 108 U/L (ref 38–126)
Anion gap: 10 (ref 5–15)
BUN/Creatinine Ratio: 14 (ref 9–23)
BUN: 7 mg/dL (ref 6–20)
BUN: 7 mg/dL (ref 6–20)
Bilirubin Total: 0.3 mg/dL (ref 0.0–1.2)
CO2: 21 mmol/L (ref 20–29)
CO2: 22 mmol/L (ref 22–32)
Calcium: 8.6 mg/dL — ABNORMAL LOW (ref 8.7–10.2)
Calcium: 8.8 mg/dL — ABNORMAL LOW (ref 8.9–10.3)
Chloride: 105 mmol/L (ref 96–106)
Chloride: 105 mmol/L (ref 98–111)
Creatinine, Ser: 0.49 mg/dL — ABNORMAL LOW (ref 0.57–1.00)
Creatinine, Ser: 0.49 mg/dL (ref 0.44–1.00)
GFR, Estimated: >60 mL/min (ref >60)
Globulin, Total: 2.4 g/dL (ref 1.5–4.5)
Glucose, Bld: 108 mg/dL — ABNORMAL HIGH (ref 70–99)
Glucose: 64 mg/dL — ABNORMAL LOW (ref 70–99)
Potassium: 3.5 mmol/L (ref 3.5–5.2)
Potassium: 2.9 mmol/L — ABNORMAL LOW (ref 3.5–5.1)
Sodium: 141 mmol/L (ref 134–144)
Sodium: 137 mmol/L (ref 135–145)
Total Bilirubin: 0.2 mg/dL — ABNORMAL LOW (ref 0.3–1.2)
Total Protein: 5.9 g/dL — ABNORMAL LOW (ref 6.0–8.5)
Total Protein: 6.3 g/dL — ABNORMAL LOW (ref 6.5–8.1)
eGFR: 128 mL/min/{1.73_m2} (ref >59)

## 2022-06-13 LAB — CBC WITH DIFFERENTIAL/PLATELET
Abs Immature Granulocytes: 0.03 10*3/uL (ref 0.00–0.07)
Basophils Absolute: 0 10*3/uL (ref 0.0–0.1)
Basophils Relative: 0 %
Eosinophils Absolute: 0.1 10*3/uL (ref 0.0–0.5)
Eosinophils Relative: 1 %
HCT: 34.4 % — ABNORMAL LOW (ref 36.0–46.0)
Hemoglobin: 11.8 g/dL — ABNORMAL LOW (ref 12.0–15.0)
Immature Granulocytes: 0 %
Lymphocytes Relative: 22 %
Lymphs Abs: 1.9 10*3/uL (ref 0.7–4.0)
MCH: 31.1 pg (ref 26.0–34.0)
MCHC: 34.3 g/dL (ref 30.0–36.0)
MCV: 90.5 fL (ref 80.0–100.0)
Monocytes Absolute: 0.8 10*3/uL (ref 0.1–1.0)
Monocytes Relative: 9 %
Neutro Abs: 5.8 10*3/uL (ref 1.7–7.7)
Neutrophils Relative %: 68 %
Platelets: 247 10*3/uL (ref 150–400)
RBC: 3.8 MIL/uL — ABNORMAL LOW (ref 3.87–5.11)
RDW: 12.2 % (ref 11.5–15.5)
WBC: 8.6 10*3/uL (ref 4.0–10.5)
nRBC: 0 % (ref 0.0–0.2)

## 2022-06-13 LAB — CBC
Hematocrit: 36.9 % (ref 34.0–46.6)
Hemoglobin: 12.2 g/dL (ref 11.1–15.9)
MCH: 30.5 pg (ref 26.6–33.0)
MCHC: 33.1 g/dL (ref 31.5–35.7)
MCV: 92 fL (ref 79–97)
Platelets: 259 10*3/uL (ref 150–450)
RBC: 4 x10E6/uL (ref 3.77–5.28)
RDW: 12.7 % (ref 11.7–15.4)
WBC: 7.4 10*3/uL (ref 3.4–10.8)

## 2022-06-13 LAB — URINALYSIS, ROUTINE W REFLEX MICROSCOPIC
Bilirubin Urine: NEGATIVE
Glucose, UA: NEGATIVE mg/dL
Hgb urine dipstick: NEGATIVE
Ketones, ur: NEGATIVE mg/dL
Nitrite: NEGATIVE
Protein, ur: NEGATIVE mg/dL
Specific Gravity, Urine: 1.009 (ref 1.005–1.030)
pH: 7 (ref 5.0–8.0)

## 2022-06-13 LAB — PROTEIN / CREATININE RATIO, URINE
Creatinine, Urine: 84.5 mg/dL
Creatinine, Urine: 39 mg/dL
Protein Creatinine Ratio: 0.26 mg/mg{Cre} — ABNORMAL HIGH (ref 0.00–0.15)
Protein, Ur: 16.5 mg/dL
Protein/Creat Ratio: 195 mg/g creat (ref 0–200)
Total Protein, Urine: 10 mg/dL

## 2022-06-13 MED ORDER — ACETAMINOPHEN-CAFFEINE 500-65 MG PO TABS
1.0000 | ORAL_TABLET | Freq: Once | ORAL | Status: AC
  Administered 2022-06-13: 1 via ORAL
  Filled 2022-06-13: qty 1

## 2022-06-13 NOTE — MAU Note (Signed)
Pt in MAU reporting severe headache 9/10 on pain scale since 2pm today. Pt took 500mg  tylenol at 2pm with no relief. Pt states swelling in LE is significantly worse today. BP at home 180/101 prior to coming in. Reports positive FM, denies LOF or bleeding

## 2022-06-13 NOTE — MAU Provider Note (Signed)
History     CSN: [id number]  Arrival date and time: 06/13/22 1826   Event Date/Time   First Provider Initiated Contact with Patient 06/13/22 1905      Chief Complaint  Patient presents with   Headache   Traci King , a  32 y.o. [id number] at [redacted]w[redacted]d presents to MAU with complaints of a headache and swelling since 2 pm. Patient reports after work she had a headache. She attempted to relieve it with 500mg  Tab of PO tylenol and a nap and was unchanged. She describes pain as pulsing and across the front of her head. Currently rating pain a 9/10. She denies visual changes. She also noted that she had an increase in swelling. That combined with the headache she took her BP and was 180/101. She came to ED. She denies epigastric pain, vaginal bleeding, abnormal vaginal discharge and leaking of fluid. She notes positive fetal movement.         OB History     Gravida  3   Para  1   Term  0   Preterm  1   AB  1   Living  1      SAB  0   IAB  1   Ectopic  0   Multiple  0   Live Births  1           Past Medical History:  Diagnosis Date   Abnormal Pap smear    Herpes simplex type 1 infection    History of stomach ulcers    Preeclampsia     Past Surgical History:  Procedure Laterality Date   TOOTH EXTRACTION      Family History  Problem Relation Age of Onset   Asthma Mother    Hypertension Mother    Diabetes Mother    Hypertension Maternal Grandmother    Diabetes Maternal Grandfather    Cancer Neg Hx    Heart disease Neg Hx     Social History   Tobacco Use   Smoking status: Former    Packs/day: 0.25    Types: Cigarettes    Quit date: 04/09/2011    Years since quitting: 11.1    Passive exposure: Current   Smokeless tobacco: Never  Vaping Use   Vaping Use: Never used  Substance Use Topics   Alcohol use: No   Drug use: Not Currently    Types: Marijuana    Comment: daily    Allergies: No Known Allergies  Medications Prior to Admission   Medication Sig Dispense Refill Last Dose   NIFEdipine (PROCARDIA XL) 30 MG 24 hr tablet Take 1 tablet (30 mg total) by mouth daily. 30 tablet 1 06/13/2022   Prenatal 28-0.8 MG TABS Take 1 tablet by mouth daily. 30 tablet 12 06/13/2022   valACYclovir (VALTREX) 1000 MG tablet Take 1 tablet (1,000 mg total) by mouth daily. 30 tablet 2 06/13/2022   NIFEdipine (ADALAT CC) 60 MG 24 hr tablet Take 1 tablet (60 mg total) by mouth daily. Can increase to twice a day as needed for symptomatic contractions 30 tablet 1     Review of Systems  Constitutional:  Negative for chills, fatigue and fever.  Eyes:  Negative for pain and visual disturbance.  Respiratory:  Negative for apnea, shortness of breath and wheezing.   Cardiovascular:  Negative for chest pain and palpitations.  Gastrointestinal:  Negative for abdominal pain, constipation, diarrhea, nausea and vomiting.  Genitourinary:  Negative for difficulty urinating, dysuria, pelvic pain, vaginal  bleeding, vaginal discharge and vaginal pain.  Musculoskeletal:  Negative for back pain.       Endorses swelling in the feet.   Neurological:  Positive for headaches. Negative for seizures and weakness.  Psychiatric/Behavioral:  Negative for suicidal ideas.    Physical Exam   Last menstrual period 10/01/2021.  Physical Exam Vitals and nursing note reviewed.  Constitutional:      General: She is not in acute distress.    Appearance: Normal appearance.  HENT:     Head: Normocephalic.  Pulmonary:     Effort: Pulmonary effort is normal.  Musculoskeletal:        General: No swelling. Normal range of motion.     Cervical back: Normal range of motion.     Comments: Mild non-pitting edema noted to the top of her left foot. None on the right.   Skin:    General: Skin is warm and dry.     Capillary Refill: Capillary refill takes less than 2 seconds.  Neurological:     Mental Status: She is alert and oriented to person, place, and time.     GCS: GCS eye  subscore is 4. GCS verbal subscore is 5. GCS motor subscore is 6.     Motor: No weakness.     Coordination: Coordination normal.     Deep Tendon Reflexes: Reflexes normal.  Psychiatric:        Mood and Affect: Mood normal.        Behavior: Behavior normal.   FHT: 145bpm with moderate variability, accels present. No decels (appropriate for gestational age.)  Toco: occasional contractions.   MAU Course  Procedures Orders Placed This Encounter  Procedures   Urinalysis, Routine w reflex microscopic Urine, Clean Catch   CBC with Differential/Platelet   Comprehensive metabolic panel   Protein / creatinine ratio, urine   Meds ordered this encounter  Medications   acetaminophen-caffeine (EXCEDRIN TENSION HEADACHE) 500-65 MG per tablet 1 tablet   Results for orders placed or performed during the hospital encounter of 06/13/22 (from the past 24 hour(s))  CBC with Differential/Platelet     Status: Abnormal   Collection Time: 06/13/22  7:10 PM  Result Value Ref Range   WBC 8.6 4.0 - 10.5 K/uL   RBC 3.80 (L) 3.87 - 5.11 MIL/uL   Hemoglobin 11.8 (L) 12.0 - 15.0 g/dL   HCT 34.4 (L) 36.0 - 46.0 %   MCV 90.5 80.0 - 100.0 fL   MCH 31.1 26.0 - 34.0 pg   MCHC 34.3 30.0 - 36.0 g/dL   RDW 12.2 11.5 - 15.5 %   Platelets 247 150 - 400 K/uL   nRBC 0.0 0.0 - 0.2 %   Neutrophils Relative % 68 %   Neutro Abs 5.8 1.7 - 7.7 K/uL   Lymphocytes Relative 22 %   Lymphs Abs 1.9 0.7 - 4.0 K/uL   Monocytes Relative 9 %   Monocytes Absolute 0.8 0.1 - 1.0 K/uL   Eosinophils Relative 1 %   Eosinophils Absolute 0.1 0.0 - 0.5 K/uL   Basophils Relative 0 %   Basophils Absolute 0.0 0.0 - 0.1 K/uL   Immature Granulocytes 0 %   Abs Immature Granulocytes 0.03 0.00 - 0.07 K/uL  Comprehensive metabolic panel     Status: Abnormal   Collection Time: 06/13/22  7:10 PM  Result Value Ref Range   Sodium 137 135 - 145 mmol/L   Potassium 2.9 (L) 3.5 - 5.1 mmol/L   Chloride 105 98 - 111  mmol/L   CO2 22 22 - 32 mmol/L    Glucose, Bld 108 (H) 70 - 99 mg/dL   BUN 7 6 - 20 mg/dL   Creatinine, Ser 5.63 0.44 - 1.00 mg/dL   Calcium 8.8 (L) 8.9 - 10.3 mg/dL   Total Protein 6.3 (L) 6.5 - 8.1 g/dL   Albumin 2.8 (L) 3.5 - 5.0 g/dL   AST 27 15 - 41 U/L   ALT 21 0 - 44 U/L   Alkaline Phosphatase 108 38 - 126 U/L   Total Bilirubin 0.2 (L) 0.3 - 1.2 mg/dL   GFR, Estimated >87 >56 mL/min   Anion gap 10 5 - 15  Protein / creatinine ratio, urine     Status: Abnormal   Collection Time: 06/13/22  7:10 PM  Result Value Ref Range   Creatinine, Urine 39 mg/dL   Total Protein, Urine 10 mg/dL   Protein Creatinine Ratio 0.26 (H) 0.00 - 0.15 mg/mg[Cre]   Patient Vitals for the past 24 hrs:  BP Pulse  06/13/22 2000 (!) 141/81 86  06/13/22 1945 129/82 79  06/13/22 1930 (!) 143/81 84  06/13/22 1915 (!) 148/84 90  06/13/22 1856 (!) 154/97 (!) 103     MDM - Headache improved from a 9-->3  - PreE labs normal  - No severe range BP in MAU  - No Severe features - Plan for discharge    Assessment and Plan   1. GBS bacteriuria   2. Mild pre-eclampsia in third trimester   3. Pregnancy headache in third trimester   4. [redacted] weeks gestation of pregnancy    - Reviewed headaches can be a normal discomfort of pregnancy.  - Recommended PO Excedrin or 1000mg  of Tylenol for symptom relief.  - Worsening signs and return PreE Precautions reviewed and provided.  - FHT appropriate for gestational age at time of discharge.  - Patient discharged home in stable condition and may return to MAU as needed.   , MSN CNM  06/13/2022, 7:05 PM

## 2022-06-14 ENCOUNTER — Other Ambulatory Visit: Payer: Self-pay | Admitting: Family Medicine

## 2022-06-14 ENCOUNTER — Encounter: Payer: Self-pay | Admitting: Cardiology

## 2022-06-14 ENCOUNTER — Ambulatory Visit (INDEPENDENT_AMBULATORY_CARE_PROVIDER_SITE_OTHER): Payer: Medicaid Other | Admitting: Cardiology

## 2022-06-14 VITALS — BP 152/94 | HR 82 | Ht 63.0 in | Wt 147.5 lb

## 2022-06-14 DIAGNOSIS — R0609 Other forms of dyspnea: Secondary | ICD-10-CM | POA: Diagnosis not present

## 2022-06-14 DIAGNOSIS — O1203 Gestational edema, third trimester: Secondary | ICD-10-CM | POA: Diagnosis not present

## 2022-06-14 DIAGNOSIS — O10919 Unspecified pre-existing hypertension complicating pregnancy, unspecified trimester: Secondary | ICD-10-CM | POA: Diagnosis not present

## 2022-06-14 DIAGNOSIS — O0993 Supervision of high risk pregnancy, unspecified, third trimester: Secondary | ICD-10-CM

## 2022-06-14 DIAGNOSIS — Z3A33 33 weeks gestation of pregnancy: Secondary | ICD-10-CM

## 2022-06-14 MED ORDER — POTASSIUM CHLORIDE ER 10 MEQ PO TBCR: 10.0000 meq | EXTENDED_RELEASE_TABLET | Freq: Every day | ORAL | 0 refills | Status: DC

## 2022-06-14 MED ORDER — FUROSEMIDE 20 MG PO TABS: 20.0000 mg | ORAL_TABLET | Freq: Every day | ORAL | 0 refills | Status: DC

## 2022-06-14 NOTE — Patient Instructions (Signed)
Medication Instructions:  Your physician has recommended you make the following change in your medication:  START: Lasix 20 mg daily START:  Potassium 10 mEq daily *If you need a refill on your cardiac medications before your next appointment, please call your pharmacy*   Lab Work: None If you have labs (blood work) drawn today and your tests are completely normal, you will receive your results only by: [place] (if you have MyChart) OR A paper copy in the mail If you have any lab test that is abnormal or we need to change your treatment, we will call you to review the results.   Testing/Procedures: Your physician has requested that you have an echocardiogram. Echocardiography is a painless test that uses sound waves to create images of your heart. It provides your doctor with information about the size and shape of your heart and how well your heart's chambers and valves are working. This procedure takes approximately one hour. There are no restrictions for this procedure. Please do NOT wear cologne, perfume, aftershave, or lotions (deodorant is allowed). Please arrive 15 minutes prior to your appointment time.   Follow-Up: At [facility], you and your health needs are our priority.  As part of our continuing mission to provide you with exceptional heart care, we have created designated Provider Care Teams.  These Care Teams include your primary Cardiologist (physician) and Advanced Practice Providers (APPs -  Physician Assistants and Nurse Practitioners) who all work together to provide you with the care you need, when you need it.  We recommend signing up for the patient portal called "MyChart".  Sign up information is provided on this After Visit Summary.  MyChart is used to connect with patients for Virtual Visits (Telemedicine).  Patients are able to view lab/test results, encounter notes, upcoming appointments, etc.  Non-urgent messages can be sent to your provider as  well.   To learn more about what you can do with MyChart, go to [email or web address].    Your next appointment:   1 week(s)  Provider:   Berniece Salines  [facility] [address] [place], [place] [address]   Other Instructions

## 2022-06-14 NOTE — Progress Notes (Signed)
Cardio-Obstetrics Clinic  New Evaluation  Date:  06/15/2022   ID:  Traci King, DOB Apr 10, 1991, MRN [id number]  PCP:  Merryl Hacker, No   [facility] Providers Cardiologist:  Berniece Salines, DO  Electrophysiologist:  None       Referring MD: Donnamae Jude, MD   Chief Complaint: " I am short of breath"  History of Present Illness:    Traci King is a 32 y.o. female [[id number]] who is being seen today for the evaluation of Dyspnea on exertion at the request of Donnamae Jude, MD.   Medical history includes hypertension in pregnancy, Mild pre-eclampsia , hx of marijuana abuse, and hx of herpes genitalis  She tells me that she has been experiencing shortness of breath - leg swelling. She also reports elevated blood pressure. No chest pain.    Prior CV Studies Reviewed: The following studies were reviewed today: None today   Past Medical History:  Diagnosis Date   Abnormal Pap smear    Herpes simplex type 1 infection    History of stomach ulcers    Preeclampsia     Past Surgical History:  Procedure Laterality Date   TOOTH EXTRACTION        OB History     Gravida  3   Para  1   Term  0   Preterm  1   AB  1   Living  1      SAB  0   IAB  1   Ectopic  0   Multiple  0   Live Births  1               Current Medications: Current Meds  Medication Sig   furosemide (LASIX) 20 MG tablet Take 1 tablet (20 mg total) by mouth daily.   NIFEdipine (ADALAT CC) 60 MG 24 hr tablet Take 1 tablet (60 mg total) by mouth daily. Can increase to twice a day as needed for symptomatic contractions   potassium chloride (KLOR-CON) 10 MEQ tablet Take 1 tablet (10 mEq total) by mouth daily.   Prenatal 28-0.8 MG TABS Take 1 tablet by mouth daily.   valACYclovir (VALTREX) 1000 MG tablet Take 1 tablet (1,000 mg total) by mouth daily.     Allergies:   Patient has no known allergies.   Social History   Socioeconomic History   Marital status: Single    Spouse  name: Not on file   Number of children: Not on file   Years of education: Not on file   Highest education level: Not on file  Occupational History   Not on file  Tobacco Use   Smoking status: Former    Packs/day: 0.25    Types: Cigarettes    Quit date: 04/09/2011    Years since quitting: 11.1    Passive exposure: Current   Smokeless tobacco: Never  Vaping Use   Vaping Use: Never used  Substance and Sexual Activity   Alcohol use: No   Drug use: Not Currently    Types: Marijuana    Comment: daily   Sexual activity: Not Currently    Partners: Male    Birth control/protection: None  Other Topics Concern   Not on file  Social History Narrative   Not on file   Social Determinants of Health   Financial Resource Strain: Not on file  Food Insecurity: Not on file  Transportation Needs: Not on file  Physical Activity: Not on file  Stress: Not  on file  Social Connections: Not on file      Family History  Problem Relation Age of Onset   Asthma Mother    Hypertension Mother    Diabetes Mother    Hypertension Maternal Grandmother    Diabetes Maternal Grandfather    Cancer Neg Hx    Heart disease Neg Hx       ROS:   Please see the history of present illness.     All other systems reviewed and are negative.   Labs/EKG Reviewed:    EKG:   EKG is was ordered today.  The ekg ordered today demonstrates normal sinus rhythm. HR 82 bpm  Recent Labs: 01/15/2022: TSH 0.674 06/13/2022: ALT 21; BUN 7; Creatinine, Ser 0.49; Hemoglobin 11.8; Platelets 247; Potassium 2.9; Sodium 137   Recent Lipid Panel No results found for: "CHOL", "TRIG", "HDL", "CHOLHDL", "LDLCALC", "LDLDIRECT"  Physical Exam:    VS:  BP (!) 152/94   Pulse 82   Ht 5\' 3"  (1.6 m)   Wt 66.9 kg   LMP 10/01/2021   SpO2 99%   BMI 26.13 kg/m     Wt Readings from Last 3 Encounters:  06/14/22 66.9 kg  06/12/22 64.4 kg  06/05/22 62.6 kg     GEN:  Well nourished, well developed in no acute  distress HEENT: Normal NECK: No JVD; No carotid bruits LYMPHATICS: No lymphadenopathy CARDIAC: RRR, +3 holosystolic murmurs, rubs, gallops RESPIRATORY:  Clear to auscultation without rales, wheezing or rhonchi  ABDOMEN: Soft, non-tender, non-distended MUSCULOSKELETAL:  No edema; No deformity  SKIN: Warm and dry NEUROLOGIC:  Alert and oriented x 3 PSYCHIATRIC:  Normal affect    Risk Assessment/Risk Calculators:     CARPREG II Risk Prediction Index Score:  1.  The patient's risk for a primary cardiac event is 5%.            ASSESSMENT & PLAN:    Dyspnea on Exertion  Mild Pre-eclampsia  Hypertension  Leg edema   She is hypertensive in the office today with significant leg edema. Will give cautious diuretics.  Lasix 20mg  x3 day with potassium supplement , expect improvement of her blood pressure but will monitor her closely. I will bring her back in 1 week for blood pressure check.  Echo will be ordered 1 week follow up   Patient Instructions  Medication Instructions:  Your physician has recommended you make the following change in your medication:  START: Lasix 20 mg daily START:  Potassium 10 mEq daily *If you need a refill on your cardiac medications before your next appointment, please call your pharmacy*   Lab Work: None If you have labs (blood work) drawn today and your tests are completely normal, you will receive your results only by: MyChart Message (if you have MyChart) OR A paper copy in the mail If you have any lab test that is abnormal or we need to change your treatment, we will call you to review the results.   Testing/Procedures: Your physician has requested that you have an echocardiogram. Echocardiography is a painless test that uses sound waves to create images of your heart. It provides your doctor with information about the size and shape of your heart and how well your heart's chambers and valves are working. This procedure takes approximately one  hour. There are no restrictions for this procedure. Please do NOT wear cologne, perfume, aftershave, or lotions (deodorant is allowed). Please arrive 15 minutes prior to your appointment time.   Follow-Up: At  [facility], you and your health needs are our priority.  As part of our continuing mission to provide you with exceptional heart care, we have created designated Provider Care Teams.  These Care Teams include your primary Cardiologist (physician) and Advanced Practice Providers (APPs -  Physician Assistants and Nurse Practitioners) who all work together to provide you with the care you need, when you need it.  We recommend signing up for the patient portal called "MyChart".  Sign up information is provided on this After Visit Summary.  MyChart is used to connect with patients for Virtual Visits (Telemedicine).  Patients are able to view lab/test results, encounter notes, upcoming appointments, etc.  Non-urgent messages can be sent to your provider as well.   To learn more about what you can do with MyChart, go to [email or web address].    Your next appointment:   1 week(s)  Provider:   Loistine Simas [facility] [address] [place], [place] [address]   Other Instructions     Dispo:  No follow-ups on file.   Medication Adjustments/Labs and Tests Ordered: Current medicines are reviewed at length with the patient today.  Concerns regarding medicines are outlined above.  Tests Ordered: Orders Placed This Encounter  Procedures   EKG 12-Lead   ECHOCARDIOGRAM COMPLETE   Medication Changes: Meds ordered this encounter  Medications   furosemide (LASIX) 20 MG tablet    Sig: Take 1 tablet (20 mg total) by mouth daily.    Dispense:  3 tablet    Refill:  0   potassium chloride (KLOR-CON) 10 MEQ tablet    Sig: Take 1 tablet (10 mEq total) by mouth daily.    Dispense:  3 tablet    Refill:  0

## 2022-06-17 ENCOUNTER — Encounter (HOSPITAL_COMMUNITY): Payer: Self-pay

## 2022-06-17 ENCOUNTER — Ambulatory Visit: Payer: Medicaid Other | Admitting: *Deleted

## 2022-06-17 ENCOUNTER — Ambulatory Visit: Payer: Medicaid Other | Attending: Obstetrics and Gynecology

## 2022-06-17 ENCOUNTER — Other Ambulatory Visit: Payer: Self-pay | Admitting: Obstetrics and Gynecology

## 2022-06-17 ENCOUNTER — Other Ambulatory Visit: Payer: Self-pay | Admitting: *Deleted

## 2022-06-17 VITALS — BP 143/85 | HR 87

## 2022-06-17 DIAGNOSIS — O09213 Supervision of pregnancy with history of pre-term labor, third trimester: Secondary | ICD-10-CM

## 2022-06-17 DIAGNOSIS — O365930 Maternal care for other known or suspected poor fetal growth, third trimester, not applicable or unspecified: Secondary | ICD-10-CM

## 2022-06-17 DIAGNOSIS — R8271 Bacteriuria: Secondary | ICD-10-CM | POA: Diagnosis present

## 2022-06-17 DIAGNOSIS — O1403 Mild to moderate pre-eclampsia, third trimester: Secondary | ICD-10-CM

## 2022-06-17 DIAGNOSIS — O1490 Unspecified pre-eclampsia, unspecified trimester: Secondary | ICD-10-CM | POA: Insufficient documentation

## 2022-06-17 DIAGNOSIS — O09293 Supervision of pregnancy with other poor reproductive or obstetric history, third trimester: Secondary | ICD-10-CM | POA: Insufficient documentation

## 2022-06-17 DIAGNOSIS — N856 Intrauterine synechiae: Secondary | ICD-10-CM | POA: Diagnosis not present

## 2022-06-17 DIAGNOSIS — O281 Abnormal biochemical finding on antenatal screening of mother: Secondary | ICD-10-CM

## 2022-06-17 DIAGNOSIS — O09899 Supervision of other high risk pregnancies, unspecified trimester: Secondary | ICD-10-CM | POA: Diagnosis present

## 2022-06-17 DIAGNOSIS — O35BXX0 Maternal care for other (suspected) fetal abnormality and damage, fetal cardiac anomalies, not applicable or unspecified: Secondary | ICD-10-CM

## 2022-06-17 DIAGNOSIS — O34593 Maternal care for other abnormalities of gravid uterus, third trimester: Secondary | ICD-10-CM

## 2022-06-17 DIAGNOSIS — R772 Abnormality of alphafetoprotein: Secondary | ICD-10-CM

## 2022-06-17 DIAGNOSIS — O10913 Unspecified pre-existing hypertension complicating pregnancy, third trimester: Secondary | ICD-10-CM

## 2022-06-17 DIAGNOSIS — Z3A34 34 weeks gestation of pregnancy: Secondary | ICD-10-CM

## 2022-06-19 ENCOUNTER — Ambulatory Visit (INDEPENDENT_AMBULATORY_CARE_PROVIDER_SITE_OTHER): Payer: Medicaid Other | Admitting: Family Medicine

## 2022-06-19 ENCOUNTER — Other Ambulatory Visit: Payer: Medicaid Other

## 2022-06-19 VITALS — BP 151/88 | HR 80 | Wt 146.0 lb

## 2022-06-19 DIAGNOSIS — O0993 Supervision of high risk pregnancy, unspecified, third trimester: Secondary | ICD-10-CM

## 2022-06-19 DIAGNOSIS — O365931 Maternal care for other known or suspected poor fetal growth, third trimester, fetus 1: Secondary | ICD-10-CM

## 2022-06-19 DIAGNOSIS — R8271 Bacteriuria: Secondary | ICD-10-CM

## 2022-06-19 DIAGNOSIS — O1403 Mild to moderate pre-eclampsia, third trimester: Secondary | ICD-10-CM

## 2022-06-19 DIAGNOSIS — O365990 Maternal care for other known or suspected poor fetal growth, unspecified trimester, not applicable or unspecified: Secondary | ICD-10-CM

## 2022-06-19 DIAGNOSIS — Z3A34 34 weeks gestation of pregnancy: Secondary | ICD-10-CM

## 2022-06-19 DIAGNOSIS — Z8619 Personal history of other infectious and parasitic diseases: Secondary | ICD-10-CM

## 2022-06-19 DIAGNOSIS — R772 Abnormality of alphafetoprotein: Secondary | ICD-10-CM

## 2022-06-19 NOTE — Progress Notes (Signed)
   PRENATAL VISIT NOTE  Subjective:  FARHA DANO is a 32 y.o. [phone] at [redacted]w[redacted]d being seen today for ongoing prenatal care.  She is currently monitored for the following issues for this high-risk pregnancy and has GBS bacteriuria; Supervision of high risk pregnancy in third trimester; Marijuana abuse; Nausea and vomiting during pregnancy prior to [redacted] weeks gestation; History of herpes genitalis; History of preterm delivery, currently pregnant; Cannabinoid hyperemesis syndrome; Abnormal alpha fetoprotein (AFP) level; Mild pre-eclampsia; and Uterine synechiae on their problem list.  Patient reports no complaints.  Contractions: Irritability. Vag. Bleeding: None.  Movement: Present. Denies leaking of fluid.   The following portions of the patient's history were reviewed and updated as appropriate: allergies, current medications, past family history, past medical history, past social history, past surgical history and problem list.   Objective:   Vitals:   06/19/22 0814  BP: (!) 151/88  Pulse: 80  Weight: 146 lb (66.2 kg)    Fetal Status: Fetal Heart Rate (bpm): 140 Fundal Height: 31 cm Movement: Present  Presentation: Vertex  General:  Alert, oriented and cooperative. Patient is in no acute distress.  Skin: Skin is warm and dry. No rash noted.   Cardiovascular: Normal heart rate noted  Respiratory: Normal respiratory effort, no problems with respiration noted  Abdomen: Soft, gravid, appropriate for gestational age.  Pain/Pressure: Absent     Pelvic: Cervical exam deferred        Extremities: Normal range of motion.  Edema: None  Mental Status: Normal mood and affect. Normal behavior. Normal judgment and thought content.   Assessment and Plan:  Pregnancy: [id number] at [redacted]w[redacted]d 1. Mild pre-eclampsia in third trimester BP is ok--but up--denies s/sx's of Pre-E--knows that with symptoms or abnl labs will need delivery sooner than 37 weeks as is the plan for now. - Protein / creatinine ratio,  urine - CBC - Comprehensive metabolic panel  2. Supervision of high risk pregnancy in third trimester   3. History of herpes genitalis On Valtrex  4. GBS bacteriuria Will need treatment in labor  5. Abnormal alpha fetoprotein (AFP) level 2.83 MOM Likely explains FGR and Pre-E  6. FGR In testing with weekly dopplers per MFM  Preterm labor symptoms and general obstetric precautions including but not limited to vaginal bleeding, contractions, leaking of fluid and fetal movement were reviewed in detail with the patient. Please refer to After Visit Summary for other counseling recommendations.   Return in 1 week (on 06/26/2022).  Future Appointments  Date Time Provider [place]  06/20/2022  4:05 PM [facility]CV [facility] ECHO 5 [id number]  06/21/2022  3:00 PM Berniece Salines, DO [facility] None  06/25/2022 11:15 AM [facility]-MFC NURSE [facility]  06/25/2022 11:30 AM [facility]-MFC US2 [facility]  06/26/2022  9:15 AM Donnamae Jude, MD [facility]  07/01/2022  8:30 AM W[facility]-MFC NURSE [facility]  07/01/2022  8:45 AM W[facility]-MFC US6 [facility]  07/03/2022  9:15 AM Aletha Halim, MD [facility]  07/05/2022  6:30 AM MC-LD SCHED ROOM MC-INDC None    Donnamae Jude, MD

## 2022-06-20 ENCOUNTER — Ambulatory Visit (HOSPITAL_COMMUNITY): Payer: Medicaid Other | Attending: Cardiology

## 2022-06-20 ENCOUNTER — Encounter: Payer: Medicaid Other | Admitting: Advanced Practice Midwife

## 2022-06-20 DIAGNOSIS — I517 Cardiomegaly: Secondary | ICD-10-CM | POA: Diagnosis not present

## 2022-06-20 DIAGNOSIS — R0609 Other forms of dyspnea: Secondary | ICD-10-CM

## 2022-06-20 DIAGNOSIS — I361 Nonrheumatic tricuspid (valve) insufficiency: Secondary | ICD-10-CM

## 2022-06-20 LAB — PROTEIN / CREATININE RATIO, URINE
Creatinine, Urine: 69.1 mg/dL
Protein, Ur: 14.5 mg/dL
Protein/Creat Ratio: 210 mg/g creat — ABNORMAL HIGH (ref 0–200)

## 2022-06-20 LAB — ECHOCARDIOGRAM COMPLETE
Area-P 1/2: 3.88 cm2
S' Lateral: 2.6 cm

## 2022-06-20 LAB — COMPREHENSIVE METABOLIC PANEL
ALT: 19 IU/L (ref 0–32)
AST: 20 IU/L (ref 0–40)
Albumin/Globulin Ratio: 1.4 (ref 1.2–2.2)
Albumin: 3.3 g/dL — ABNORMAL LOW (ref 3.9–4.9)
Alkaline Phosphatase: 124 IU/L — ABNORMAL HIGH (ref 44–121)
BUN/Creatinine Ratio: 13 (ref 9–23)
BUN: 6 mg/dL (ref 6–20)
Bilirubin Total: <0.2 mg/dL (ref 0.0–1.2)
CO2: 21 mmol/L (ref 20–29)
Calcium: 8.5 mg/dL — ABNORMAL LOW (ref 8.7–10.2)
Chloride: 104 mmol/L (ref 96–106)
Creatinine, Ser: 0.45 mg/dL — ABNORMAL LOW (ref 0.57–1.00)
Globulin, Total: 2.4 g/dL (ref 1.5–4.5)
Glucose: 92 mg/dL (ref 70–99)
Potassium: 3.5 mmol/L (ref 3.5–5.2)
Sodium: 139 mmol/L (ref 134–144)
Total Protein: 5.7 g/dL — ABNORMAL LOW (ref 6.0–8.5)
eGFR: 130 mL/min/{1.73_m2} (ref >59)

## 2022-06-20 LAB — CBC
Hematocrit: 34.4 % (ref 34.0–46.6)
Hemoglobin: 11.4 g/dL (ref 11.1–15.9)
MCH: 30.9 pg (ref 26.6–33.0)
MCHC: 33.1 g/dL (ref 31.5–35.7)
MCV: 93 fL (ref 79–97)
Platelets: 218 10*3/uL (ref 150–450)
RBC: 3.69 x10E6/uL — ABNORMAL LOW (ref 3.77–5.28)
RDW: 12.4 % (ref 11.7–15.4)
WBC: 7 10*3/uL (ref 3.4–10.8)

## 2022-06-21 ENCOUNTER — Encounter: Payer: Self-pay | Admitting: Cardiology

## 2022-06-21 ENCOUNTER — Ambulatory Visit (INDEPENDENT_AMBULATORY_CARE_PROVIDER_SITE_OTHER): Payer: Medicaid Other | Admitting: Cardiology

## 2022-06-21 ENCOUNTER — Encounter: Payer: Self-pay | Admitting: Advanced Practice Midwife

## 2022-06-21 ENCOUNTER — Other Ambulatory Visit (HOSPITAL_COMMUNITY): Payer: Self-pay | Admitting: Advanced Practice Midwife

## 2022-06-21 VITALS — BP 130/90 | HR 85 | Ht 63.0 in | Wt 146.7 lb

## 2022-06-21 DIAGNOSIS — O10919 Unspecified pre-existing hypertension complicating pregnancy, unspecified trimester: Secondary | ICD-10-CM

## 2022-06-21 DIAGNOSIS — Z3A34 34 weeks gestation of pregnancy: Secondary | ICD-10-CM

## 2022-06-21 DIAGNOSIS — O365990 Maternal care for other known or suspected poor fetal growth, unspecified trimester, not applicable or unspecified: Secondary | ICD-10-CM | POA: Insufficient documentation

## 2022-06-21 NOTE — Addendum Note (Signed)
Addended by: Donnamae Jude on: 06/21/2022 07:50 AM   Modules accepted: Orders

## 2022-06-21 NOTE — Patient Instructions (Signed)
Medication Instructions:  No Changes *If you need a refill on your cardiac medications before your next appointment, please call your pharmacy*  Lab Work: None Ordered If you have labs (blood work) drawn today and your tests are completely normal, you will receive your results only by: [place] (if you have MyChart) OR A paper copy in the mail  Testing/Procedures: None Ordered  Follow-Up: At [facility], you and your health needs are our priority.  As part of our continuing mission to provide you with exceptional heart care, we have created designated Provider Care Teams.  These Care Teams include your primary Cardiologist (physician) and Advanced Practice Providers (APPs -  Physician Assistants and Nurse Practitioners) who all work together to provide you with the care you need, when you need it.  Your next appointment:   12 week(s) POSTPARTUM  Provider:   Berniece Salines, DO    Other Instructions Record blood pressures for 1 WEEK send readings via MyChart. Hoping for a safe delivery!

## 2022-06-21 NOTE — Progress Notes (Unsigned)
Cardio-Obstetrics Clinic  Follow Up Note   Date:  06/22/2022   ID:  Traci King, DOB 27-Jul-1990, MRN [id number]  PCP:  Aviva Kluver   [facility] Providers Cardiologist:  Thomasene Ripple, DO  Electrophysiologist:  None        Referring MD: No ref. provider found   Chief Complaint: " I am doing much better"  History of Present Illness:    Traci King is a 32 y.o. female [[id number]] who returns for follow up of hypertensin in pregnancy.   Medical history includes hypertension in pregnancy, Mild pre-eclampsia , hx of marijuana abuse, and hx of herpes genitalis.  At her last visit she was short of breath, hypertensive with significant leg edema. I give cautious diuretics.   Prior CV Studies Reviewed: The following studies were reviewed today: Tte 2022/07/20 IMPRESSIONS     1. Left ventricular ejection fraction, by estimation, is 60 to 65%. The  left ventricle has normal function. The left ventricle has no regional  wall motion abnormalities. Left ventricular diastolic parameters were  normal. The average left ventricular  global longitudinal strain is -22.8 %. The global longitudinal strain is  normal.   2. Right ventricular systolic function is normal. The right ventricular  size is normal. There is normal pulmonary artery systolic pressure. The  estimated right ventricular systolic pressure is 28.6 mmHg.   3. Right atrial size was mild to moderately dilated.   4. The mitral valve is normal in structure. Trivial mitral valve  regurgitation. No evidence of mitral stenosis.   5. The aortic valve is normal in structure. Aortic valve regurgitation is  not visualized. No aortic stenosis is present.   6. The inferior vena cava is normal in size with greater than 50%  respiratory variability, suggesting right atrial pressure of 3 mmHg.   FINDINGS   Left Ventricle: Left ventricular ejection fraction, by estimation, is 60  to 65%. The left ventricle has normal  function. The left ventricle has no  regional wall motion abnormalities. The average left ventricular global  longitudinal strain is -22.8 %.  The global longitudinal strain is normal. The left ventricular internal  cavity size was normal in size. There is no left ventricular hypertrophy.  Left ventricular diastolic parameters were normal. Normal left ventricular  filling pressure.   Right Ventricle: The right ventricular size is normal. No increase in  right ventricular wall thickness. Right ventricular systolic function is  normal. There is normal pulmonary artery systolic pressure. The tricuspid  regurgitant velocity is 2.53 m/s, and   with an assumed right atrial pressure of 3 mmHg, the estimated right  ventricular systolic pressure is 28.6 mmHg.   Left Atrium: Left atrial size was normal in size.   Right Atrium: Right atrial size was mild to moderately dilated.   Pericardium: There is no evidence of pericardial effusion.   Mitral Valve: The mitral valve is normal in structure. Trivial mitral  valve regurgitation. No evidence of mitral valve stenosis.   Tricuspid Valve: The tricuspid valve is normal in structure. Tricuspid  valve regurgitation is mild . No evidence of tricuspid stenosis.   Aortic Valve: The aortic valve is normal in structure. Aortic valve  regurgitation is not visualized. No aortic stenosis is present.   Pulmonic Valve: The pulmonic valve was normal in structure. Pulmonic valve  regurgitation is trivial. No evidence of pulmonic stenosis.   Aorta: The aortic root is normal in size and structure.   Venous: The inferior vena cava  is normal in size with greater than 50%  respiratory variability, suggesting right atrial pressure of 3 mmHg.   IAS/Shunts: No atrial level shunt detected by color flow Doppler.       Past Medical History:  Diagnosis Date   Abnormal Pap smear    Herpes simplex type 1 infection    History of stomach ulcers    Preeclampsia      Past Surgical History:  Procedure Laterality Date   TOOTH EXTRACTION        OB History     Gravida  3   Para  1   Term  0   Preterm  1   AB  1   Living  1      SAB  0   IAB  1   Ectopic  0   Multiple  0   Live Births  1               Current Medications: Current Meds  Medication Sig   furosemide (LASIX) 20 MG tablet Take 1 tablet (20 mg total) by mouth daily.   NIFEdipine (ADALAT CC) 60 MG 24 hr tablet Take 1 tablet (60 mg total) by mouth daily. Can increase to twice a day as needed for symptomatic contractions   potassium chloride (KLOR-CON) 10 MEQ tablet Take 1 tablet (10 mEq total) by mouth daily.   Prenatal 28-0.8 MG TABS Take 1 tablet by mouth daily.   valACYclovir (VALTREX) 1000 MG tablet Take 1 tablet (1,000 mg total) by mouth daily.     Allergies:   Patient has no known allergies.   Social History   Socioeconomic History   Marital status: Single    Spouse name: Not on file   Number of children: Not on file   Years of education: Not on file   Highest education level: Not on file  Occupational History   Not on file  Tobacco Use   Smoking status: Former    Packs/day: 0.25    Types: Cigarettes    Quit date: 04/09/2011    Years since quitting: 11.2    Passive exposure: Current   Smokeless tobacco: Never  Vaping Use   Vaping Use: Never used  Substance and Sexual Activity   Alcohol use: No   Drug use: Not Currently    Types: Marijuana    Comment: daily   Sexual activity: Not Currently    Partners: Male    Birth control/protection: None  Other Topics Concern   Not on file  Social History Narrative   Not on file   Social Determinants of Health   Financial Resource Strain: Not on file  Food Insecurity: Not on file  Transportation Needs: Not on file  Physical Activity: Not on file  Stress: Not on file  Social Connections: Not on file      Family History  Problem Relation Age of Onset   Asthma Mother    Hypertension  Mother    Diabetes Mother    Hypertension Maternal Grandmother    Diabetes Maternal Grandfather    Cancer Neg Hx    Heart disease Neg Hx       ROS:   Please see the history of present illness.     All other systems reviewed and are negative.   Labs/EKG Reviewed:    EKG:   EKG is was not  ordered today.    Recent Labs: 01/15/2022: TSH 0.674 06/19/2022: ALT 19; BUN 6; Creatinine, Ser 0.45; Hemoglobin 11.4; Platelets  218; Potassium 3.5; Sodium 139   Recent Lipid Panel No results found for: "CHOL", "TRIG", "HDL", "CHOLHDL", "LDLCALC", "LDLDIRECT"  Physical Exam:    VS:  BP (!) 130/90   Pulse 85   Ht 5\' 3"  (1.6 m)   Wt 66.5 kg   LMP 10/01/2021   SpO2 97%   BMI 25.99 kg/m     Wt Readings from Last 3 Encounters:  06/21/22 66.5 kg  06/19/22 66.2 kg  06/14/22 66.9 kg     GEN:  Well nourished, well developed in no acute distress HEENT: Normal NECK: No JVD; No carotid bruits LYMPHATICS: No lymphadenopathy CARDIAC: RRR, no murmurs, rubs, gallops RESPIRATORY:  Clear to auscultation without rales, wheezing or rhonchi  ABDOMEN: Soft, non-tender, non-distended MUSCULOSKELETAL:  No edema; No deformity  SKIN: Warm and dry NEUROLOGIC:  Alert and oriented x 3 PSYCHIATRIC:  Normal affect    Risk Assessment/Risk Calculators:     CARPREG II Risk Prediction Index Score:  1.  The patient's risk for a primary cardiac event is 5%.            ASSESSMENT & PLAN:    Shortness of breath  Hypertension in pregnancy   Her shortness of breath and leg swelling has improved - after 3 days of Lasix. No need for further duiretics at this time.   Blood pressure also improved. Ask the patient take her blood pressure daily. Will send me updated bp in 1 week. Educated the patient if she has bp >140/90 to notifiy my office.    Patient Instructions  Medication Instructions:  No Changes *If you need a refill on your cardiac medications before your next appointment, please call your  pharmacy*  Lab Work: None Ordered If you have labs (blood work) drawn today and your tests are completely normal, you will receive your results only by: [place] (if you have MyChart) OR A paper copy in the mail  Testing/Procedures: None Ordered  Follow-Up: At [facility], you and your health needs are our priority.  As part of our continuing mission to provide you with exceptional heart care, we have created designated Provider Care Teams.  These Care Teams include your primary Cardiologist (physician) and Advanced Practice Providers (APPs -  Physician Assistants and Nurse Practitioners) who all work together to provide you with the care you need, when you need it.  Your next appointment:   12 week(s) POSTPARTUM  Provider:   Berniece Salines, DO    Other Instructions Record blood pressures for 1 WEEK send readings via MyChart. Hoping for a safe delivery!    Dispo:  No follow-ups on file.   Medication Adjustments/Labs and Tests Ordered: Current medicines are reviewed at length with the patient today.  Concerns regarding medicines are outlined above.  Tests Ordered: No orders of the defined types were placed in this encounter.  Medication Changes: No orders of the defined types were placed in this encounter.

## 2022-06-25 ENCOUNTER — Other Ambulatory Visit: Payer: Self-pay | Admitting: Obstetrics

## 2022-06-25 ENCOUNTER — Ambulatory Visit (HOSPITAL_BASED_OUTPATIENT_CLINIC_OR_DEPARTMENT_OTHER): Payer: Medicaid Other

## 2022-06-25 ENCOUNTER — Encounter: Payer: Self-pay | Admitting: *Deleted

## 2022-06-25 ENCOUNTER — Ambulatory Visit: Payer: Medicaid Other

## 2022-06-25 ENCOUNTER — Ambulatory Visit: Payer: Medicaid Other | Attending: Obstetrics | Admitting: *Deleted

## 2022-06-25 VITALS — BP 127/68 | HR 102

## 2022-06-25 DIAGNOSIS — O365930 Maternal care for other known or suspected poor fetal growth, third trimester, not applicable or unspecified: Secondary | ICD-10-CM | POA: Diagnosis present

## 2022-06-25 DIAGNOSIS — O09293 Supervision of pregnancy with other poor reproductive or obstetric history, third trimester: Secondary | ICD-10-CM | POA: Insufficient documentation

## 2022-06-25 DIAGNOSIS — O1403 Mild to moderate pre-eclampsia, third trimester: Secondary | ICD-10-CM | POA: Insufficient documentation

## 2022-06-25 DIAGNOSIS — O09213 Supervision of pregnancy with history of pre-term labor, third trimester: Secondary | ICD-10-CM

## 2022-06-25 DIAGNOSIS — R8271 Bacteriuria: Secondary | ICD-10-CM

## 2022-06-25 DIAGNOSIS — O281 Abnormal biochemical finding on antenatal screening of mother: Secondary | ICD-10-CM | POA: Diagnosis not present

## 2022-06-25 DIAGNOSIS — Z8759 Personal history of other complications of pregnancy, childbirth and the puerperium: Secondary | ICD-10-CM | POA: Insufficient documentation

## 2022-06-25 DIAGNOSIS — O10913 Unspecified pre-existing hypertension complicating pregnancy, third trimester: Secondary | ICD-10-CM

## 2022-06-25 DIAGNOSIS — N856 Intrauterine synechiae: Secondary | ICD-10-CM

## 2022-06-25 DIAGNOSIS — Z3A35 35 weeks gestation of pregnancy: Secondary | ICD-10-CM | POA: Insufficient documentation

## 2022-06-25 DIAGNOSIS — O365990 Maternal care for other known or suspected poor fetal growth, unspecified trimester, not applicable or unspecified: Secondary | ICD-10-CM

## 2022-06-25 DIAGNOSIS — O09893 Supervision of other high risk pregnancies, third trimester: Secondary | ICD-10-CM | POA: Insufficient documentation

## 2022-06-25 DIAGNOSIS — R772 Abnormality of alphafetoprotein: Secondary | ICD-10-CM

## 2022-06-25 DIAGNOSIS — O26893 Other specified pregnancy related conditions, third trimester: Secondary | ICD-10-CM | POA: Diagnosis not present

## 2022-06-25 DIAGNOSIS — O34593 Maternal care for other abnormalities of gravid uterus, third trimester: Secondary | ICD-10-CM

## 2022-06-25 DIAGNOSIS — O35BXX0 Maternal care for other (suspected) fetal abnormality and damage, fetal cardiac anomalies, not applicable or unspecified: Secondary | ICD-10-CM | POA: Diagnosis not present

## 2022-06-25 DIAGNOSIS — O09899 Supervision of other high risk pregnancies, unspecified trimester: Secondary | ICD-10-CM

## 2022-06-25 NOTE — Procedures (Signed)
Traci King [birth year] [redacted]w[redacted]d  Fetus A Non-Stress Test Interpretation for 06/25/22  Indication: IUGR  Fetal Heart Rate A Mode: External Baseline Rate (A): 165 bpm Variability: Moderate Accelerations: 15 x 15 Decelerations: None Multiple birth?: No  Uterine Activity Mode: Palpation, Toco Contraction Frequency (min): rare Contraction Quality: Mild Resting Tone Palpated: Relaxed  Interpretation (Fetal Testing) Nonstress Test Interpretation: Reactive Overall Impression: Reassuring for gestational age Comments: Dr Gertie Exon reviewed tracing

## 2022-06-26 ENCOUNTER — Other Ambulatory Visit: Payer: Medicaid Other

## 2022-06-26 ENCOUNTER — Other Ambulatory Visit (HOSPITAL_COMMUNITY)
Admission: RE | Admit: 2022-06-26 | Discharge: 2022-06-26 | Disposition: A | Discharge status: Home / Self Care | Payer: Medicaid Other | Source: Ambulatory Visit | Attending: Family Medicine | Admitting: Family Medicine

## 2022-06-26 ENCOUNTER — Ambulatory Visit (INDEPENDENT_AMBULATORY_CARE_PROVIDER_SITE_OTHER): Payer: Medicaid Other | Admitting: Family Medicine

## 2022-06-26 VITALS — BP 135/78 | HR 76 | Wt 144.2 lb

## 2022-06-26 DIAGNOSIS — Z8619 Personal history of other infectious and parasitic diseases: Secondary | ICD-10-CM

## 2022-06-26 DIAGNOSIS — O0993 Supervision of high risk pregnancy, unspecified, third trimester: Secondary | ICD-10-CM | POA: Insufficient documentation

## 2022-06-26 DIAGNOSIS — R772 Abnormality of alphafetoprotein: Secondary | ICD-10-CM

## 2022-06-26 DIAGNOSIS — Z3A35 35 weeks gestation of pregnancy: Secondary | ICD-10-CM

## 2022-06-26 DIAGNOSIS — O365990 Maternal care for other known or suspected poor fetal growth, unspecified trimester, not applicable or unspecified: Secondary | ICD-10-CM

## 2022-06-26 DIAGNOSIS — O365131 Maternal care for known or suspected placental insufficiency, third trimester, fetus 1: Secondary | ICD-10-CM

## 2022-06-26 DIAGNOSIS — O1403 Mild to moderate pre-eclampsia, third trimester: Secondary | ICD-10-CM

## 2022-06-26 DIAGNOSIS — R8271 Bacteriuria: Secondary | ICD-10-CM

## 2022-06-26 NOTE — Progress Notes (Signed)
   PRENATAL VISIT NOTE  Subjective:  Traci King is a 32 y.o. [phone] at [redacted]w[redacted]d being seen today for ongoing prenatal care.  She is currently monitored for the following issues for this high-risk pregnancy and has GBS bacteriuria; Supervision of high risk pregnancy in third trimester; Marijuana abuse; Nausea and vomiting during pregnancy prior to [redacted] weeks gestation; History of herpes genitalis; History of preterm delivery, currently pregnant; Cannabinoid hyperemesis syndrome; Abnormal alpha fetoprotein (AFP) level; Mild pre-eclampsia; Uterine synechiae; and Fetal growth restriction antepartum on their problem list.  Patient reports no complaints.  Contractions: Irregular. Vag. Bleeding: None.  Movement: Present. Denies leaking of fluid.   The following portions of the patient's history were reviewed and updated as appropriate: allergies, current medications, past family history, past medical history, past social history, past surgical history and problem list.   Objective:   Vitals:   06/26/22 0925  BP: 135/78  Pulse: 76  Weight: 144 lb 3.2 oz (65.4 kg)    Fetal Status: Fetal Heart Rate (bpm): 165 Fundal Height: 32 cm Movement: Present     General:  Alert, oriented and cooperative. Patient is in no acute distress.  Skin: Skin is warm and dry. No rash noted.   Cardiovascular: Normal heart rate noted  Respiratory: Normal respiratory effort, no problems with respiration noted  Abdomen: Soft, gravid, appropriate for gestational age.  Pain/Pressure: Absent     Pelvic: Cervical exam performed in the presence of a chaperone        Extremities: Normal range of motion.  Edema: None  Mental Status: Normal mood and affect. Normal behavior. Normal judgment and thought content.   Assessment and Plan:  Pregnancy: [id number] at [redacted]w[redacted]d 1. Mild pre-eclampsia in third trimester Labs today Has FGR in testing with MFM IOL @ 37 weeks - Comprehensive metabolic panel - Protein / creatinine ratio,  urine - CBC  2. Supervision of high risk pregnancy in third trimester GBS positive already--will need treatment in labor - Cervicovaginal ancillary only  3. History of herpes genitalis On Valtrex  4. GBS bacteriuria Will need treatment in labor  5. Fetal growth restriction antepartum In testing with MFM  6. Abnormal alpha fetoprotein (AFP) level    Preterm labor symptoms and general obstetric precautions including but not limited to vaginal bleeding, contractions, leaking of fluid and fetal movement were reviewed in detail with the patient. Please refer to After Visit Summary for other counseling recommendations.   Return in 1 week (on 07/03/2022).  Future Appointments  Date Time Provider [place]  07/01/2022  8:30 AM [facility] NURSE [facility]  07/01/2022  8:45 AM [facility] US6 [facility]  07/03/2022  9:15 AM Aletha Halim, MD [facility]  07/05/2022  6:30 AM [facility] SCHED ROOM [facility] None  09/06/2022  2:40 PM Tobb, Godfrey Pick, DO [facility] None    Donnamae Jude, MD

## 2022-06-27 LAB — CERVICOVAGINAL ANCILLARY ONLY
Chlamydia: NEGATIVE
Comment: NEGATIVE
Comment: NORMAL
Neisseria Gonorrhea: NEGATIVE

## 2022-06-28 ENCOUNTER — Ambulatory Visit: Payer: Medicaid Other

## 2022-06-28 LAB — CBC
Hematocrit: 40.1 % (ref 34.0–46.6)
Hemoglobin: 13.4 g/dL (ref 11.1–15.9)
MCH: 30.7 pg (ref 26.6–33.0)
MCHC: 33.4 g/dL (ref 31.5–35.7)
MCV: 92 fL (ref 79–97)
Platelets: 277 10*3/uL (ref 150–450)
RBC: 4.36 x10E6/uL (ref 3.77–5.28)
RDW: 12.7 % (ref 11.7–15.4)
WBC: 6.9 10*3/uL (ref 3.4–10.8)

## 2022-06-28 LAB — COMPREHENSIVE METABOLIC PANEL
ALT: 19 IU/L (ref 0–32)
AST: 22 IU/L (ref 0–40)
Albumin/Globulin Ratio: 1.5 (ref 1.2–2.2)
Albumin: 3.9 g/dL (ref 3.9–4.9)
Alkaline Phosphatase: 158 IU/L — ABNORMAL HIGH (ref 44–121)
BUN/Creatinine Ratio: 12 (ref 9–23)
BUN: 6 mg/dL (ref 6–20)
Bilirubin Total: 0.3 mg/dL (ref 0.0–1.2)
CO2: 20 mmol/L (ref 20–29)
Calcium: 9.2 mg/dL (ref 8.7–10.2)
Chloride: 102 mmol/L (ref 96–106)
Creatinine, Ser: 0.5 mg/dL — ABNORMAL LOW (ref 0.57–1.00)
Globulin, Total: 2.6 g/dL (ref 1.5–4.5)
Glucose: 60 mg/dL — ABNORMAL LOW (ref 70–99)
Potassium: 3.2 mmol/L — ABNORMAL LOW (ref 3.5–5.2)
Sodium: 140 mmol/L (ref 134–144)
Total Protein: 6.5 g/dL (ref 6.0–8.5)
eGFR: 127 mL/min/{1.73_m2} (ref >59)

## 2022-06-28 LAB — PROTEIN / CREATININE RATIO, URINE
Creatinine, Urine: 14 mg/dL
Protein, Ur: 7.8 mg/dL
Protein/Creat Ratio: 557 mg/g creat — ABNORMAL HIGH (ref 0–200)

## 2022-07-01 ENCOUNTER — Ambulatory Visit: Payer: Medicaid Other | Admitting: *Deleted

## 2022-07-01 ENCOUNTER — Telehealth (HOSPITAL_COMMUNITY): Payer: Self-pay | Admitting: *Deleted

## 2022-07-01 ENCOUNTER — Other Ambulatory Visit (HOSPITAL_COMMUNITY): Payer: Self-pay | Admitting: Advanced Practice Midwife

## 2022-07-01 ENCOUNTER — Encounter (HOSPITAL_COMMUNITY): Payer: Self-pay

## 2022-07-01 ENCOUNTER — Ambulatory Visit: Payer: Medicaid Other | Attending: Obstetrics

## 2022-07-01 ENCOUNTER — Other Ambulatory Visit: Payer: Self-pay | Admitting: Obstetrics

## 2022-07-01 VITALS — BP 135/84 | HR 76

## 2022-07-01 DIAGNOSIS — O281 Abnormal biochemical finding on antenatal screening of mother: Secondary | ICD-10-CM

## 2022-07-01 DIAGNOSIS — R8271 Bacteriuria: Secondary | ICD-10-CM

## 2022-07-01 DIAGNOSIS — O365990 Maternal care for other known or suspected poor fetal growth, unspecified trimester, not applicable or unspecified: Secondary | ICD-10-CM | POA: Diagnosis present

## 2022-07-01 DIAGNOSIS — O35BXX0 Maternal care for other (suspected) fetal abnormality and damage, fetal cardiac anomalies, not applicable or unspecified: Secondary | ICD-10-CM

## 2022-07-01 DIAGNOSIS — O365930 Maternal care for other known or suspected poor fetal growth, third trimester, not applicable or unspecified: Secondary | ICD-10-CM | POA: Diagnosis present

## 2022-07-01 DIAGNOSIS — O1403 Mild to moderate pre-eclampsia, third trimester: Secondary | ICD-10-CM | POA: Diagnosis present

## 2022-07-01 DIAGNOSIS — O09213 Supervision of pregnancy with history of pre-term labor, third trimester: Secondary | ICD-10-CM

## 2022-07-01 DIAGNOSIS — O10913 Unspecified pre-existing hypertension complicating pregnancy, third trimester: Secondary | ICD-10-CM

## 2022-07-01 DIAGNOSIS — N856 Intrauterine synechiae: Secondary | ICD-10-CM

## 2022-07-01 DIAGNOSIS — R772 Abnormality of alphafetoprotein: Secondary | ICD-10-CM | POA: Insufficient documentation

## 2022-07-01 DIAGNOSIS — O09293 Supervision of pregnancy with other poor reproductive or obstetric history, third trimester: Secondary | ICD-10-CM

## 2022-07-01 DIAGNOSIS — O34593 Maternal care for other abnormalities of gravid uterus, third trimester: Secondary | ICD-10-CM

## 2022-07-01 DIAGNOSIS — Z3A36 36 weeks gestation of pregnancy: Secondary | ICD-10-CM

## 2022-07-01 NOTE — Telephone Encounter (Signed)
Preadmission screen  

## 2022-07-02 ENCOUNTER — Encounter (HOSPITAL_COMMUNITY): Payer: Self-pay | Admitting: *Deleted

## 2022-07-02 ENCOUNTER — Telehealth (HOSPITAL_COMMUNITY): Payer: Self-pay | Admitting: *Deleted

## 2022-07-02 NOTE — Telephone Encounter (Signed)
Preadmission screen  

## 2022-07-03 ENCOUNTER — Ambulatory Visit (INDEPENDENT_AMBULATORY_CARE_PROVIDER_SITE_OTHER): Payer: Medicaid Other | Admitting: Obstetrics and Gynecology

## 2022-07-03 ENCOUNTER — Other Ambulatory Visit: Payer: Self-pay | Admitting: Advanced Practice Midwife

## 2022-07-03 ENCOUNTER — Other Ambulatory Visit: Payer: Medicaid Other

## 2022-07-03 VITALS — BP 145/83 | HR 73 | Wt 143.8 lb

## 2022-07-03 DIAGNOSIS — Z3A36 36 weeks gestation of pregnancy: Secondary | ICD-10-CM

## 2022-07-03 DIAGNOSIS — Z8619 Personal history of other infectious and parasitic diseases: Secondary | ICD-10-CM

## 2022-07-03 DIAGNOSIS — O1403 Mild to moderate pre-eclampsia, third trimester: Secondary | ICD-10-CM

## 2022-07-03 DIAGNOSIS — O365930 Maternal care for other known or suspected poor fetal growth, third trimester, not applicable or unspecified: Secondary | ICD-10-CM

## 2022-07-03 DIAGNOSIS — R8271 Bacteriuria: Secondary | ICD-10-CM

## 2022-07-03 DIAGNOSIS — N856 Intrauterine synechiae: Secondary | ICD-10-CM

## 2022-07-03 DIAGNOSIS — O365990 Maternal care for other known or suspected poor fetal growth, unspecified trimester, not applicable or unspecified: Secondary | ICD-10-CM

## 2022-07-03 LAB — OB RESULTS CONSOLE GBS: GBS: POSITIVE

## 2022-07-03 NOTE — Progress Notes (Signed)
   PRENATAL VISIT NOTE  Subjective:  Traci King is a 32 y.o. [phone] at [redacted]w[redacted]d being seen today for ongoing prenatal care.  She is currently monitored for the following issues for this high-risk pregnancy and has GBS bacteriuria; Supervision of high risk pregnancy in third trimester; Marijuana abuse; Nausea and vomiting during pregnancy prior to [redacted] weeks gestation; History of herpes genitalis; History of preterm delivery, currently pregnant; Cannabinoid hyperemesis syndrome; Abnormal alpha fetoprotein (AFP) level; Mild pre-eclampsia; Uterine synechiae; and Fetal growth restriction antepartum on their problem list.  Patient reports no complaints.  Contractions: Irregular. Vag. Bleeding: None.  Movement: Present. Denies leaking of fluid.   The following portions of the patient's history were reviewed and updated as appropriate: allergies, current medications, past family history, past medical history, past social history, past surgical history and problem list.   Objective:   Vitals:   07/03/22 0825  BP: (!) 145/83  Pulse: 73  Weight: 143 lb 12.8 oz (65.2 kg)    Fetal Status: Fetal Heart Rate (bpm): 133   Movement: Present     General:  Alert, oriented and cooperative. Patient is in no acute distress.  Skin: Skin is warm and dry. No rash noted.   Cardiovascular: Normal heart rate noted  Respiratory: Normal respiratory effort, no problems with respiration noted  Abdomen: Soft, gravid, appropriate for gestational age.  Pain/Pressure: Absent     Pelvic: Cervical exam deferred        Extremities: Normal range of motion.  Edema: None  Mental Status: Normal mood and affect. Normal behavior. Normal judgment and thought content.   Assessment and Plan:  Pregnancy: [id number] at [redacted]w[redacted]d 1. Mild pre-eclampsia in third trimester Continue low dose ASA. Precautions given. Surveillance labs today. Pt set up for 2/9 IOL - CBC - Comprehensive metabolic panel  2. [redacted] weeks gestation of pregnancy -  CBC - Comprehensive metabolic panel  3. Fetal growth restriction antepartum 1/22: 2.6%, 1863g, ac 1.4%, afi 14, ceph, ua wnl 2/5: ceph, afi 5.6, bpp 10/10, ua wnl  4. GBS bacteriuria Tx in labor  5. Uterine synechiae Careful third stage management  6. History of herpes genitalis Continue valtrex ppx  Preterm labor symptoms and general obstetric precautions including but not limited to vaginal bleeding, contractions, leaking of fluid and fetal movement were reviewed in detail with the patient. Please refer to After Visit Summary for other counseling recommendations.   No follow-ups on file.  Future Appointments  Date Time Provider [place]  07/03/2022  9:15 AM Aletha Halim, MD [facility]  07/05/2022  6:30 AM [facility] [place] [facility] None  09/06/2022  2:40 PM Tobb, Godfrey Pick, DO [facility] None    Aletha Halim, MD

## 2022-07-03 NOTE — Progress Notes (Signed)
CC: routine OB  Doing well

## 2022-07-04 LAB — COMPREHENSIVE METABOLIC PANEL
ALT: 17 IU/L (ref 0–32)
AST: 21 IU/L (ref 0–40)
Albumin/Globulin Ratio: 1.6 (ref 1.2–2.2)
Albumin: 3.6 g/dL — ABNORMAL LOW (ref 3.9–4.9)
Alkaline Phosphatase: 157 IU/L — ABNORMAL HIGH (ref 44–121)
BUN/Creatinine Ratio: 11 (ref 9–23)
BUN: 6 mg/dL (ref 6–20)
Bilirubin Total: 0.3 mg/dL (ref 0.0–1.2)
CO2: 19 mmol/L — ABNORMAL LOW (ref 20–29)
Calcium: 8.6 mg/dL — ABNORMAL LOW (ref 8.7–10.2)
Chloride: 101 mmol/L (ref 96–106)
Creatinine, Ser: 0.53 mg/dL — ABNORMAL LOW (ref 0.57–1.00)
Globulin, Total: 2.3 g/dL (ref 1.5–4.5)
Glucose: 127 mg/dL — ABNORMAL HIGH (ref 70–99)
Potassium: 3 mmol/L — ABNORMAL LOW (ref 3.5–5.2)
Sodium: 137 mmol/L (ref 134–144)
Total Protein: 5.9 g/dL — ABNORMAL LOW (ref 6.0–8.5)
eGFR: 125 mL/min/{1.73_m2} (ref >59)

## 2022-07-04 LAB — CBC
Hematocrit: 36.1 % (ref 34.0–46.6)
Hemoglobin: 12.3 g/dL (ref 11.1–15.9)
MCH: 31.1 pg (ref 26.6–33.0)
MCHC: 34.1 g/dL (ref 31.5–35.7)
MCV: 91 fL (ref 79–97)
Platelets: 257 10*3/uL (ref 150–450)
RBC: 3.95 x10E6/uL (ref 3.77–5.28)
RDW: 12.8 % (ref 11.7–15.4)
WBC: 6.2 10*3/uL (ref 3.4–10.8)

## 2022-07-05 ENCOUNTER — Inpatient Hospital Stay (HOSPITAL_COMMUNITY): Payer: Medicaid Other | Admitting: Anesthesiology

## 2022-07-05 ENCOUNTER — Other Ambulatory Visit: Payer: Self-pay

## 2022-07-05 ENCOUNTER — Encounter (HOSPITAL_COMMUNITY): Payer: Self-pay | Admitting: Family Medicine

## 2022-07-05 ENCOUNTER — Inpatient Hospital Stay (HOSPITAL_COMMUNITY)
Admission: RE | Admit: 2022-07-05 | Discharge: 2022-07-08 | DRG: 797 | Disposition: A | Discharge status: Home / Self Care | Payer: Medicaid Other | Attending: Obstetrics and Gynecology | Admitting: Obstetrics and Gynecology

## 2022-07-05 ENCOUNTER — Inpatient Hospital Stay (HOSPITAL_COMMUNITY): Payer: Medicaid Other

## 2022-07-05 DIAGNOSIS — O09899 Supervision of other high risk pregnancies, unspecified trimester: Secondary | ICD-10-CM

## 2022-07-05 DIAGNOSIS — O99820 Streptococcus B carrier state complicating pregnancy: Secondary | ICD-10-CM | POA: Diagnosis not present

## 2022-07-05 DIAGNOSIS — Z302 Encounter for sterilization: Secondary | ICD-10-CM | POA: Diagnosis not present

## 2022-07-05 DIAGNOSIS — Z87891 Personal history of nicotine dependence: Secondary | ICD-10-CM

## 2022-07-05 DIAGNOSIS — O365930 Maternal care for other known or suspected poor fetal growth, third trimester, not applicable or unspecified: Principal | ICD-10-CM | POA: Diagnosis present

## 2022-07-05 DIAGNOSIS — O365990 Maternal care for other known or suspected poor fetal growth, unspecified trimester, not applicable or unspecified: Secondary | ICD-10-CM | POA: Diagnosis present

## 2022-07-05 DIAGNOSIS — O0993 Supervision of high risk pregnancy, unspecified, third trimester: Secondary | ICD-10-CM

## 2022-07-05 DIAGNOSIS — O9832 Other infections with a predominantly sexual mode of transmission complicating childbirth: Secondary | ICD-10-CM | POA: Diagnosis present

## 2022-07-05 DIAGNOSIS — O1414 Severe pre-eclampsia complicating childbirth: Secondary | ICD-10-CM | POA: Diagnosis present

## 2022-07-05 DIAGNOSIS — O99824 Streptococcus B carrier state complicating childbirth: Secondary | ICD-10-CM | POA: Diagnosis present

## 2022-07-05 DIAGNOSIS — A6000 Herpesviral infection of urogenital system, unspecified: Secondary | ICD-10-CM | POA: Diagnosis present

## 2022-07-05 DIAGNOSIS — Z8619 Personal history of other infectious and parasitic diseases: Secondary | ICD-10-CM | POA: Diagnosis present

## 2022-07-05 DIAGNOSIS — Z8711 Personal history of peptic ulcer disease: Secondary | ICD-10-CM

## 2022-07-05 DIAGNOSIS — R772 Abnormality of alphafetoprotein: Secondary | ICD-10-CM | POA: Diagnosis present

## 2022-07-05 DIAGNOSIS — O1403 Mild to moderate pre-eclampsia, third trimester: Secondary | ICD-10-CM

## 2022-07-05 DIAGNOSIS — Z3A37 37 weeks gestation of pregnancy: Secondary | ICD-10-CM

## 2022-07-05 DIAGNOSIS — O1404 Mild to moderate pre-eclampsia, complicating childbirth: Secondary | ICD-10-CM | POA: Diagnosis not present

## 2022-07-05 DIAGNOSIS — O1490 Unspecified pre-eclampsia, unspecified trimester: Secondary | ICD-10-CM | POA: Diagnosis present

## 2022-07-05 DIAGNOSIS — O99324 Drug use complicating childbirth: Secondary | ICD-10-CM | POA: Diagnosis not present

## 2022-07-05 DIAGNOSIS — O326XX0 Maternal care for compound presentation, not applicable or unspecified: Secondary | ICD-10-CM | POA: Diagnosis not present

## 2022-07-05 DIAGNOSIS — R8271 Bacteriuria: Secondary | ICD-10-CM | POA: Diagnosis present

## 2022-07-05 HISTORY — DX: Intrauterine synechiae: N85.6

## 2022-07-05 LAB — CBC
HCT: 34.3 % — ABNORMAL LOW (ref 36.0–46.0)
HCT: 35.1 % — ABNORMAL LOW (ref 36.0–46.0)
Hemoglobin: 11.6 g/dL — ABNORMAL LOW (ref 12.0–15.0)
Hemoglobin: 12.5 g/dL (ref 12.0–15.0)
MCH: 30.8 pg (ref 26.0–34.0)
MCH: 31.7 pg (ref 26.0–34.0)
MCHC: 33.8 g/dL (ref 30.0–36.0)
MCHC: 35.6 g/dL (ref 30.0–36.0)
MCV: 91 fL (ref 80.0–100.0)
MCV: 89.1 fL (ref 80.0–100.0)
Platelets: 218 10*3/uL (ref 150–400)
Platelets: 216 10*3/uL (ref 150–400)
RBC: 3.77 MIL/uL — ABNORMAL LOW (ref 3.87–5.11)
RBC: 3.94 MIL/uL (ref 3.87–5.11)
RDW: 12.9 % (ref 11.5–15.5)
RDW: 12.7 % (ref 11.5–15.5)
WBC: 6.6 10*3/uL (ref 4.0–10.5)
WBC: 8.8 10*3/uL (ref 4.0–10.5)
nRBC: 0 % (ref 0.0–0.2)
nRBC: 0 % (ref 0.0–0.2)

## 2022-07-05 LAB — TYPE AND SCREEN
ABO/RH(D): O POS
Antibody Screen: NEGATIVE

## 2022-07-05 LAB — RPR: RPR Ser Ql: NONREACTIVE

## 2022-07-05 MED ORDER — PRENATAL MULTIVITAMIN CH
1.0000 | ORAL_TABLET | Freq: Every day | ORAL | Status: DC
  Administered 2022-07-07 – 2022-07-08 (×2): 1 via ORAL
  Filled 2022-07-05 (×2): qty 1

## 2022-07-05 MED ORDER — TERBUTALINE SULFATE 1 MG/ML IJ SOLN: 0.2500 mg | Freq: Once | INTRAMUSCULAR | Status: DC | PRN

## 2022-07-05 MED ORDER — BENZOCAINE-MENTHOL 20-0.5 % EX AERO
1.0000 | INHALATION_SPRAY | CUTANEOUS | Status: DC | PRN
  Filled 2022-07-05: qty 56

## 2022-07-05 MED ORDER — LACTATED RINGERS IV SOLN: INTRAVENOUS | Status: DC

## 2022-07-05 MED ORDER — EPHEDRINE 5 MG/ML INJ: 10.0000 mg | INTRAVENOUS | Status: DC | PRN

## 2022-07-05 MED ORDER — FENTANYL-BUPIVACAINE-NACL 0.5-0.125-0.9 MG/250ML-% EP SOLN
12.0000 mL/h | EPIDURAL | Status: DC | PRN
  Administered 2022-07-05: 12 mL/h via EPIDURAL
  Filled 2022-07-05: qty 250

## 2022-07-05 MED ORDER — MISOPROSTOL 25 MCG QUARTER TABLET
25.0000 ug | ORAL_TABLET | Freq: Once | ORAL | Status: AC
  Administered 2022-07-05: 25 ug via VAGINAL
  Filled 2022-07-05: qty 1

## 2022-07-05 MED ORDER — ZOLPIDEM TARTRATE 5 MG PO TABS: 5.0000 mg | ORAL_TABLET | Freq: Every evening | ORAL | Status: DC | PRN

## 2022-07-05 MED ORDER — SENNOSIDES-DOCUSATE SODIUM 8.6-50 MG PO TABS
2.0000 | ORAL_TABLET | ORAL | Status: DC
  Administered 2022-07-07 – 2022-07-08 (×2): 2 via ORAL
  Filled 2022-07-05 (×2): qty 2

## 2022-07-05 MED ORDER — OXYCODONE-ACETAMINOPHEN 5-325 MG PO TABS: 1.0000 | ORAL_TABLET | ORAL | Status: DC | PRN

## 2022-07-05 MED ORDER — MAGNESIUM SULFATE 40 GM/1000ML IV SOLN: 2.0000 g/h | INTRAVENOUS | Status: DC

## 2022-07-05 MED ORDER — PHENYLEPHRINE 80 MCG/ML (10ML) SYRINGE FOR IV PUSH (FOR BLOOD PRESSURE SUPPORT): 80.0000 ug | PREFILLED_SYRINGE | INTRAVENOUS | Status: DC | PRN

## 2022-07-05 MED ORDER — PENICILLIN G POT IN DEXTROSE 60000 UNIT/ML IV SOLN
3.0000 10*6.[IU] | INTRAVENOUS | Status: DC
  Administered 2022-07-05 (×3): 3 10*6.[IU] via INTRAVENOUS
  Filled 2022-07-05 (×3): qty 50

## 2022-07-05 MED ORDER — ACETAMINOPHEN 325 MG PO TABS
650.0000 mg | ORAL_TABLET | ORAL | Status: DC | PRN
  Administered 2022-07-06 – 2022-07-07 (×4): 650 mg via ORAL
  Filled 2022-07-05 (×4): qty 2

## 2022-07-05 MED ORDER — ONDANSETRON HCL 4 MG PO TABS: 4.0000 mg | ORAL_TABLET | ORAL | Status: DC | PRN

## 2022-07-05 MED ORDER — COCONUT OIL OIL
1.0000 | TOPICAL_OIL | Status: DC | PRN
  Administered 2022-07-07: 1 via TOPICAL

## 2022-07-05 MED ORDER — DIPHENHYDRAMINE HCL 25 MG PO CAPS: 25.0000 mg | ORAL_CAPSULE | Freq: Four times a day (QID) | ORAL | Status: DC | PRN

## 2022-07-05 MED ORDER — TETANUS-DIPHTH-ACELL PERTUSSIS 5-2.5-18.5 LF-MCG/0.5 IM SUSY: 0.5000 mL | PREFILLED_SYRINGE | Freq: Once | INTRAMUSCULAR | Status: DC

## 2022-07-05 MED ORDER — OXYTOCIN BOLUS FROM INFUSION
333.0000 mL | Freq: Once | INTRAVENOUS | Status: DC
  Administered 2022-07-05: 333 mL via INTRAVENOUS

## 2022-07-05 MED ORDER — MAGNESIUM SULFATE BOLUS VIA INFUSION: 4.0000 g | Freq: Once | INTRAVENOUS | Status: DC

## 2022-07-05 MED ORDER — SIMETHICONE 80 MG PO CHEW: 80.0000 mg | CHEWABLE_TABLET | ORAL | Status: DC | PRN

## 2022-07-05 MED ORDER — LIDOCAINE HCL (PF) 1 % IJ SOLN
INTRAMUSCULAR | Status: DC | PRN
  Administered 2022-07-05 (×2): 5 mL via EPIDURAL

## 2022-07-05 MED ORDER — NIFEDIPINE ER OSMOTIC RELEASE 30 MG PO TB24
30.0000 mg | ORAL_TABLET | Freq: Every day | ORAL | Status: DC
  Administered 2022-07-05 – 2022-07-06 (×2): 30 mg via ORAL
  Filled 2022-07-05 (×2): qty 1

## 2022-07-05 MED ORDER — ACETAMINOPHEN 325 MG PO TABS: 650.0000 mg | ORAL_TABLET | ORAL | Status: DC | PRN

## 2022-07-05 MED ORDER — ONDANSETRON HCL 4 MG/2ML IJ SOLN: 4.0000 mg | INTRAMUSCULAR | Status: DC | PRN

## 2022-07-05 MED ORDER — SOD CITRATE-CITRIC ACID 500-334 MG/5ML PO SOLN: 30.0000 mL | ORAL | Status: DC | PRN

## 2022-07-05 MED ORDER — MISOPROSTOL 50MCG HALF TABLET
50.0000 ug | ORAL_TABLET | Freq: Once | ORAL | Status: AC
  Administered 2022-07-05: 50 ug via ORAL
  Filled 2022-07-05: qty 1

## 2022-07-05 MED ORDER — FUROSEMIDE 20 MG PO TABS: 20.0000 mg | ORAL_TABLET | Freq: Every day | ORAL | Status: DC

## 2022-07-05 MED ORDER — SODIUM CHLORIDE 0.9 % IV SOLN
5.0000 10*6.[IU] | Freq: Once | INTRAVENOUS | Status: AC
  Administered 2022-07-05: 5 10*6.[IU] via INTRAVENOUS
  Filled 2022-07-05: qty 5

## 2022-07-05 MED ORDER — DIBUCAINE (PERIANAL) 1 % EX OINT: 1.0000 | TOPICAL_OINTMENT | CUTANEOUS | Status: DC | PRN

## 2022-07-05 MED ORDER — LACTATED RINGERS IV SOLN: 500.0000 mL | Freq: Once | INTRAVENOUS | Status: DC

## 2022-07-05 MED ORDER — FLEET ENEMA 7-19 GM/118ML RE ENEM: 1.0000 | ENEMA | RECTAL | Status: DC | PRN

## 2022-07-05 MED ORDER — ONDANSETRON HCL 4 MG/2ML IJ SOLN: 4.0000 mg | Freq: Four times a day (QID) | INTRAMUSCULAR | Status: DC | PRN

## 2022-07-05 MED ORDER — LACTATED RINGERS IV SOLN: 500.0000 mL | INTRAVENOUS | Status: DC | PRN

## 2022-07-05 MED ORDER — OXYTOCIN-SODIUM CHLORIDE 30-0.9 UT/500ML-% IV SOLN
2.5000 [IU]/h | INTRAVENOUS | Status: DC
  Filled 2022-07-05: qty 500

## 2022-07-05 MED ORDER — IBUPROFEN 600 MG PO TABS
600.0000 mg | ORAL_TABLET | Freq: Four times a day (QID) | ORAL | Status: DC
  Administered 2022-07-05 – 2022-07-08 (×10): 600 mg via ORAL
  Filled 2022-07-05 (×10): qty 1

## 2022-07-05 MED ORDER — LIDOCAINE HCL (PF) 1 % IJ SOLN: 30.0000 mL | INTRAMUSCULAR | Status: DC | PRN

## 2022-07-05 MED ORDER — OXYCODONE-ACETAMINOPHEN 5-325 MG PO TABS: 2.0000 | ORAL_TABLET | ORAL | Status: DC | PRN

## 2022-07-05 MED ORDER — WITCH HAZEL-GLYCERIN EX PADS: 1.0000 | MEDICATED_PAD | CUTANEOUS | Status: DC | PRN

## 2022-07-05 MED ORDER — DIPHENHYDRAMINE HCL 50 MG/ML IJ SOLN: 12.5000 mg | INTRAMUSCULAR | Status: DC | PRN

## 2022-07-05 NOTE — H&P (Cosign Needed Addendum)
OBSTETRIC ADMISSION HISTORY AND PHYSICAL  Traci King is a 32 y.o. female [phone] with IUP at 57w0dby early [place] for IOL for FGR and PreE. She reports +FMs, No LOF, no VB, no blurry vision, headaches or peripheral edema, and RUQ pain.  She plans on breast and bottle feeding. She request BTL for birth control. [facility]  Dating: By early [place]--->  Estimated Date of Delivery: 07/26/22  Sono: @[redacted]w[redacted]d$ , CWD, normal anatomy, cephalic presentation, [id number] 2.6% EFW   Prenatal History/Complications:  FGR - [id number] w/o severe features H/o HSV - on Valtrex Uterine synechiae  Nursing Staff Provider  Office Location  [place]  Dating  6wk u/s  [facility] Traditional [ ]$  Centering [ ]$  Mom-Baby Dyad      Language  English  Anatomy [place] Uterine synechiae seen  Flu Vaccine  Declined  Genetic/Carrier Screen  NIPS:   LR panorama AFP:  Abnormal  2.83 MOM Horizon: neg 4  TDaP Vaccine   05/10/22 Hgb A1C or  GTT Early  Third trimester 63/92/121  COVID Vaccine Completed    LAB RESULTS   Rhogam    Blood Type O/Positive/-- (08/22 1040) O pos  Baby Feeding Plan Breast  Antibody Negative (08/22 1040)neg  Contraception BTL consent signed Rubella 1.56 (08/22 1040)imm  Circumcision Yes if boy  RPR Non Reactive (12/13 0939) neg  Pediatrician  ACharletta Cousin [facility]  HBsAg Negative (08/22 1040) neg  Support Person Mom and FOB  HCVAb neg  Prenatal Classes no HIV Non Reactive (12/13 0939)   neg  BTL Consent   GBS   (For PCN allergy, check sensitivities)   VBAC Consent   Pap Neg may 2021           DME Rx [ ]$  BP cuff [ ]$  Weight Scale Waterbirth  [ ]$  Class [ ]$  Consent [ ]$  CNM visit  PHQ9 & GAD7 [x  ] new OB [ x ] 28 weeks  [  ] 36 weeks Induction  [ ]$  Orders Entered [ ]$ Foley Y/N    Past Medical History: Past Medical History:  Diagnosis Date   Abnormal Pap smear    Herpes simplex type 1 infection    History of stomach ulcers    Preeclampsia     Uterine synechiae     Past Surgical History: Past Surgical History:  Procedure Laterality Date   TOOTH EXTRACTION      Obstetrical History: OB History     Gravida  3   Para  1   Term  0   Preterm  1   AB  1   Living  1      SAB  0   IAB  1   Ectopic  0   Multiple  0   Live Births  1           Social History Social History   Socioeconomic History   Marital status: Single    Spouse name: Not on file   Number of children: Not on file   Years of education: Not on file   Highest education level: Not on file  Occupational History   Not on file  Tobacco Use   Smoking status: Former    Packs/day: 0.25    Types: Cigarettes    Quit date: 04/09/2011    Years since quitting: 11.2    Passive exposure: Current   Smokeless tobacco:  Never  Vaping Use   Vaping Use: Never used  Substance and Sexual Activity   Alcohol use: No   Drug use: Not Currently    Types: Marijuana    Comment: daily   Sexual activity: Not Currently    Partners: Male    Birth control/protection: None  Other Topics Concern   Not on file  Social History Narrative   Not on file   Social Determinants of Health   Financial Resource Strain: Not on file  Food Insecurity: No Food Insecurity (07/05/2022)   Hunger Vital Sign    Worried About Running Out of Food in the Last Year: Never true    Ran Out of Food in the Last Year: Never true  Transportation Needs: No Transportation Needs (07/05/2022)   PRAPARE - Hydrologist (Medical): No    Lack of Transportation (Non-Medical): No  Physical Activity: Not on file  Stress: Not on file  Social Connections: Not on file    Family History: Family History  Problem Relation Age of Onset   Asthma Mother    Hypertension Mother    Diabetes Mother    Hypertension Maternal Grandmother    Diabetes Maternal Grandfather    Cancer Neg Hx    Heart disease Neg Hx     Allergies: No Known Allergies  Medications  Prior to Admission  Medication Sig Dispense Refill Last Dose   furosemide (LASIX) 20 MG tablet Take 1 tablet (20 mg total) by mouth daily. (Patient not taking: Reported on 06/25/2022) 3 tablet 0    NIFEdipine (ADALAT CC) 60 MG 24 hr tablet Take 1 tablet (60 mg total) by mouth daily. Can increase to twice a day as needed for symptomatic contractions 30 tablet 1    potassium chloride (KLOR-CON) 10 MEQ tablet Take 1 tablet (10 mEq total) by mouth daily. (Patient not taking: Reported on 06/25/2022) 3 tablet 0    Prenatal 28-0.8 MG TABS Take 1 tablet by mouth daily. 30 tablet 12    valACYclovir (VALTREX) 1000 MG tablet Take 1 tablet (1,000 mg total) by mouth daily. 30 tablet 2      Review of Systems   All systems reviewed and negative except as stated in HPI  Blood pressure (!) 140/96, pulse 96, temperature 98.2 F (36.8 C), temperature source Oral, resp. rate 16, height 5' 3"$  (1.6 m), weight 67 kg, last menstrual period 10/01/2021. General appearance: alert, cooperative, and appears stated age Lungs: clear to auscultation bilaterally Heart: regular rate and rhythm Abdomen: soft, non-tender; bowel sounds normal Extremities: Homans sign is negative, no sign of DVT Presentation: cephalic Fetal monitoringBaseline: 150 bpm, Variability: Good {> 6 bpm), Accelerations: Reactive, and Decelerations: Absent Uterine activity: irregular Dilation: Fingertip Effacement (%): 60 Station: -2 Exam by:: k fields, rn   Prenatal labs: ABO, Rh: --/--/O POS (02/09 0700) Antibody: NEG (02/09 0700) Rubella: 1.56 (08/22 1040) RPR: Non Reactive (12/13 0939)  HBsAg: Negative (08/22 1040)  HIV: Non Reactive (12/13 0939)  GBS: Positive/-- (02/07 0000)  1 hr Glucola: normal (05/08/2022) Genetic screening: normal (01/15/2022) Anatomy US: normal (03/04/2022)  Prenatal Transfer Tool  Maternal Diabetes: No Genetic Screening: Normal Maternal Ultrasounds/Referrals: IUGR Fetal Ultrasounds or other Referrals:   Referred to Materal Fetal Medicine  Maternal Substance Abuse:  No Significant Maternal Medications: Valtrex Significant Maternal Lab Results: Group B Strep positive  Results for orders placed or performed during the hospital encounter of 07/05/22 (from the past 24 hour(s))  CBC   Collection Time:  07/05/22  7:00 AM  Result Value Ref Range   WBC 6.6 4.0 - 10.5 K/uL   RBC 3.77 (L) 3.87 - 5.11 MIL/uL   Hemoglobin 11.6 (L) 12.0 - 15.0 g/dL   HCT 34.3 (L) 36.0 - 46.0 %   MCV 91.0 80.0 - 100.0 fL   MCH 30.8 26.0 - 34.0 pg   MCHC 33.8 30.0 - 36.0 g/dL   RDW 12.9 11.5 - 15.5 %   Platelets 218 150 - 400 K/uL   nRBC 0.0 0.0 - 0.2 %  Type and screen   Collection Time: 07/05/22  7:00 AM  Result Value Ref Range   ABO/RH(D) O POS    Antibody Screen NEG    Sample Expiration      07/08/2022,2359 Performed at [facility], [place] [address] [place], [place] [address]     Patient Active Problem List   Diagnosis Date Noted   Pre-eclampsia affecting pregnancy, antepartum 07/05/2022   Fetal growth restriction antepartum 06/21/2022   Uterine synechiae 05/15/2022   Mild pre-eclampsia 05/10/2022   Abnormal alpha fetoprotein (AFP) level 02/19/2022   Supervision of high risk pregnancy in third trimester 01/15/2022   Marijuana abuse 01/15/2022   Nausea and vomiting during pregnancy prior to [redacted] weeks gestation 01/15/2022   History of herpes genitalis 01/15/2022   History of preterm delivery, currently pregnant 01/15/2022   Cannabinoid hyperemesis syndrome 01/15/2022   GBS bacteriuria 12/09/2021    Assessment/Plan:  KAYLONI GASTER is a 32 y.o. [phone] at 45w0dhere for IOL for FGR and PreE  #Labor: IOL with Cytotec 50/25. Plans to walk. Will monitor progress and consider Foley balloon at next check. #Pain: Epidural upon request #FWB: Cat 1 #ID:  GBS pos - on PCN #MOF: Breast and bottle feeding #MOC: BTL #Circ:  Yes  PreE: BP slightly elevated upon admission, now within normal range.  Asymptomatic. CTM  KColletta Maryland MD  07/05/2022, 8:49 AM    Attestation of Supervision of Student:  I confirm that I have verified the information documented in the  resident's  note and that I have also personally reperformed the history, physical exam and all medical decision making activities.  I have verified that all services and findings are accurately documented in this student's note; and I agree with management and plan as outlined in the documentation. I have also made any necessary editorial changes.  HMarcille BuffyDNP, CNM  07/05/22  10:50 AM

## 2022-07-05 NOTE — Progress Notes (Signed)
Labor Progress Note Traci King is a 32 y.o. [phone] at 26w0dpresented for IOL for pre-eclampsia and FGR with normal dopplers S: patient is doing well, feeling pain with contractions which are happening every 1-3 mins.   O:  BP 133/82   Pulse (!) 107   Temp 99.3 F (37.4 C) (Oral)   Resp 16   Ht 5' 3"$  (1.6 m)   Wt 67 kg   LMP 10/01/2021   BMI 26.16 kg/m   EFM: 145bpm /moderate /+accels, no decels  Toco: contractions every 1-3 mins  CVE: Dilation: 3.5 Effacement (%): 80 Cervical Position: Posterior Station: 0 Presentation: Vertex Exam by:: dr Danae Oland   A&P: 32y.o. [id number] for pre-eclampsia w/o SF and FGG2 #Labor: Progressing well. S/p cytotec X 1 round, with good cervical change. Contractions are frequent and painful. Would really want an epidural before further intervention. - plan to continue ambulation, plan for an epidural. - if contractions space out or minimal cervical change at next check, will consider AROM +/- pitocin augmentation #Pain: Epidural desired #FWB: Cat 1 #GBS positive - s/p PCN X 2 doses  Pre-eclampsia w/o SF:  blood pressures well controlled.   ChLiliane ChannelD MPH OB Fellow, [place]/01/2023

## 2022-07-05 NOTE — Plan of Care (Signed)
  Problem: Education: Goal: Knowledge of Childbirth will improve 07/05/2022 2130 by Thera Flake, RN Outcome: Completed/Met 07/05/2022 1947 by Thera Flake, RN Outcome: Progressing Goal: Ability to make informed decisions regarding treatment and plan of care will improve 07/05/2022 2130 by Thera Flake, RN Outcome: Completed/Met 07/05/2022 1947 by Thera Flake, RN Outcome: Progressing Goal: Ability to state and carry out methods to decrease the pain will improve 07/05/2022 2130 by Thera Flake, RN Outcome: Completed/Met 07/05/2022 1947 by Thera Flake, RN Outcome: Progressing Goal: Individualized Educational Video(s) 07/05/2022 2130 by Thera Flake, RN Outcome: Completed/Met 07/05/2022 1947 by Thera Flake, RN Outcome: Progressing   Problem: Coping: Goal: Ability to verbalize concerns and feelings about labor and delivery will improve 07/05/2022 2130 by Thera Flake, RN Outcome: Completed/Met 07/05/2022 1947 by Thera Flake, RN Outcome: Progressing   Problem: Life Cycle: Goal: Ability to make normal progression through stages of labor will improve 07/05/2022 2130 by Thera Flake, RN Outcome: Completed/Met 07/05/2022 1947 by Thera Flake, RN Outcome: Progressing Goal: Ability to effectively push during vaginal delivery will improve 07/05/2022 2130 by Thera Flake, RN Outcome: Completed/Met 07/05/2022 1947 by Thera Flake, RN Outcome: Progressing   Problem: Role Relationship: Goal: Will demonstrate positive interactions with the child 07/05/2022 2130 by Thera Flake, RN Outcome: Completed/Met 07/05/2022 1947 by Thera Flake, RN Outcome: Progressing   Problem: Safety: Goal: Risk of complications during labor and delivery will decrease 07/05/2022 2130 by Thera Flake, RN Outcome: Completed/Met 07/05/2022 1947 by Thera Flake, RN Outcome: Progressing   Problem: Pain Management: Goal: Relief or control of pain from uterine  contractions will improve 07/05/2022 2130 by Thera Flake, RN Outcome: Completed/Met 07/05/2022 1947 by Thera Flake, RN Outcome: Progressing

## 2022-07-05 NOTE — Anesthesia Procedure Notes (Signed)
Epidural Patient location during procedure: OB Start time: 07/05/2022 4:02 PM End time: 07/05/2022 4:10 PM  Staffing Anesthesiologist: Josephine Igo, MD Performed: anesthesiologist   Preanesthetic Checklist Completed: patient identified, IV checked, site marked, risks and benefits discussed, surgical consent, monitors and equipment checked, pre-op evaluation and timeout performed  Epidural Patient position: sitting Prep: DuraPrep and site prepped and draped Patient monitoring: continuous pulse ox and blood pressure Approach: midline Location: L3-L4 Injection technique: LOR air  Needle:  Needle type: Tuohy  Needle gauge: 17 G Needle length: 9 cm and 9 Needle insertion depth: 7 cm Catheter type: closed end flexible Catheter size: 19 Gauge Catheter at skin depth: 12 cm Test dose: negative and Other  Assessment Events: blood not aspirated, no cerebrospinal fluid, injection not painful, no injection resistance, no paresthesia and negative IV test  Additional Notes Patient identified. Risks and benefits discussed including failed block, incomplete  Pain control, post dural puncture headache, nerve damage, paralysis, blood pressure Changes, nausea, vomiting, reactions to medications-both toxic and allergic and post Partum back pain. All questions were answered. Patient expressed understanding and wished to proceed. Sterile technique was used throughout procedure. Epidural site was Dressed with sterile barrier dressing. No paresthesias, signs of intravascular injection Or signs of intrathecal spread were encountered.  Patient was more comfortable after the epidural was dosed. Please see RN's note for documentation of vital signs and FHR which are stable. Reason for block:procedure for pain

## 2022-07-05 NOTE — Discharge Summary (Signed)
Postpartum Discharge Summary  Date of Service updated***     Patient Name: Traci King DOB: January 10, 1991 MRN: [id number]  Date of admission: 07/05/2022 Delivery date:07/05/2022  Delivering provider:   Date of discharge: 07/05/2022  Admitting diagnosis: Pre-eclampsia affecting pregnancy, antepartum [O14.90] Intrauterine pregnancy: [redacted]w[redacted]d    Secondary diagnosis:  Principal Problem:   Pre-eclampsia affecting pregnancy, antepartum Active Problems:   GBS bacteriuria   Supervision of high risk pregnancy in third trimester   History of herpes genitalis   History of preterm delivery, currently pregnant   Abnormal alpha fetoprotein (AFP) level   Fetal growth restriction antepartum   Vaginal delivery  Additional problems: None    Discharge diagnosis: Term Pregnancy Delivered and Preeclampsia (mild)                                              Post partum procedures:{Postpartum procedures:23558} Augmentation: AROM and Cytotec Complications: None  Hospital course: Induction of Labor With Vaginal Delivery   32y.o. yo [id number] admitted to the hospital 07/05/2022 for induction of labor.  Indication for induction: Preeclampsia and FGR .  Patient had an uncomplicated labor course. Membrane Rupture Time/Date: 6:12 PM ,07/05/2022   Delivery Method:Vaginal, Spontaneous  Episiotomy:   Lacerations:    Details of delivery can be found in separate delivery note.  Patient had a postpartum course complicated by***. Patient is discharged home 07/05/22.  Newborn Data: Birth date:07/05/2022  Birth time:8:21 PM  Gender:Female  Living status:  Apgars: ,  Weight:   Magnesium Sulfate received: No BMZ received: No Rhophylac:N/A MMR:N/A T-DaP:Given prenatally Flu: {FWU:107179ransfusion:{Transfusion received:30440034}  Physical exam  Vitals:   07/05/22 2047 07/05/22 2101 07/05/22 2103 07/05/22 2116  BP: (!) 152/82 (!) 163/93 (!) 155/92 (!) 163/98  Pulse: 91 86 83 84  Resp:       Temp:      TempSrc:      SpO2:      Weight:      Height:       General: {Exam; general:21111117} Lochia: {Desc; appropriate/inappropriate:30686::"appropriate"} Uterine Fundus: {Desc; firm/soft:30687} Incision: {Exam; incision:21111123} DVT Evaluation: {Exam; dvt:2111122} Labs: Lab Results  Component Value Date   WBC 8.8 07/05/2022   HGB 12.5 07/05/2022   HCT 35.1 (L) 07/05/2022   MCV 89.1 07/05/2022   PLT 216 07/05/2022      Latest Ref Rng & Units 07/03/2022    9:18 AM  CMP  Glucose 70 - 99 mg/dL 127   BUN 6 - 20 mg/dL 6   Creatinine 0.57 - 1.00 mg/dL 0.53   Sodium 134 - 144 mmol/L 137   Potassium 3.5 - 5.2 mmol/L 3.0   Chloride 96 - 106 mmol/L 101   CO2 20 - 29 mmol/L 19   Calcium 8.7 - 10.2 mg/dL 8.6   Total Protein 6.0 - 8.5 g/dL 5.9   Total Bilirubin 0.0 - 1.2 mg/dL 0.3   Alkaline Phos 44 - 121 IU/L 157   AST 0 - 40 IU/L 21   ALT 0 - 32 IU/L 17    Edinburgh Score:     No data to display           After visit meds:  Allergies as of 07/05/2022   No Known Allergies   Med Rec must be completed [facility]  Discharge home in stable condition Infant Feeding: {Baby feeding:23562} Infant Disposition:{CHL IP OB HOME WITH DX:[id number] Discharge instruction: per After Visit Summary and Postpartum booklet. Activity: Advance as tolerated. Pelvic rest for 6 weeks.  Diet: {OB BY:630183 Future Appointments: Future Appointments  Date Time Provider [place]  09/06/2022  2:40 PM Tobb, Godfrey Pick, DO [facility] None   Follow up Visit: Message sent   Please schedule this patient for a In person postpartum visit in 4 weeks with the following provider: Any provider. Additional Postpartum F/U:BP check 1 week  High risk pregnancy complicated by: HTN Delivery mode:  Vaginal, Spontaneous  Anticipated Birth Control:  BTL done [facility]   07/05/2022 Concepcion Living, MD

## 2022-07-05 NOTE — Anesthesia Preprocedure Evaluation (Signed)
Anesthesia Evaluation   Patient awake    Reviewed: Allergy & Precautions, Patient's Chart, lab work & pertinent test results  Airway Mallampati: II       Dental no notable dental hx.    Pulmonary former smoker   Pulmonary exam normal        Cardiovascular hypertension, Pt. on medications Normal cardiovascular exam     Neuro/Psych negative neurological ROS  negative psych ROS   GI/Hepatic PUD,,,(+)     substance abuse  marijuana useHx/o Cannabinoid hyperemesis syndrome   Endo/Other  negative endocrine ROS    Renal/GU negative Renal ROS  negative genitourinary   Musculoskeletal negative musculoskeletal ROS (+)    Abdominal Normal abdominal exam  (+)   Peds  Hematology  (+) Blood dyscrasia, anemia   Anesthesia Other Findings   Reproductive/Obstetrics (+) Pregnancy Pre Eclampsia Hx/o uterine synechia                              Anesthesia Physical Anesthesia Plan  ASA: 2  Anesthesia Plan: Epidural   Post-op Pain Management: Minimal or no pain anticipated   Induction:   PONV Risk Score and Plan:   Airway Management Planned: Natural Airway  Additional Equipment:   Intra-op Plan:   Post-operative Plan:   Informed Consent: I have reviewed the patients History and Physical, chart, labs and discussed the procedure including the risks, benefits and alternatives for the proposed anesthesia with the patient or authorized representative who has indicated his/her understanding and acceptance.       Plan Discussed with: Anesthesiologist  Anesthesia Plan Comments:          Anesthesia Quick Evaluation

## 2022-07-05 NOTE — Progress Notes (Signed)
Labor Progress Note SINIYAH OLESON is a 32 y.o. [id number] at 57w0dpresented for IOL for pre-eclampsia and FGR with normal dopplers S: doing well. Very comfortable now, with epidural.  O:  BP (!) 150/89   Pulse 88   Temp 97.6 F (36.4 C) (Oral)   Resp 18   Ht 5' 3"$  (1.6 m)   Wt 67 kg   LMP 10/01/2021   SpO2 100%   BMI 26.16 kg/m   EFM: 120bpm, moderate variability, +accels, early decels  Toco: contractions every 1-3 mins  CVE: Dilation: 4.5 Effacement (%): 90 Cervical Position: Posterior Station: Plus 1 Presentation: Vertex Exam by:: Valiant Dills   A&P: 32y.o. [id number] for pre-eclampsia w/o SF and FGG2 #Labor: Progressing well. S/p cytotec X 1 round, AROM performed with clear fluid.  #Pain: Epidural in place #FWB: Cat 1 #GBS positive - PCN - adequately treated  Pre-eclampsia w/o SF:  blood pressures in mild range. No symptoms  ChLiliane ChannelD MPH OB Fellow, [place]/01/2023

## 2022-07-05 NOTE — Plan of Care (Signed)

## 2022-07-06 ENCOUNTER — Inpatient Hospital Stay (HOSPITAL_COMMUNITY): Payer: Medicaid Other | Admitting: Anesthesiology

## 2022-07-06 ENCOUNTER — Other Ambulatory Visit: Payer: Self-pay

## 2022-07-06 ENCOUNTER — Encounter (HOSPITAL_COMMUNITY): Payer: Self-pay | Admitting: Obstetrics & Gynecology

## 2022-07-06 ENCOUNTER — Encounter (HOSPITAL_COMMUNITY)
Admission: RE | Disposition: A | Discharge status: Home / Self Care | Payer: Self-pay | Source: Home / Self Care | Attending: Obstetrics and Gynecology

## 2022-07-06 DIAGNOSIS — Z302 Encounter for sterilization: Secondary | ICD-10-CM

## 2022-07-06 HISTORY — PX: TUBAL LIGATION: SHX77

## 2022-07-06 LAB — CBC
HCT: 37.8 % (ref 36.0–46.0)
Hemoglobin: 13.5 g/dL (ref 12.0–15.0)
MCH: 31.3 pg (ref 26.0–34.0)
MCHC: 35.7 g/dL (ref 30.0–36.0)
MCV: 87.7 fL (ref 80.0–100.0)
Platelets: 228 10*3/uL (ref 150–400)
RBC: 4.31 MIL/uL (ref 3.87–5.11)
RDW: 13.1 % (ref 11.5–15.5)
WBC: 8.8 10*3/uL (ref 4.0–10.5)
nRBC: 0 % (ref 0.0–0.2)

## 2022-07-06 LAB — COMPREHENSIVE METABOLIC PANEL
ALT: 31 U/L (ref 0–44)
AST: 42 U/L — ABNORMAL HIGH (ref 15–41)
Albumin: 3.2 g/dL — ABNORMAL LOW (ref 3.5–5.0)
Alkaline Phosphatase: 145 U/L — ABNORMAL HIGH (ref 38–126)
Anion gap: 14 (ref 5–15)
BUN: <5 mg/dL — ABNORMAL LOW (ref 6–20)
CO2: 22 mmol/L (ref 22–32)
Calcium: 8.6 mg/dL — ABNORMAL LOW (ref 8.9–10.3)
Chloride: 102 mmol/L (ref 98–111)
Creatinine, Ser: 0.58 mg/dL (ref 0.44–1.00)
GFR, Estimated: >60 mL/min (ref >60)
Glucose, Bld: 85 mg/dL (ref 70–99)
Potassium: 2.4 mmol/L — CL (ref 3.5–5.1)
Sodium: 138 mmol/L (ref 135–145)
Total Bilirubin: 0.6 mg/dL (ref 0.3–1.2)
Total Protein: 6.9 g/dL (ref 6.5–8.1)

## 2022-07-06 SURGERY — LIGATION, FALLOPIAN TUBE, POSTPARTUM
Anesthesia: Epidural | Laterality: Bilateral

## 2022-07-06 MED ORDER — FAMOTIDINE 20 MG PO TABS
40.0000 mg | ORAL_TABLET | Freq: Once | ORAL | Status: AC
  Administered 2022-07-06: 40 mg via ORAL
  Filled 2022-07-06: qty 2

## 2022-07-06 MED ORDER — FUROSEMIDE 20 MG PO TABS
20.0000 mg | ORAL_TABLET | Freq: Two times a day (BID) | ORAL | Status: DC
  Administered 2022-07-06: 20 mg via ORAL
  Filled 2022-07-06: qty 1

## 2022-07-06 MED ORDER — NIFEDIPINE 10 MG PO CAPS: 20.0000 mg | ORAL_CAPSULE | ORAL | Status: DC | PRN

## 2022-07-06 MED ORDER — MAGNESIUM SULFATE 40 GM/1000ML IV SOLN
2.0000 g/h | INTRAVENOUS | Status: AC
  Administered 2022-07-07: 2 g/h via INTRAVENOUS
  Filled 2022-07-06: qty 1000

## 2022-07-06 MED ORDER — NIFEDIPINE ER OSMOTIC RELEASE 30 MG PO TB24: 30.0000 mg | ORAL_TABLET | Freq: Every day | ORAL | Status: DC

## 2022-07-06 MED ORDER — ONDANSETRON HCL 4 MG/2ML IJ SOLN
INTRAMUSCULAR | Status: AC
  Filled 2022-07-06: qty 2

## 2022-07-06 MED ORDER — LIDOCAINE-EPINEPHRINE (PF) 2 %-1:200000 IJ SOLN
INTRAMUSCULAR | Status: DC | PRN
  Administered 2022-07-06 (×4): 5 mL via INTRADERMAL

## 2022-07-06 MED ORDER — LABETALOL HCL 5 MG/ML IV SOLN: 40.0000 mg | INTRAVENOUS | Status: DC | PRN

## 2022-07-06 MED ORDER — LABETALOL HCL 5 MG/ML IV SOLN: 20.0000 mg | INTRAVENOUS | Status: DC | PRN

## 2022-07-06 MED ORDER — NIFEDIPINE 10 MG PO CAPS
10.0000 mg | ORAL_CAPSULE | ORAL | Status: DC | PRN
  Administered 2022-07-06: 10 mg via ORAL

## 2022-07-06 MED ORDER — LABETALOL HCL 5 MG/ML IV SOLN: 80.0000 mg | INTRAVENOUS | Status: DC | PRN

## 2022-07-06 MED ORDER — HYDRALAZINE HCL 20 MG/ML IJ SOLN: 10.0000 mg | INTRAMUSCULAR | Status: DC | PRN

## 2022-07-06 MED ORDER — LABETALOL HCL 5 MG/ML IV SOLN
INTRAVENOUS | Status: AC
  Filled 2022-07-06: qty 4

## 2022-07-06 MED ORDER — NIFEDIPINE 10 MG PO CAPS
ORAL_CAPSULE | ORAL | Status: AC
  Filled 2022-07-06: qty 1

## 2022-07-06 MED ORDER — NIFEDIPINE 10 MG PO CAPS
10.0000 mg | ORAL_CAPSULE | Freq: Once | ORAL | Status: AC
  Administered 2022-07-06: 10 mg via ORAL

## 2022-07-06 MED ORDER — MAGNESIUM SULFATE BOLUS VIA INFUSION
4.0000 g | Freq: Once | INTRAVENOUS | Status: AC
  Administered 2022-07-06: 4 g via INTRAVENOUS
  Filled 2022-07-06: qty 1000

## 2022-07-06 MED ORDER — OXYCODONE HCL 5 MG/5ML PO SOLN: 5.0000 mg | Freq: Once | ORAL | Status: DC | PRN

## 2022-07-06 MED ORDER — FENTANYL CITRATE (PF) 100 MCG/2ML IJ SOLN
INTRAMUSCULAR | Status: AC
  Filled 2022-07-06: qty 2

## 2022-07-06 MED ORDER — LACTATED RINGERS IV SOLN: INTRAVENOUS | Status: DC

## 2022-07-06 MED ORDER — MAGNESIUM SULFATE 40 GM/1000ML IV SOLN
INTRAVENOUS | Status: AC
  Filled 2022-07-06: qty 1000

## 2022-07-06 MED ORDER — LIDOCAINE-EPINEPHRINE (PF) 2 %-1:200000 IJ SOLN
INTRAMUSCULAR | Status: AC
  Filled 2022-07-06: qty 80

## 2022-07-06 MED ORDER — NIFEDIPINE ER OSMOTIC RELEASE 30 MG PO TB24: 30.0000 mg | ORAL_TABLET | Freq: Once | ORAL | Status: DC

## 2022-07-06 MED ORDER — HYDROMORPHONE HCL 1 MG/ML IJ SOLN
INTRAMUSCULAR | Status: AC
  Filled 2022-07-06: qty 0.5

## 2022-07-06 MED ORDER — NIFEDIPINE ER OSMOTIC RELEASE 60 MG PO TB24
60.0000 mg | ORAL_TABLET | Freq: Every day | ORAL | Status: DC
  Administered 2022-07-07: 60 mg via ORAL
  Filled 2022-07-06: qty 1

## 2022-07-06 MED ORDER — POTASSIUM CHLORIDE 10 MEQ/100ML IV SOLN
10.0000 meq | INTRAVENOUS | Status: AC
  Administered 2022-07-06 (×4): 10 meq via INTRAVENOUS
  Filled 2022-07-06 (×4): qty 100

## 2022-07-06 MED ORDER — OXYCODONE HCL 5 MG PO TABS: 5.0000 mg | ORAL_TABLET | Freq: Once | ORAL | Status: DC | PRN

## 2022-07-06 MED ORDER — BUPIVACAINE HCL (PF) 0.25 % IJ SOLN
INTRAMUSCULAR | Status: DC | PRN
  Administered 2022-07-06: 20 mL
  Administered 2022-07-06: 10 mL

## 2022-07-06 MED ORDER — POTASSIUM CHLORIDE CRYS ER 20 MEQ PO TBCR
20.0000 meq | EXTENDED_RELEASE_TABLET | Freq: Two times a day (BID) | ORAL | Status: AC
  Administered 2022-07-06 – 2022-07-07 (×3): 20 meq via ORAL
  Filled 2022-07-06 (×3): qty 1

## 2022-07-06 MED ORDER — HYDROMORPHONE HCL 1 MG/ML IJ SOLN
0.2500 mg | INTRAMUSCULAR | Status: DC | PRN
  Administered 2022-07-06: 0.5 mg via INTRAVENOUS

## 2022-07-06 MED ORDER — FENTANYL CITRATE (PF) 100 MCG/2ML IJ SOLN
INTRAMUSCULAR | Status: DC | PRN
  Administered 2022-07-06: 100 ug via INTRAVENOUS

## 2022-07-06 MED ORDER — ONDANSETRON HCL 4 MG/2ML IJ SOLN
4.0000 mg | Freq: Once | INTRAMUSCULAR | Status: AC | PRN
  Administered 2022-07-06: 4 mg via INTRAVENOUS

## 2022-07-06 MED ORDER — BUPIVACAINE HCL (PF) 0.25 % IJ SOLN
INTRAMUSCULAR | Status: AC
  Filled 2022-07-06: qty 30

## 2022-07-06 MED ORDER — METOCLOPRAMIDE HCL 10 MG PO TABS
10.0000 mg | ORAL_TABLET | Freq: Once | ORAL | Status: AC
  Administered 2022-07-06: 10 mg via ORAL
  Filled 2022-07-06: qty 1

## 2022-07-06 SURGICAL SUPPLY — 20 items
BLADE SURG 11 STRL SS (BLADE) ×1 IMPLANT
CLIP FILSHIE TUBAL LIGA STRL (Clip) ×1 IMPLANT
CLOTH BEACON ORANGE TIMEOUT ST (SAFETY) ×1 IMPLANT
DRSG OPSITE POSTOP 3X4 (GAUZE/BANDAGES/DRESSINGS) ×1 IMPLANT
DURAPREP 26ML APPLICATOR (WOUND CARE) ×1 IMPLANT
GLOVE BIO SURGEON STRL SZ 6.5 (GLOVE) ×1 IMPLANT
GLOVE BIOGEL PI IND STRL 7.0 (GLOVE) ×2 IMPLANT
GOWN STRL REUS W/TWL LRG LVL3 (GOWN DISPOSABLE) ×2 IMPLANT
NEEDLE HYPO 22GX1.5 SAFETY (NEEDLE) ×1 IMPLANT
NS IRRIG 1000ML POUR BTL (IV SOLUTION) ×1 IMPLANT
PACK ABDOMINAL MINOR (CUSTOM PROCEDURE TRAY) ×1 IMPLANT
PROTECTOR NERVE ULNAR (MISCELLANEOUS) ×1 IMPLANT
SPONGE LAP 4X18 RFD (DISPOSABLE) IMPLANT
SUT VIC AB 0 CT1 27 (SUTURE) ×1
SUT VIC AB 0 CT1 27XBRD ANBCTR (SUTURE) ×1 IMPLANT
SUT VICRYL 4-0 PS2 18IN ABS (SUTURE) ×1 IMPLANT
SYR CONTROL 10ML LL (SYRINGE) ×1 IMPLANT
TOWEL OR 17X24 6PK STRL BLUE (TOWEL DISPOSABLE) ×2 IMPLANT
TRAY FOLEY CATH SILVER 16FR (SET/KITS/TRAYS/PACK) ×1 IMPLANT
WATER STERILE IRR 1000ML POUR (IV SOLUTION) ×1 IMPLANT

## 2022-07-06 NOTE — Progress Notes (Addendum)
Notified MD of elevate blood pressure of 156/99, prior to surgery. Bloop pressure medications given per MD orders.  Will continue to monitor.    Donah Driver, RN

## 2022-07-06 NOTE — Progress Notes (Signed)
Patient transferred to [facility] from OR.  Personal belongings taken to [facility], and patient's mother updated and escorted to [facility].   Donah Driver, RN

## 2022-07-06 NOTE — Progress Notes (Addendum)
POSTPARTUM PROGRESS NOTE  Post Partum Day 1  Subjective:  Traci King is a 32 y.o. [id number] s/p VD at [redacted]w[redacted]d  She reports she is doing well. No acute events overnight. She denies any problems with ambulating, voiding or po intake. Denies nausea or vomiting.  Pain is well controlled.  Lochia is appropriate.  Objective: Blood pressure (!) 138/90, pulse 71, temperature 98.5 F (36.9 C), temperature source Oral, resp. rate 17, height 5' 3"$  (1.6 m), weight 67 kg, last menstrual period 10/01/2021, SpO2 96 %, unknown if currently breastfeeding.  Physical Exam:  General: alert, cooperative and no distress Chest: no respiratory distress Heart:regular rate, distal pulses intact Abdomen: soft, nontender,  Uterine Fundus: firm, appropriately tender, below umbilicus  DVT Evaluation: No calf swelling or tenderness Extremities: No LE edema Skin: warm, dry  Recent Labs    07/05/22 0700 07/05/22 1456  HGB 11.6* 12.5  HCT 34.3* 35.1*    Assessment/Plan: Traci King a 32y.o. [id number]/p VD at 310w0d PPD#1 - Doing well  Routine postpartum care  Patient desires permanent sterilization.  Other reversible forms of contraception were discussed with patient; she declines all other modalities. Risks of procedure discussed with patient including but not limited to: risk of regret, permanence of method, bleeding, infection, injury to surrounding organs and need for additional procedures.  Failure risk of about 1% with increased risk of ectopic gestation if pregnancy occurs was also discussed with patient.  Also discussed possibility of post-tubal pain syndrome. Patient verbalized understanding of these risks and wants to proceed with sterilization.  Written informed consent obtained 05/23/2023.  To OR when ready.  preE  BP elevated. Has not received lasix, 2083mcheduled today BID. On procardia 21m44mily.  -CTM BPs  Contraception: BLT planned today for 1130 Feeding: Both Dispo: Plan  for discharge 2/11.   LOS: 1 day   SimoGerlene Fee OB Fellow, [place]0/2024, 7:40 AM  Attestation of Attending Supervision of Obstetric Fellow: Evaluation and management procedures were performed by the Obstetric Fellow under my supervision and collaboration.  I have reviewed the Obstetric Fellow's note and chart, and I agree with the management and plan.  JAMEEmeterio Reeve, FACOOld Greenwichending ObstExcelmeUpper Valley Medical Center

## 2022-07-06 NOTE — Progress Notes (Signed)
Patient had elevated BP of 162/92 on admission to [facility] and given procardia 45m. Notified Cresenzo MD of elevated BP of 150/91 following procardia dose. Patient asymptomatic. Per Cresenzo MD no new orders, and to call MD for BPs of 160/110.  CStarleen ArmsRN

## 2022-07-06 NOTE — Progress Notes (Signed)
CSW acknowledges consult placed for [facility] use during pregnancy. CSW attempted to meet with MOB; however, MOB was being transferred to [facility]. CSW to follow up at a later time.   Signed,  Berniece Salines, MSW, [place], LCASA 07/06/2022 5:42 PM

## 2022-07-06 NOTE — Anesthesia Preprocedure Evaluation (Addendum)
Anesthesia Evaluation  Patient identified by MRN, date of birth, ID band Patient awake    Reviewed: Allergy & Precautions, NPO status , Patient's Chart, lab work & pertinent test results, reviewed documented beta blocker date and time   Airway Mallampati: II  TM Distance: >3 FB Neck ROM: Full    Dental no notable dental hx. (+) Teeth Intact, Dental Advisory Given   Pulmonary former smoker   Pulmonary exam normal breath sounds clear to auscultation       Cardiovascular hypertension, Pt. on medications Normal cardiovascular exam Rhythm:Regular Rate:Normal     Neuro/Psych negative neurological ROS  negative psych ROS   GI/Hepatic PUD,,,(+)     substance abuse  marijuana useHx/o Cannabinoid hyperemesis syndrome   Endo/Other  negative endocrine ROS    Renal/GU negative Renal ROS  negative genitourinary   Musculoskeletal negative musculoskeletal ROS (+)    Abdominal Normal abdominal exam  (+)   Peds  Hematology  (+) Blood dyscrasia, anemia   Anesthesia Other Findings   Reproductive/Obstetrics Pre Eclampsia Hx/o uterine synechia Desires sterilization                             Anesthesia Physical Anesthesia Plan  ASA: 2  Anesthesia Plan: Epidural   Post-op Pain Management: Minimal or no pain anticipated and Regional block*   Induction: Intravenous  PONV Risk Score and Plan: 3 and Treatment may vary due to age or medical condition  Airway Management Planned: Natural Airway  Additional Equipment:   Intra-op Plan:   Post-operative Plan:   Informed Consent: I have reviewed the patients History and Physical, chart, labs and discussed the procedure including the risks, benefits and alternatives for the proposed anesthesia with the patient or authorized representative who has indicated his/her understanding and acceptance.     Dental advisory given  Plan Discussed with:  Anesthesiologist and CRNA  Anesthesia Plan Comments: (Labor epidural functioned well for L&D for 3 hours then didn't work, so I will attempt to using it for the PPBTL. If after dosing epidural she does not get an adequate sensory level, will place SAB. Bryon Lions,  MD)        Anesthesia Quick Evaluation

## 2022-07-06 NOTE — Progress Notes (Signed)
Post Partum Day 1 Subjective: Mild headache resolved  Objective: Blood pressure (!) 172/96, pulse (!) 101, temperature 98.6 F (37 C), temperature source Oral, resp. rate 15, height 5' 3"$  (1.6 m), weight 67 kg, last menstrual period 10/01/2021, SpO2 98 %, unknown if currently breastfeeding.  Physical Exam:  General: alert, cooperative, and no distress Lochia: appropriate Uterine Fundus: firm Incision: no significant drainage DVT Evaluation: No evidence of DVT seen on physical exam.  Recent Labs    07/05/22 0700 07/05/22 1456  HGB 11.6* 12.5  HCT 34.3* 35.1*    Assessment/Plan: Preeclampsia with SF. Start magnesium and continue protocol for BP management. Transfer to [facility]   LOS: 1 day   Emeterio Reeve, MD 07/06/2022, 3:29 PM

## 2022-07-06 NOTE — Anesthesia Postprocedure Evaluation (Signed)
Anesthesia Post Note  Patient: Traci King  Procedure(s) Performed: POST PARTUM TUBAL LIGATION (Bilateral)     Patient location during evaluation: PACU Anesthesia Type: Epidural Level of consciousness: oriented and awake and alert Pain management: pain level controlled Vital Signs Assessment: post-procedure vital signs reviewed and stable Respiratory status: spontaneous breathing, respiratory function stable and nonlabored ventilation Cardiovascular status: blood pressure returned to baseline and stable Postop Assessment: no headache, no backache, no apparent nausea or vomiting, epidural receding and patient able to bend at knees Anesthetic complications: no   No notable events documented.  Last Vitals:  Vitals:   07/06/22 1515 07/06/22 1520  BP: (!) 170/100 (!) 172/96  Pulse: 86 (!) 101  Resp: 20 15  Temp:    SpO2: 98%     Last Pain:  Vitals:   07/06/22 1505  TempSrc:   PainSc: 3    Pain Goal:                Epidural/Spinal Function Cutaneous sensation: Able to Wiggle Toes (07/06/22 1515), Patient able to flex knees: Yes (07/06/22 1515), Patient able to lift hips off bed: No (07/06/22 1515), Back pain beyond tenderness at insertion site: No (07/06/22 1515), Progressively worsening motor and/or sensory loss: No (07/06/22 1515), Bowel and/or bladder incontinence post epidural: No (07/06/22 1515)  [name] A.

## 2022-07-06 NOTE — Transfer of Care (Signed)
Immediate Anesthesia Transfer of Care Note  Patient: Traci King  Procedure(s) Performed: POST PARTUM TUBAL LIGATION (Bilateral)  Patient Location: PACU  Anesthesia Type:Epidural  Level of Consciousness: awake  Airway & Oxygen Therapy: Patient Spontanous Breathing  Post-op Assessment: Report given to RN  Post vital signs: Reviewed and stable  Last Vitals:  Vitals Value Taken Time  BP 159/99 07/06/22 1356  Temp    Pulse 73 07/06/22 1358  Resp 7 07/06/22 1358  SpO2 100 % 07/06/22 1358  Vitals shown include unvalidated device data.  Last Pain:  Vitals:   07/06/22 1153  TempSrc: Oral  PainSc: 5          Complications: No notable events documented.

## 2022-07-06 NOTE — Op Note (Signed)
Traci King 07/06/2022  PREOPERATIVE DIAGNOSIS:  Multiparity, undesired fertility  POSTOPERATIVE DIAGNOSIS:  Multiparity, undesired fertility  PROCEDURE:  Postpartum Bilateral Tubal Sterilization using Filshie Clips   SURGEON: Emeterio Reeve, MD  ASSISTANT: Liliane Channel, MD  An experienced assistant was required given the standard of surgical care given the complexity of the case.  This assistant was needed for exposure, dissection, suctioning, retraction, instrument exchange, assisting with delivery with administration of fundal pressure, and for overall help during the procedure.  ANESTHESIA:  Epidural  COMPLICATIONS:  None immediate.  ESTIMATED BLOOD LOSS:  Less than 20 ml.  FLUIDS: 300 ml LR.  URINE OUTPUT:  bladder emptied prior to surgery  INDICATIONS: 32 y.o. [id number]  with undesired fertility,status post vaginal delivery, desires permanent sterilization. Risks and benefits of procedure discussed with patient including permanence of method, bleeding, infection, injury to surrounding organs and need for additional procedures. Risk failure of 0.5-1% with increased risk of ectopic gestation if pregnancy occurs was also discussed with patient.   FINDINGS:  Normal uterus, tubes, and ovaries.  TECHNIQUE:  The patient was taken to the operating room where her epidural anesthesia was dosed up to surgical level and found to be adequate.  She was then placed in the dorsal supine position and prepped and draped in sterile fashion.  After an adequate timeout was performed, attention was turned to the patient's abdomen and 20cc of bupivacaine was administered around the operative site. Following this, a small transverse skin incision was made under the umbilical fold. The incision was taken down to the layer of fascia using the scalpel, and fascia was incised, and extended bilaterally using Mayo scissors. The peritoneum was entered in a sharp fashion. Attention was then turned to the  patient's uterus, and left fallopian tube was identified and followed out to the fimbriated end.  A Filshie clip was placed on the left fallopian tube about 2 cm from the cornual attachment, with care given to incorporate the underlying mesosalpinx.  A similar process was carried out on the right side allowing for bilateral tubal sterilization.  Good hemostasis was noted overall.  Local analgesia was drizzled on both operative sites.The instruments were then removed from the patient's abdomen and the fascial incision was repaired with 0 Vicryl, and the skin was closed with a 3-0 Monocryl subcuticular stitch. The patient tolerated the procedure well.  Sponge, lap, and needle counts were correct times two.  The patient was then taken to the recovery room awake, extubated and in stable condition.  Liliane Channel MD MPH OB Fellow, [place] for [place] 07/06/2022

## 2022-07-07 LAB — BASIC METABOLIC PANEL
Anion gap: 11 (ref 5–15)
BUN: <5 mg/dL — ABNORMAL LOW (ref 6–20)
CO2: 26 mmol/L (ref 22–32)
Calcium: 6.5 mg/dL — ABNORMAL LOW (ref 8.9–10.3)
Chloride: 99 mmol/L (ref 98–111)
Creatinine, Ser: 0.53 mg/dL (ref 0.44–1.00)
GFR, Estimated: >60 mL/min (ref >60)
Glucose, Bld: 82 mg/dL (ref 70–99)
Potassium: 3.2 mmol/L — ABNORMAL LOW (ref 3.5–5.1)
Sodium: 136 mmol/L (ref 135–145)

## 2022-07-07 MED ORDER — PROPOFOL 10 MG/ML IV BOLUS
INTRAVENOUS | Status: AC
  Filled 2022-07-07: qty 20

## 2022-07-07 MED ORDER — OXYCODONE HCL 5 MG PO TABS
5.0000 mg | ORAL_TABLET | ORAL | Status: DC | PRN
  Administered 2022-07-07 – 2022-07-08 (×4): 5 mg via ORAL
  Filled 2022-07-07 (×3): qty 1

## 2022-07-07 MED ORDER — POTASSIUM CHLORIDE CRYS ER 20 MEQ PO TBCR: 20.0000 meq | EXTENDED_RELEASE_TABLET | Freq: Two times a day (BID) | ORAL | Status: DC

## 2022-07-07 MED ORDER — DEXMEDETOMIDINE HCL IN NACL 80 MCG/20ML IV SOLN
INTRAVENOUS | Status: AC
  Filled 2022-07-07: qty 40

## 2022-07-07 NOTE — Progress Notes (Signed)
CSW attempted to meet with MOB to complete assessment. MOB had a visitor present and requested CSW return at a later time. CSW to follow up.   Signed,  Berniece Salines, MSW, [place], [place] 07/07/2022 12:53 PM

## 2022-07-07 NOTE — Clinical Social Work Maternal (Signed)
CLINICAL SOCIAL WORK MATERNAL/CHILD NOTE  Patient Details  Name: Traci King MRN: [id number] Date of Birth: 1991-03-07  Date:  07/07/2022  Clinical Social Worker Initiating Note:  Idamae Lusher, LCSWA Date/Time: Initiated:  07/07/22/1751     Child's Name:  Alroy Bailiff   Biological Parents:  Mother   Need for Interpreter:  None   Reason for Referral:  Current Substance Use/Substance Use During Pregnancy  , Behavioral Health Concerns   Address:  [address] [place] [address]    Phone number:  [phone] (home)     Additional phone number:   Household Members/Support Persons (HM/SP):   Household Member/Support Person 1   HM/SP Name Relationship DOB or Age  HM/SP -1 Fiza Roetman Daughter 11/30/2011  HM/SP -2        HM/SP -3        HM/SP -4        HM/SP -5        HM/SP -6        HM/SP -7        HM/SP -8          Natural Supports (not living in the home):  Extended Family, Immediate Family   Professional Supports: None   Employment: Part-time   Type of Work: [place] [employer]   Education:  [place] arranged:    Museum/gallery curator Resources:  Medicaid   Other Resources:  Physicist, medical  , [place] Considerations Which May Impact Care:  None identified  Strengths:  Ability to meet basic needs  , Home prepared for child  , Pediatrician chosen   Psychotropic Medications:         Pediatrician:    [place] area  Pediatrician List:   [facility] for [place]      Pediatrician Fax Number:    Risk Factors/Current Problems:  None   Cognitive State:  Able to Concentrate  , Alert  , Linear Thinking  , Goal Oriented     Mood/Affect:  Comfortable  , Interested  , Relaxed  , Bright  , Calm     CSW Assessment: CSW was consulted due to documented THC use during pregnancy. CSW met with MOB at bedside to  complete assessment. When CSW entered room, MOB had a vistitor present. CSW introduced self and requested to speak with MOB alone. Visitor left to provide privacy. CSW explained reason for consult. MOB presented as calm, was agreeable to consult and remained engaged throughout encounter.   CSW inquired how MOB is feeling since delivery. MOB shared she is "getting there", sharing that she was diagnosed with pre-eclampsia during pregnancy and has experienced high blood pressure since having a bilateral tubal ligation after delivery. MOB expressed how it has been emotionally difficult to be away from infant while she was on IV magnesium and RN staff cared for infant. CSW normalized MOB's feelings and provided emotional support. CSW inquired about MOB's mental health history. MOB states she had postpartum depression after the birth of her older daughter, who is now 17 years old. MOB recalls having "bad" postpartum depression, marked by crying, feeling sad, and worried often. MOB shares that her symptoms lasted for 1 year and she met with her OBGYN weekly for regular mood checks as treatment. MOB states she was diagnosed with depression and anxiety around 2013 and  her symptoms have been in remission since 2018. MOB states she was prescribed valium in the past but she does not currently take psychotropic medication and does not attend therapy. MOB declined outpatient mental health resources at this time. MOB states she feels comfortable talking about mental health with her OBGYN if concerns arise. MOB identified her mother and aunt as supports. MOB denied current SI/HI/DV.  CSW provided education regarding the baby blues period vs. perinatal mood disorders, discussed treatment and gave resources for mental health follow up if concerns arise.  CSW recommends self-evaluation during the postpartum time period using the New Mom Checklist from Postpartum Progress and encouraged MOB to contact a medical professional if  symptoms are noted at any time.    MOB reports she has a car seat and bassinet for infant. MOB states she was not anticipating infant to wear preemie items so she will need preemie diapers clothing. CSW provided MOB with information for [employer]. CSW inquired about additional resource needs. MOB was agreeable to [employer] food bank information and expressed interest in [facility]) nurse home visiting program. MOB provided verbal consent for CSW to place [facility] referral, which CSW placed. Preemie diapers and wipes were requested in [facility] referral. MOB receives [facility] and food stamps. MOB declined additional resource needs.  CSW provided review of Sudden Infant Death Syndrome (SIDS) precautions.    CSW informed MOB about hospital drug screen policy due to documented use of marijuana during pregnancy. CSW explained that infant's UDS and CDS would be monitored and a CPS report would be made if warranted. MOB expressed understanding. CSW inquired about substance use during pregnancy. MOB states she smoked marijuana last 12/2021 prior to her initial OBGYN appointment. MOB states she smoked marijuana because she had difficulty eating and experienced nausea and vomiting early in pregnancy. MOB denied using other illicit substances during pregnancy. MOB denied transportation barriers. MOB denied a history of CPS involvement.  CSW received consult for hx of marijuana use.  Referral was screened out due to the following: ~MOB had no documented substance use after initial prenatal visit/+UPT. ~MOB had no positive drug screens after initial prenatal visit/+UPT. ~Baby's UDS is negative.  CSW identifies no further need for intervention and no barriers to discharge at this time.  CSW Plan/Description:  No Further Intervention Required/No Barriers to Discharge, Sudden Infant Death Syndrome (SIDS) Education, Other Information/Referral to [employer], Perinatal Mood and Anxiety  Disorder (PMADs) Education, [place], CSW Will Continue to Monitor Umbilical Cord Tissue Drug Screen Results and Make Report if Herma Carson, LCSWA 07/07/2022, 5:54 PM

## 2022-07-07 NOTE — Progress Notes (Signed)
  CRITICAL VALUE STICKER  CRITICAL VALUE: Potassium 2.4  RECEIVER (on-site recipient of call): Rica Mote, RN  DATE & TIME NOTIFIED: 07/06/22 @ 1556  MESSENGER (representative from lab): Ebony Hail  MD NOTIFIED: Dr. Roselie Awkward  TIME OF NOTIFICATION: 1155  RESPONSE: orders placed

## 2022-07-07 NOTE — Progress Notes (Signed)
Post Partum Day 2 s/p SVD, POD1 s/p PPTL with development of PreE with severe features on PPD1  Subjective: up ad lib, voiding, tolerating PO, + flatus, and some increased pain from tubal ligation.   Objective: Blood pressure (!) 156/91, pulse 100, temperature 98 F (36.7 C), temperature source Oral, resp. rate 18, height 5' 3"$  (1.6 m), weight 67 kg, last menstrual period 10/01/2021, SpO2 98 %, unknown if currently breastfeeding.  Physical Exam:  General: alert, cooperative, and no distress Lochia: appropriate Uterine Fundus: firm Incision: healing well DVT Evaluation: No evidence of DVT seen on physical exam.  Recent Labs    07/05/22 1456 07/06/22 1556  HGB 12.5 13.5  HCT 35.1* 37.8    Assessment/Plan: Postpartum - Contraception: BTL - MOF: Both  - Rh status: Pos - Rubella status: Immune - Dispo: PPD3 most likely - Consults: TOC - Will add in Oxycodone for pain relief.   PreE with SF - Discussed magnesium for 24 hours - Procardia 60 mg for first dose today, increased from 72 yesterday. Continue lasix.   Hypokalemia - Improving - will continue potassium PO through tomorrow morning but will not likely need it for home  Neonatal - Doing well and at  bedside   LOS: 2 days   Radene Gunning 07/07/2022, 11:09 AM

## 2022-07-07 NOTE — Progress Notes (Signed)
CSW made attempt to meet with MOB to complete assessment. MOB is now on Magnesium IV until 4pm today (05/07/2023). CSW to follow up after Magnesium infusion is complete.  Signed,  Berniece Salines, MSW, [place], [place] 07/07/2022 12:54 PM

## 2022-07-07 NOTE — Lactation Note (Signed)
This note was copied from a baby's chart. Lactation Consultation Note  Patient Name: Traci King [id number] Date: 07/07/2022 Reason for consult: Initial assessment;Early term 37-38.6wks Age:34 hours  Initial consult per birthing parent request. Birthing parent preference is bottlefeeding expressed breast milk and formula. Parent reports pumping and collecting ~82m of colostrum but discontinued due to cramping. Reinforced the importance of stimulation to establish milk supply. Talked about cramping should improve after 3-5 days from delivery and pain relief should help with discomfort. Reviewed appropriate feeding volume per age.   Encouraged LP rest, hydration and food intake.  All questions answered at this time. Contact LC as needed for feeds/support/concerns/questions. Reviewed LGoldenbrochure and resources available in the community.      Maternal Data Has patient been taught Hand Expression?: No Does the patient have breastfeeding experience prior to this delivery?: Yes How long did the patient breastfeed?: 1 month, 11 years ago.  Feeding Mother's Current Feeding Choice: Breast Milk and Formula Nipple Type: Dr. BClement Husbands LATCH Score Parent states she is exclusively pumping.   Lactation Tools Discussed/Used Tools: Pump Breast pump type: Double-Electric Breast Pump;Manual Reason for Pumping: Mother's preference Pumping frequency: at least 8x24h Pumped volume: 5 mL (collected during last pumping session 07/06/2022)  Interventions Interventions: Breast feeding basics reviewed;Hand pump;DEBP;Expressed milk;Education;LC Services brochure;Skin to skin  Discharge Pump: Personal;DEBP;Manual WIC Program: Yes  Consult Status Consult Status: Follow-up Date: 07/08/22 Follow-up type: In-patient    Traci King 07/07/2022, 5:31 PM

## 2022-07-08 MED ORDER — NIFEDIPINE ER OSMOTIC RELEASE 60 MG PO TB24
60.0000 mg | ORAL_TABLET | Freq: Two times a day (BID) | ORAL | Status: DC
  Administered 2022-07-08: 60 mg via ORAL
  Filled 2022-07-08: qty 1

## 2022-07-08 MED ORDER — NIFEDIPINE ER 60 MG PO TB24: 60.0000 mg | ORAL_TABLET | Freq: Two times a day (BID) | ORAL | 2 refills | Status: DC

## 2022-07-08 MED ORDER — OXYCODONE HCL 5 MG PO TABS: 5.0000 mg | ORAL_TABLET | ORAL | 0 refills | Status: DC | PRN

## 2022-07-08 MED ORDER — FUROSEMIDE 20 MG PO TABS: 20.0000 mg | ORAL_TABLET | Freq: Two times a day (BID) | ORAL | 0 refills | Status: DC

## 2022-07-08 MED ORDER — IBUPROFEN 600 MG PO TABS: 600.0000 mg | ORAL_TABLET | Freq: Four times a day (QID) | ORAL | 0 refills | Status: DC

## 2022-07-08 NOTE — Anesthesia Postprocedure Evaluation (Signed)
Anesthesia Post Note  Patient: Traci King  Procedure(s) Performed: AN AD HOC LABOR EPIDURAL     Patient location during evaluation: Mother Baby Anesthesia Type: Epidural Level of consciousness: oriented and awake and alert Pain management: pain level controlled Vital Signs Assessment: post-procedure vital signs reviewed and stable Respiratory status: spontaneous breathing and nonlabored ventilation Cardiovascular status: blood pressure returned to baseline and stable Postop Assessment: no headache, no backache and epidural receding Anesthetic complications: no  No notable events documented.  Last Vitals: There were no vitals filed for this visit.  Last Pain: There were no vitals filed for this visit.               Crystal Lawns

## 2022-07-08 NOTE — Lactation Note (Signed)
This note was copied from a baby's chart. Lactation Consultation Note  Patient Name: Girl Traci King [id number] Date: 07/08/2022 Reason for consult: Follow-up assessment;Early term 37-38.6wks;Infant < 6lbs Age:34 hours  P2, 37 weeks, < 5 lbs. Mother was interested in attempting to breastfeed. Mother's breasts are filling and baby demonstrated feeding cues.  Reviewed how to hand express.  Mother expressed good flow of transitional breastmilk.  Assisted with latching in cross cradle hold and football hold. Baby sustained latch for 10 min.   If mother is interested in breastfeeding, suggest she offer breast first and then bottle.  Encouraged mother to continue to supplement with formula.  Reviewed manual pump. Since mother seems undecided on how she will feed her baby, reviewed engorgement care and using cabbage leaves if she decides to not breastfeed. Feed baby 8-12 times per day.    Maternal Data Has patient been taught Hand Expression?: Yes  Feeding Mother's Current Feeding Choice: Breast Milk and Formula Nipple Type: Dr. Myra Gianotti Preemie  LATCH Score Latch: Grasps breast easily, tongue down, lips flanged, rhythmical sucking.  Audible Swallowing: A few with stimulation  Type of Nipple: Everted at rest and after stimulation  Comfort (Breast/Nipple): Soft / non-tender  Hold (Positioning): Assistance needed to correctly position infant at breast and maintain latch.  LATCH Score: 8   Lactation Tools Discussed/Used    Interventions Interventions: Breast feeding basics reviewed;Assisted with latch;Hand express;Support pillows;Position options;Education;DEBP;Hand pump  Discharge Discharge Education: Engorgement and breast care;Warning signs for feeding baby Pump: Manual  Consult Status Consult Status: Complete Date: 07/08/22    Vivianne Master [facility] 07/08/2022, 10:50 AM

## 2022-07-09 LAB — SURGICAL PATHOLOGY

## 2022-07-10 ENCOUNTER — Other Ambulatory Visit: Payer: Medicaid Other

## 2022-07-10 ENCOUNTER — Encounter: Payer: Medicaid Other | Admitting: Family Medicine

## 2022-07-12 ENCOUNTER — Ambulatory Visit (INDEPENDENT_AMBULATORY_CARE_PROVIDER_SITE_OTHER): Payer: Medicaid Other | Admitting: *Deleted

## 2022-07-12 VITALS — BP 154/97 | HR 123

## 2022-07-12 DIAGNOSIS — O1495 Unspecified pre-eclampsia, complicating the puerperium: Secondary | ICD-10-CM

## 2022-07-12 DIAGNOSIS — O1490 Unspecified pre-eclampsia, unspecified trimester: Secondary | ICD-10-CM

## 2022-07-12 MED ORDER — LABETALOL HCL 200 MG PO TABS: 200.0000 mg | ORAL_TABLET | Freq: Two times a day (BID) | ORAL | 1 refills | Status: DC

## 2022-07-12 NOTE — Progress Notes (Signed)
Subjective:  Traci King is a 32 y.o. female here for BP check.   Hypertension ROS: Patient denies any headaches, visual symptoms, RUQ/epigastric pain or other concerning symptoms.  Objective:  BP (!) 154/97   Pulse (!) 123   Breastfeeding No   Appearance alert, well appearing, and in no distress. General exam BP noted to be stable today in office.    Assessment:   Blood Pressure needs improvement.   Plan:  Discussed blood pressure with Dr Ilda Basset, will add another medication, and pt to return next week for repeat BP check.  Crosby Oyster, RN

## 2022-07-16 ENCOUNTER — Encounter: Payer: Self-pay | Admitting: Family Medicine

## 2022-07-17 ENCOUNTER — Other Ambulatory Visit: Payer: Self-pay | Admitting: Family Medicine

## 2022-07-17 ENCOUNTER — Ambulatory Visit (INDEPENDENT_AMBULATORY_CARE_PROVIDER_SITE_OTHER): Payer: Medicaid Other | Admitting: *Deleted

## 2022-07-17 VITALS — BP 156/101 | HR 74

## 2022-07-17 DIAGNOSIS — Z0130 Encounter for examination of blood pressure without abnormal findings: Secondary | ICD-10-CM

## 2022-07-17 DIAGNOSIS — O165 Unspecified maternal hypertension, complicating the puerperium: Secondary | ICD-10-CM

## 2022-07-17 DIAGNOSIS — O1490 Unspecified pre-eclampsia, unspecified trimester: Secondary | ICD-10-CM

## 2022-07-17 MED ORDER — LISINOPRIL 10 MG PO TABS: 10.0000 mg | ORAL_TABLET | Freq: Every day | ORAL | 3 refills | Status: DC

## 2022-07-17 NOTE — Progress Notes (Signed)
Subjective:  Traci King is a 32 y.o. female here for BP check.   Hypertension ROS: Patient denies any headaches, visual symptoms, RUQ/epigastric pain or other concerning symptoms.  Objective:  BP (!) 158/94   Pulse 74   Appearance alert, well appearing, and in no distress. General exam BP noted to be unchanged from last week  today in office.    Assessment:   Blood Pressure needs improvement.   Plan:  Discussed with Dr Kennon Rounds, will stop labetalol and add lisinopril and come back in one week for BP check.   Crosby Oyster, RN .

## 2022-07-24 ENCOUNTER — Ambulatory Visit (INDEPENDENT_AMBULATORY_CARE_PROVIDER_SITE_OTHER): Payer: Medicaid Other | Admitting: *Deleted

## 2022-07-24 VITALS — BP 132/80 | HR 106

## 2022-07-24 DIAGNOSIS — O165 Unspecified maternal hypertension, complicating the puerperium: Secondary | ICD-10-CM

## 2022-07-24 DIAGNOSIS — Z0130 Encounter for examination of blood pressure without abnormal findings: Secondary | ICD-10-CM

## 2022-07-24 NOTE — Progress Notes (Signed)
Subjective:  Traci King is a 32 y.o. female here for BP check.   Hypertension ROS: Patient denies any headaches, visual symptoms, RUQ/epigastric pain or other concerning symptoms.  Objective:  BP 132/80 Comment: manual  Pulse (!) 106   Appearance alert, well appearing, and in no distress. General exam BP noted to be improved today in office.    Assessment:   Blood Pressure improved.   Plan:  Follow up as needed and or at postpartum visit  Crosby Oyster, RN .

## 2022-07-26 ENCOUNTER — Inpatient Hospital Stay (HOSPITAL_COMMUNITY): Admit: 2022-07-26 | Payer: Self-pay

## 2022-08-06 ENCOUNTER — Ambulatory Visit (INDEPENDENT_AMBULATORY_CARE_PROVIDER_SITE_OTHER): Payer: Medicaid Other | Admitting: Obstetrics and Gynecology

## 2022-08-06 ENCOUNTER — Encounter: Payer: Self-pay | Admitting: Obstetrics and Gynecology

## 2022-08-06 VITALS — BP 139/88 | HR 106 | Wt 126.0 lb

## 2022-08-06 DIAGNOSIS — F53 Postpartum depression: Secondary | ICD-10-CM

## 2022-08-06 DIAGNOSIS — O165 Unspecified maternal hypertension, complicating the puerperium: Secondary | ICD-10-CM | POA: Insufficient documentation

## 2022-08-06 NOTE — Progress Notes (Signed)
    [place] Partum Visit Note  Traci King is a 32 y.o. [id number] SVD/intact perineum at 37wks after IOL for FGR, mild pre-eclampsia.  Anesthesia: epidural. .Baby is doing well. Baby is feeding by bottle - Similac Neosure. Bleeding staining only. Bowel function is normal. Bladder function is normal. Patient is not sexually active. Contraception method is tubal ligation. Postpartum depression screening: positive.EPDS =15.   Edinburgh Postnatal Depression Scale - 08/06/22 0823       Edinburgh Postnatal Depression Scale:  In the Past 7 Days   I have been able to laugh and see the funny side of things. 1    I have looked forward with enjoyment to things. 1    I have blamed myself unnecessarily when things went wrong. 3    I have been anxious or worried for no good reason. 2    I have felt scared or panicky for no good reason. 1    Things have been getting on top of me. 2    I have been so unhappy that I have had difficulty sleeping. 2    I have felt sad or miserable. 1    I have been so unhappy that I have been crying. 2    The thought of harming myself has occurred to me. 0    Edinburgh Postnatal Depression Scale Total 15            Review of Systems Pertinent items noted in HPI and remainder of comprehensive ROS otherwise negative.  Objective:  BP 139/88   Pulse (!) 106   Wt 126 lb (57.2 kg)   Breastfeeding No   BMI 22.32 kg/m    General: NAD  Assessment:   Normal postpartum visit.   Plan:  *PP: routine care. Referral to [facility] made for EPDS score. Patient has help from mom and also her 68 y/o daughter. Strategies d/w her to help.  PP stretches, exercises d/w her for low back discomfort.  *Mild pre-eclampsia/PP HTN: patient currently taking lisinopril 10mg  qday and procardia xl 60 bid, will continue that for now and re-assess at her annual   RTC 2 months for annual, pap smear Aletha Halim, [place] for [place], [place]

## 2022-08-26 NOTE — BH Specialist Note (Deleted)
Integrated Behavioral Health Initial In-Person Visit  MRN: [id number] Name: LAKELIA BONECUTTER  Number of Integrated Behavioral Health Clinician visits: No data recorded Session Start time: No data recorded   Session End time: No data recorded Total time in minutes: No data recorded  Types of Service: Individual psychotherapy  Interpretor:No. Interpretor Name and Language: n/a   Warm Hand Off Completed.        Subjective: WRENLEE MULRONEY is a 32 y.o. female accompanied by {CHL AMB ACCOMPANIED FS:[id number]} Patient was referred by Coffeen Bing, MD for positive depression screen. Patient reports the following symptoms/concerns: *** Duration of problem: ***; Severity of problem: {Mild/Moderate/Severe:20260}  Objective: Mood: {BHH MOOD:22306} and Affect: {BHH AFFECT:22307} Risk of harm to self or others: {CHL AMB BH Suicide Current Mental Status:21022748}  Life Context: Family and Social: *** School/Work: *** Self-Care: *** Life Changes: Recent childbirth***  Patient and/or Family's Strengths/Protective Factors: {CHL AMB BH PROTECTIVE FACTORS:[phone]}  Goals Addressed: Patient will: Reduce symptoms of: {IBH Symptoms:21014056} Increase knowledge and/or ability of: {IBH Patient Tools:21014057}  Demonstrate ability to: {IBH Goals:21014053}  Progress towards Goals: {CHL AMB BH PROGRESS TOWARDS [phone]}  Interventions: Interventions utilized: {IBH Interventions:21014054}  Standardized Assessments completed: {IBH Screening Tools:21014051}  Patient and/or Family Response: Patient agrees with treatment plan. ***  Patient Centered Plan: Patient is on the following Treatment Plan(s):  IBH  Assessment: Patient currently experiencing ***.   Patient may benefit from psychoeducation and brief therapeutic interventions regarding coping with symptoms of *** .  Plan: Follow up with behavioral health clinician on : *** Behavioral recommendations:   -*** -*** Referral(s): {IBH Referrals:21014055}  Rae Lips, LCSW      08/06/2022    8:23 AM 07/06/2022    7:44 AM  Edinburgh Postnatal Depression Scale Screening Tool  I have been able to laugh and see the funny side of things. 1 0  I have looked forward with enjoyment to things. 1 0  I have blamed myself unnecessarily when things went wrong. 3 1  I have been anxious or worried for no good reason. 2 1  I have felt scared or panicky for no good reason. 1 0  Things have been getting on top of me. 2 1  I have been so unhappy that I have had difficulty sleeping. 2 0  I have felt sad or miserable. 1 0  I have been so unhappy that I have been crying. 2 0  The thought of harming myself has occurred to me. 0 0  Edinburgh Postnatal Depression Scale Total 15 3

## 2022-08-29 ENCOUNTER — Encounter: Payer: Self-pay | Admitting: Obstetrics and Gynecology

## 2022-08-29 ENCOUNTER — Other Ambulatory Visit: Payer: Self-pay | Admitting: *Deleted

## 2022-08-29 MED ORDER — IBUPROFEN 600 MG PO TABS: 600.0000 mg | ORAL_TABLET | Freq: Four times a day (QID) | ORAL | 3 refills | Status: DC | PRN

## 2022-09-03 ENCOUNTER — Institutional Professional Consult (permissible substitution): Payer: Medicaid Other

## 2022-09-06 ENCOUNTER — Ambulatory Visit: Payer: Medicaid Other | Admitting: Cardiology

## 2022-09-09 NOTE — BH Specialist Note (Signed)
Integrated Behavioral Health via Telemedicine Visit  09/13/2022 ROSSI SILVESTRO [id number]  Number of Integrated Behavioral Health Clinician visits: 1- Initial Visit  Session Start time: 1021   Session End time: 1052  Total time in minutes: 31   Referring Provider: Dunlap Bing, MD Patient/Family location: [facility] Provider location: [facility] for [facility]  All persons participating in visit: Patient Traci King and [facility] Traci King   Types of Service: Individual psychotherapy and Telephone visit  I connected with Traci King and/or Traci King's  n/a  via  Telephone or Engineer, civil (consulting)  (Video is Caregility application) and verified that I am speaking with the correct person using two identifiers. Discussed confidentiality: Yes   I discussed the limitations of telemedicine and the availability of in person appointments.  Discussed there is a possibility of technology failure and discussed alternative modes of communication if that failure occurs.  I discussed that engaging in this telemedicine visit, they consent to the provision of behavioral healthcare and the services will be billed under their insurance.  Patient and/or legal guardian expressed understanding and consented to Telemedicine visit: Yes   Presenting Concerns: Patient and/or family reports the following symptoms/concerns: Anxious about preeclampsia and whether or not she should still take BP medication; also concerned about preventing depression postpartum as previously experienced; fatigue. Pt is feeling hopeful with sleep and appetite increasing.  Duration of problem: Postpartum; Severity of problem: moderate  Patient and/or Family's Strengths/Protective Factors: Social connections, Concrete supports in place (healthy food, safe environments, etc.), and Sense of purpose  Goals Addressed: Patient will:  Reduce symptoms  of: anxiety   Increase knowledge and/or ability of: healthy habits   Demonstrate ability to: Increase healthy adjustment to current life circumstances and Increase adequate support systems for patient/family  Progress towards Goals: Ongoing  Interventions: Interventions utilized:  Solution-Focused Strategies, Psychoeducation and/or Health Education, and Link to [employer] Standardized Assessments completed: GAD-7 and PHQ 9  Patient and/or Family Response: Patient agrees with treatment plan.   Assessment: Patient currently experiencing Adjustment disorder with anxious mood.   Patient may benefit from psychoeducation and brief therapeutic interventions regarding coping with symptoms of anxiety .  Plan: Follow up with behavioral health clinician on : Two weeks Behavioral recommendations:  -Continue taking BP medication as prescribed until discussing with medical providers  -Set timers on phone today as reminders to take BP medication daily -Continue prioritizing healthy self-care (regular meals, adequate rest; allowing practical help from supportive friends and family) until at least postpartum medical appointment -Consider new mom support group as needed at either [email or web address] or [email or web address]   Referral(s): Integrated Art gallery manager (In Clinic) and [employer]:  new mom support  I discussed the assessment and treatment plan with the patient and/or parent/guardian. They were provided an opportunity to ask questions and all were answered. They agreed with the plan and demonstrated an understanding of the instructions.   They were advised to call back or seek an in-person evaluation if the symptoms worsen or if the condition fails to improve as anticipated.  Valetta Close Shad Ledvina, LCSW     09/13/2022   10:35 AM 01/15/2022   10:28 AM  Depression screen PHQ 2/9  Decreased Interest 1 0  Down, Depressed, Hopeless 1 1  PHQ - 2 Score 2 1   Altered sleeping 0 3  Tired, decreased energy 1 3  Change in appetite 2 1  Feeling bad or failure about yourself  1 0  Trouble concentrating 0 1  Moving slowly or fidgety/restless 0 1  Suicidal thoughts 0 0  PHQ-9 Score 6 10  Difficult doing work/chores  Not difficult at all      09/13/2022   10:40 AM 01/15/2022   10:28 AM  GAD 7 : Generalized Anxiety Score  Nervous, Anxious, on Edge 0 0  Control/stop worrying 1 0  Worry too much - different things 1 0  Trouble relaxing 0 0  Restless 0 0  Easily annoyed or irritable 0 0  Afraid - awful might happen 0 0  Total GAD 7 Score 2 0  Anxiety Difficulty  Not difficult at all

## 2022-09-13 ENCOUNTER — Ambulatory Visit (INDEPENDENT_AMBULATORY_CARE_PROVIDER_SITE_OTHER): Payer: Medicaid Other | Admitting: Clinical

## 2022-09-13 DIAGNOSIS — F4322 Adjustment disorder with anxiety: Secondary | ICD-10-CM

## 2022-09-13 NOTE — Patient Instructions (Signed)
[facility] [address] [place], [place] [address] [phone] (main office) [phone] (Anyiah Coverdale's office)  New Parent Support Groups [email or web address]   

## 2022-09-16 NOTE — BH Specialist Note (Deleted)
error; rescheduled

## 2022-09-27 ENCOUNTER — Ambulatory Visit: Payer: Medicaid Other

## 2022-09-27 ENCOUNTER — Telehealth: Payer: Self-pay | Admitting: Clinical

## 2022-09-27 NOTE — Telephone Encounter (Signed)
Pt requests to reschedule for virtual visit, 10/08/22 at 8:15am, due to unexpected vehicle repair needed today/at car shop.

## 2022-09-30 NOTE — BH Specialist Note (Deleted)
Integrated Behavioral Health via Telemedicine Visit  09/30/2022 JACQULYNE CARDOSA [id number]  Number of Integrated Behavioral Health Clinician visits: 1- Initial Visit  Session Start time: 1021   Session End time: 1052  Total time in minutes: 31   Referring Provider: [place] Bing, MD Patient/Family location: Home*** [facility] Provider location: Center for [facility]  All persons participating in visit: Patient Ariss Lesch and [facility] Hakan Nudelman ***  Types of Service: {CHL AMB TYPE OF SERVICE:[phone]}  I connected with [name] and/or Forrest J Ades's {family members:20773} via  Telephone or Engineer, civil (consulting)  (Video is Caregility application) and verified that I am speaking with the correct person using two identifiers. Discussed confidentiality: Yes   I discussed the limitations of telemedicine and the availability of in person appointments.  Discussed there is a possibility of technology failure and discussed alternative modes of communication if that failure occurs.  I discussed that engaging in this telemedicine visit, they consent to the provision of behavioral healthcare and the services will be billed under their insurance.  Patient and/or legal guardian expressed understanding and consented to Telemedicine visit: Yes   Presenting Concerns: Patient and/or family reports the following symptoms/concerns: *** Duration of problem: ***; Severity of problem: {Mild/Moderate/Severe:20260}  Patient and/or Family's Strengths/Protective Factors: {CHL AMB BH PROTECTIVE FACTORS:[phone]}  Goals Addressed: Patient will:  Reduce symptoms of: {IBH Symptoms:21014056}   Increase knowledge and/or ability of: {IBH Patient Tools:21014057}   Demonstrate ability to: {IBH Goals:21014053}  Progress towards Goals: Ongoing  Interventions: Interventions utilized:  {IBH Interventions:21014054} Standardized  Assessments completed: {IBH Screening Tools:21014051}  Patient and/or Family Response: Patient agrees with treatment plan. ***  Assessment: Patient currently experiencing ***.   Patient may benefit from continued therapeutic interventions***.  Plan: Follow up with behavioral health clinician on : *** Behavioral recommendations:  -*** -*** Referral(s): {IBH Referrals:21014055}  I discussed the assessment and treatment plan with the patient and/or parent/guardian. They were provided an opportunity to ask questions and all were answered. They agreed with the plan and demonstrated an understanding of the instructions.   They were advised to call back or seek an in-person evaluation if the symptoms worsen or if the condition fails to improve as anticipated.  Rae Lips, LCSW     09/13/2022   10:35 AM 01/15/2022   10:28 AM  Depression screen PHQ 2/9  Decreased Interest 1 0  Down, Depressed, Hopeless 1 1  PHQ - 2 Score 2 1  Altered sleeping 0 3  Tired, decreased energy 1 3  Change in appetite 2 1  Feeling bad or failure about yourself  1 0  Trouble concentrating 0 1  Moving slowly or fidgety/restless 0 1  Suicidal thoughts 0 0  PHQ-9 Score 6 10  Difficult doing work/chores  Not difficult at all      09/13/2022   10:40 AM 01/15/2022   10:28 AM  GAD 7 : Generalized Anxiety Score  Nervous, Anxious, on Edge 0 0  Control/stop worrying 1 0  Worry too much - different things 1 0  Trouble relaxing 0 0  Restless 0 0  Easily annoyed or irritable 0 0  Afraid - awful might happen 0 0  Total GAD 7 Score 2 0  Anxiety Difficulty  Not difficult at all

## 2022-10-02 ENCOUNTER — Other Ambulatory Visit: Payer: Self-pay | Admitting: Obstetrics & Gynecology

## 2022-10-04 ENCOUNTER — Ambulatory Visit: Payer: Medicaid Other | Admitting: Cardiology

## 2022-10-09 ENCOUNTER — Ambulatory Visit (INDEPENDENT_AMBULATORY_CARE_PROVIDER_SITE_OTHER): Payer: Medicaid Other | Admitting: Obstetrics and Gynecology

## 2022-10-09 ENCOUNTER — Other Ambulatory Visit (HOSPITAL_COMMUNITY)
Admission: RE | Admit: 2022-10-09 | Discharge: 2022-10-09 | Disposition: A | Discharge status: Home / Self Care | Payer: Medicaid Other | Source: Ambulatory Visit | Attending: Obstetrics and Gynecology | Admitting: Obstetrics and Gynecology

## 2022-10-09 ENCOUNTER — Encounter: Payer: Self-pay | Admitting: Obstetrics and Gynecology

## 2022-10-09 VITALS — BP 168/110 | HR 103 | Ht 63.0 in | Wt 130.8 lb

## 2022-10-09 DIAGNOSIS — I10 Essential (primary) hypertension: Secondary | ICD-10-CM

## 2022-10-09 DIAGNOSIS — Z124 Encounter for screening for malignant neoplasm of cervix: Secondary | ICD-10-CM

## 2022-10-09 DIAGNOSIS — L680 Hirsutism: Secondary | ICD-10-CM | POA: Diagnosis not present

## 2022-10-09 DIAGNOSIS — Z01419 Encounter for gynecological examination (general) (routine) without abnormal findings: Secondary | ICD-10-CM | POA: Diagnosis not present

## 2022-10-09 DIAGNOSIS — O1490 Unspecified pre-eclampsia, unspecified trimester: Secondary | ICD-10-CM

## 2022-10-09 MED ORDER — NIFEDIPINE ER 30 MG PO TB24: 30.0000 mg | ORAL_TABLET | Freq: Every day | ORAL | 1 refills | Status: DC

## 2022-10-09 MED ORDER — LISINOPRIL 10 MG PO TABS: 10.0000 mg | ORAL_TABLET | Freq: Every day | ORAL | 1 refills | Status: DC

## 2022-10-09 NOTE — Progress Notes (Signed)
Patient presents for Annual.  LMP: 09/30/22 Last pap: Date: 09/2019 Contraception: None, Tubal  Mammogram: Not yet indicated STD Screening: Declines Flu Vaccine : Declines  CC:  Annual   Not taking lisinopril Depression ?  Hairy since last pregnancy

## 2022-10-09 NOTE — Progress Notes (Signed)
Obstetrics and Gynecology Annual Patient Evaluation  Appointment Date: 10/09/2022  OBGYN Clinic: Center for [facility]  Primary Care Provider: None  Chief Complaint:  Chief Complaint  Patient presents with   Gynecologic Exam   History of Present Illness: Traci King is a 32 y.o. African-American [id number] (Patient's last menstrual period was 09/30/2022 (approximate).), seen for the above chief complaint. Her past medical history is significant for postpartum HTN, THC usage.    Patient last seen for PP visit on 3/12. BP 139/88 on procardia xl 60 bid and lisinopril 10 qday and recommended she stay on that until I saw her for annual exam in two months. Patient states she stopped the medications about a month ago b/c she had a lot going on at the time. She denies any chest pain, sob, or neuro s/s  Patient states that she has noticed more coarse, terminal hair since her delivery and notes it on her chest, neck, chin, abdomen and legs; she denies any oily skin  Patient had regular monthly period that just ended and lasted for about 5 days and no problems or issues.   No sexual intercourse since delivery.   Review of Systems: Pertinent items noted in HPI and remainder of comprehensive ROS otherwise negative.   Past Medical History:  Past Medical History:  Diagnosis Date   Abnormal Pap smear    Herpes simplex type 1 infection    History of stomach ulcers    Preeclampsia    Uterine synechiae     Past Surgical History:  Past Surgical History:  Procedure Laterality Date   TOOTH EXTRACTION     TUBAL LIGATION Bilateral 07/06/2022   Procedure: POST PARTUM TUBAL LIGATION;  Surgeon: Adam Phenix, MD;  Location: [facility] LD [facility]S;  Service: Gynecology;  Laterality: Bilateral;    Past Obstetrical History:  OB History  Gravida Para Term Preterm AB Living  3 2 1 1 1 2   SAB IAB Ectopic Multiple Live Births  0 1 0 0 2    # Outcome Date GA Lbr Len/2nd Weight Sex  Delivery Anes PTL Lv  3 Term 07/05/22 [redacted]w[redacted]d 06:15 / 00:06 4 lb 13.6 oz (2.2 kg) F Vag-Spont EPI  LIV  2 Preterm 11/30/11 [redacted]w[redacted]d 29:26 / 00:14 5 lb 6.8 oz (2.46 kg) F Vag-Spont EPI  LIV  1 IAB             Past Gynecological History: As per HPI. History of Pap Smear(s): Yes.   Last pap 2021, which was wnl She is currently using bilateral tubal ligation for contraception.   Social History:  Social History   Socioeconomic History   Marital status: Single    Spouse name: Not on file   Number of children: Not on file   Years of education: Not on file   Highest education level: Not on file  Occupational History   Not on file  Tobacco Use   Smoking status: Former    Packs/day: .25    Types: Cigarettes    Quit date: 04/09/2011    Years since quitting: 11.5    Passive exposure: Current   Smokeless tobacco: Never  Vaping Use   Vaping Use: Never used  Substance and Sexual Activity   Alcohol use: No   Drug use: Not Currently    Types: Marijuana    Comment: daily   Sexual activity: Not Currently    Partners: Male    Birth control/protection: None  Other Topics Concern   Not  on file  Social History Narrative   Not on file   Social Determinants of Health   Financial Resource Strain: Not on file  Food Insecurity: No Food Insecurity (07/05/2022)   Hunger Vital Sign    Worried About Running Out of Food in the Last Year: Never true    Ran Out of Food in the Last Year: Never true  Transportation Needs: No Transportation Needs (07/05/2022)   PRAPARE - Administrator, Civil Service (Medical): No    Lack of Transportation (Non-Medical): No  Physical Activity: Not on file  Stress: Not on file  Social Connections: Not on file  Intimate Partner Violence: Not At Risk (07/07/2022)   Humiliation, Afraid, Rape, and Kick questionnaire    Fear of Current or Ex-Partner: No    Emotionally Abused: No    Physically Abused: No    Sexually Abused: No    Family History:  Family  History  Problem Relation Age of Onset   Asthma Mother    Hypertension Mother    Diabetes Mother    Hypertension Maternal Grandmother    Diabetes Maternal Grandfather    Cancer Neg Hx    Heart disease Neg Hx     Medications None   Allergies Patient has no known allergies.   Physical Exam:  Initial BP 173/119 BP (!) 168/110   Pulse (!) 103   Ht 5\' 3"  (1.6 m)   Wt 130 lb 12.8 oz (59.3 kg)   LMP 09/30/2022 (Approximate)   BMI 23.17 kg/m  Body mass index is 23.17 kg/m. General appearance: Well nourished, well developed female in no acute distress.  Cardiovascular: normal s1 and s2.  No murmurs, rubs or gallops. Respiratory:  Clear to auscultation bilateral. Normal respiratory effort Abdomen: positive bowel sounds and no masses, hernias; diffusely non tender to palpation, non distended Breasts: breasts appear normal, no suspicious masses, no skin or nipple changes or axillary nodes, and normal palpation. Neuro/Psych:  Normal mood and affect.  Skin:  Warm and dry. Significant amount of terminal hair on her chin, neck, chest, abdomen and legs Lymphatic:  No inguinal lymphadenopathy.   Cervical exam performed in the presence of a chaperone Pelvic exam: is not limited by body habitus EGBUS: within normal limits Vagina: within normal limits and with no blood or discharge in the vault Cervix: normal appearing cervix without tenderness, discharge or lesions. Uterus:  nonenlarged and non tender Adnexa:  normal adnexa and no mass, fullness, tenderness Rectovaginal: deferred  Laboratory: none  Radiology: none  Assessment: patient stable  Plan: 1. Well woman exam with routine gynecological exam - Cytology - PAP - Comprehensive metabolic panel - TSH Rfx on Abnormal to Free T4 - Magnesium - CBC - Ambulatory Referral to Primary Care - TSH+Prl+TestT+TestF+17OHP - Hemoglobin A1c - DHEA-sulfate - Beta hCG quant (ref lab) - Lipid panel  2. Cervical cancer screening -  Cytology - PAP  3. Hypertension, unspecified type PCP appointment made for patient for tomorrow. Recommended she stay off meds for now until seen. ED precautions given.  - Comprehensive metabolic panel - TSH Rfx on Abnormal to Free T4 - Magnesium - CBC - Ambulatory Referral to Primary Care - Urinalysis, Routine w reflex microscopic  4. Hirsutism Given significant amount, referral made to dermatology. Will check PCOS labs - TSH+Prl+TestT+TestF+17OHP - DHEA-sulfate - Beta hCG quant (ref lab) - Follicle stimulating hormone - Ambulatory referral to Dermatology  RTC PRN   Cornelia Copa MD Attending Center for [employer] (Faculty  Practice)

## 2022-10-10 ENCOUNTER — Encounter (HOSPITAL_BASED_OUTPATIENT_CLINIC_OR_DEPARTMENT_OTHER): Payer: Self-pay | Admitting: Family Medicine

## 2022-10-10 ENCOUNTER — Ambulatory Visit (HOSPITAL_BASED_OUTPATIENT_CLINIC_OR_DEPARTMENT_OTHER): Payer: Medicaid Other | Admitting: Family Medicine

## 2022-10-10 VITALS — BP 162/109 | HR 88 | Ht 63.0 in | Wt 133.7 lb

## 2022-10-10 DIAGNOSIS — Z3A00 Weeks of gestation of pregnancy not specified: Secondary | ICD-10-CM | POA: Diagnosis not present

## 2022-10-10 DIAGNOSIS — Z7689 Persons encountering health services in other specified circumstances: Secondary | ICD-10-CM

## 2022-10-10 DIAGNOSIS — O165 Unspecified maternal hypertension, complicating the puerperium: Secondary | ICD-10-CM

## 2022-10-10 DIAGNOSIS — F419 Anxiety disorder, unspecified: Secondary | ICD-10-CM

## 2022-10-10 MED ORDER — AMLODIPINE BESYLATE 2.5 MG PO TABS: 2.5000 mg | ORAL_TABLET | Freq: Every day | ORAL | 0 refills | Status: DC

## 2022-10-10 NOTE — BH Specialist Note (Signed)
Integrated Behavioral Health via Telemedicine Visit  10/17/2022 [name] [id number]  Number of Integrated Behavioral Health Clinician visits: 2- Second Visit  Session Start time: 0821   Session End time: 0841  Total time in minutes: 20   Referring Provider:  Bing, MD Patient/Family location: Home Traci King Provider location: [facility]  All persons participating in visit: Patient Traci King and [facility] Traci King   Types of Service: Individual psychotherapy and Video visit  I connected with Traci King and/or Traci King's  n/a  via  Telephone or Engineer, civil (consulting)  (Video is Caregility application) and verified that I am speaking with the correct person using two identifiers. Discussed confidentiality: Yes   I discussed the limitations of telemedicine and the availability of in person appointments.  Discussed there is a possibility of technology failure and discussed alternative modes of communication if that failure occurs.  I discussed that engaging in this telemedicine visit, they consent to the provision of behavioral healthcare and the services will be billed under their insurance.  Patient and/or legal guardian expressed understanding and consented to Telemedicine visit: Yes   Presenting Concerns: Patient and/or family reports the following symptoms/concerns: Concern over hirsutism, but hopeful after dermatology referral, as well as improved mood after baby is no longer colicky. Pt feels she is coping well at this time with no other questions or concerns.  Duration of problem: Postpartum; Severity of problem: mild  Patient and/or Family's Strengths/Protective Factors: Social connections, Concrete supports in place (healthy food, safe environments, etc.), Sense of purpose, and Physical Health (exercise, healthy diet, medication compliance, etc.)  Goals  Addressed: Patient will:  Maintain reduction of  symptoms of: anxiety, depression, and stress   Progress towards Goals: Achieved  Interventions: Interventions utilized:  Supportive Reflection Standardized Assessments completed: Not Needed  Patient and/or Family Response: Patient agrees with treatment plan.   Assessment: Patient currently experiencing Adjustment disorder with anxious mood.   Patient may benefit from continued therapeutic interventions today.  Plan: Follow up with behavioral health clinician on : Call Traci King at [phone], as needed. Behavioral recommendations:  -Continue prioritizing healthy self-care daily; attending medical appointments as scheduled -Consider new mom support group as needed at either [email or web address] or [email or web address]   Referral(s): Integrated [employer] (In Clinic)  I discussed the assessment and treatment plan with the patient and/or parent/guardian. They were provided an opportunity to ask questions and all were answered. They agreed with the plan and demonstrated an understanding of the instructions.   They were advised to call back or seek an in-person evaluation if the symptoms worsen or if the condition fails to improve as anticipated.  Traci Close [name], LCSW     10/10/2022    1:14 PM 10/09/2022   10:43 AM 09/13/2022   10:35 AM 01/15/2022   10:28 AM  Depression screen PHQ 2/9  Decreased Interest 0 0 1 0  Down, Depressed, Hopeless 0 1 1 1   PHQ - 2 Score 0 1 2 1   Altered sleeping 0 0 0 3  Tired, decreased energy 1 1 1 3   Change in appetite 1 1 2 1   Feeling bad or failure about yourself  1 2 1  0  Trouble concentrating 0 0 0 1  Moving slowly or fidgety/restless 0 0 0 1  Suicidal thoughts 0 0 0 0  PHQ-9 Score 3 5 6 10   Difficult doing work/chores Not difficult at all   Not difficult  at all      10/10/2022    1:14 PM 10/09/2022   10:42 AM 09/13/2022   10:40 AM 01/15/2022   10:28 AM  GAD 7 : Generalized  Anxiety Score  Nervous, Anxious, on Edge 1 1 0 0  Control/stop worrying 0 1 1 0  Worry too much - different things 1 1 1  0  Trouble relaxing 0 1 0 0  Restless 0 0 0 0  Easily annoyed or irritable 1 1 0 0  Afraid - awful might happen 0 0 0 0  Total GAD 7 Score 3 5 2  0  Anxiety Difficulty Not difficult at all   Not difficult at all

## 2022-10-10 NOTE — Progress Notes (Signed)
New Patient Office Visit  Subjective    Patient ID: Traci King, female    DOB: 07/01/1990  Age: 32 y.o. MRN: [id number]  HPI Traci King is a 32 yo female who presents to establish care.  Former PCP: over 10 years  Has been seeing her OBGYN annually   HTN-  Sometimes a little headache here and there  No CP, shob, dizziness, change in vision  Labs were performed by her OBGYN for PCOS She states that it is "getting worse and noticing more hair"  Reports she has had some mood issues during her postpartum period- her daughter is currently 52 months and 16 years old. She does counseling with Jamie (LCSW)     10/10/2022    1:14 PM 10/09/2022   10:42 AM 09/13/2022   10:40 AM 01/15/2022   10:28 AM  GAD 7 : Generalized Anxiety Score  Nervous, Anxious, on Edge 1 1 0 0  Control/stop worrying 0 1 1 0  Worry too much - different things 1 1 1  0  Trouble relaxing 0 1 0 0  Restless 0 0 0 0  Easily annoyed or irritable 1 1 0 0  Afraid - awful might happen 0 0 0 0  Total GAD 7 Score 3 5 2  0  Anxiety Difficulty Not difficult at all   Not difficult at all      10/10/2022    1:14 PM 10/09/2022   10:43 AM 09/13/2022   10:35 AM 01/15/2022   10:28 AM  Depression screen PHQ 2/9  Decreased Interest 0 0 1 0  Down, Depressed, Hopeless 0 1 1 1   PHQ - 2 Score 0 1 2 1   Altered sleeping 0 0 0 3  Tired, decreased energy 1 1 1 3   Change in appetite 1 1 2 1   Feeling bad or failure about yourself  1 2 1  0  Trouble concentrating 0 0 0 1  Moving slowly or fidgety/restless 0 0 0 1  Suicidal thoughts 0 0 0 0  PHQ-9 Score 3 5 6 10   Difficult doing work/chores Not difficult at all   Not difficult at all      Outpatient Encounter Medications as of 10/10/2022  Medication Sig   amLODipine (NORVASC) 2.5 MG tablet Take 1 tablet (2.5 mg total) by mouth daily.   [DISCONTINUED] albuterol (PROVENTIL HFA;VENTOLIN HFA) 108 (90 BASE) MCG/ACT inhaler Inhale 1-2 puffs into the lungs every 6 (six) hours as  needed for wheezing. (Patient not taking: Reported on 05/29/2017)   [DISCONTINUED] Prenatal 28-0.8 MG TABS Take 1 tablet by mouth daily. (Patient not taking: Reported on 10/09/2022)   [DISCONTINUED] valACYclovir (VALTREX) 1000 MG tablet Take 1 tablet (1,000 mg total) by mouth daily. (Patient not taking: Reported on 08/06/2022)   No facility-administered encounter medications on file as of 10/10/2022.    Past Medical History:  Diagnosis Date   Abnormal Pap smear    Herpes simplex type 1 infection    History of stomach ulcers    Preeclampsia    Uterine synechiae     Past Surgical History:  Procedure Laterality Date   TOOTH EXTRACTION     TUBAL LIGATION Bilateral 07/06/2022   Procedure: POST PARTUM TUBAL LIGATION;  Surgeon: Adam Phenix, MD;  Location: [facility] LD ORS;  Service: Gynecology;  Laterality: Bilateral;    Family History  Problem Relation Age of Onset   Asthma Mother    Hypertension Mother    Diabetes Mother    Hypertension Maternal  Grandmother    Diabetes Maternal Grandfather    Cancer Neg Hx    Heart disease Neg Hx     Social History   Socioeconomic History   Marital status: Single    Spouse name: Not on file   Number of children: Not on file   Years of education: Not on file   Highest education level: Not on file  Occupational History   Not on file  Tobacco Use   Smoking status: Former    Packs/day: .25    Types: Cigarettes    Quit date: 04/09/2011    Years since quitting: 11.5    Passive exposure: Current   Smokeless tobacco: Never  Vaping Use   Vaping Use: Never used  Substance and Sexual Activity   Alcohol use: No   Drug use: Not Currently    Types: Marijuana    Comment: daily   Sexual activity: Not Currently    Partners: Male    Birth control/protection: Surgical  Other Topics Concern   Not on file  Social History Narrative   Not on file   Social Determinants of Health   Financial Resource Strain: Not on file  Food Insecurity: No Food  Insecurity (07/05/2022)   Hunger Vital Sign    Worried About Running Out of Food in the Last Year: Never true    Ran Out of Food in the Last Year: Never true  Transportation Needs: No Transportation Needs (07/05/2022)   PRAPARE - Administrator, Civil Service (Medical): No    Lack of Transportation (Non-Medical): No  Physical Activity: Not on file  Stress: Not on file  Social Connections: Not on file  Intimate Partner Violence: Not At Risk (07/07/2022)   Humiliation, Afraid, Rape, and Kick questionnaire    Fear of Current or Ex-Partner: No    Emotionally Abused: No    Physically Abused: No    Sexually Abused: No    Review of Systems  Constitutional:  Negative for malaise/fatigue.  Eyes:  Negative for blurred vision and double vision.  Respiratory:  Negative for cough and shortness of breath.   Cardiovascular:  Negative for chest pain and palpitations.  Gastrointestinal:  Negative for abdominal pain, nausea and vomiting.  Musculoskeletal:  Negative for myalgias.  Neurological:  Positive for headaches (occ). Negative for dizziness and weakness.  Psychiatric/Behavioral:  Negative for depression and suicidal ideas.    Objective    BP (!) 162/109 Comment: Repeat BP  Pulse 88   Ht 5\' 3"  (1.6 m)   Wt 133 lb 11.2 oz (60.6 kg)   LMP 09/30/2022 (Approximate)   SpO2 100%   BMI 23.68 kg/m   Physical Exam Constitutional:      Appearance: Normal appearance.  Cardiovascular:     Rate and Rhythm: Normal rate and regular rhythm.     Pulses: Normal pulses.     Heart sounds: Normal heart sounds.  Pulmonary:     Effort: Pulmonary effort is normal.     Breath sounds: Normal breath sounds.  Musculoskeletal:     Right lower leg: No edema.     Left lower leg: No edema.  Neurological:     Mental Status: She is alert.  Psychiatric:        Mood and Affect: Mood normal.        Behavior: Behavior normal.        Thought Content: Thought content normal.        Judgment: Judgment  normal.    Assessment &  Plan:  1. Encounter to establish care Patient presents today to establish care with primary care provider. Reports she has not been to a PCP in "a long time." She was being followed by her OBGYN with her annual exams and lab work. Recent lab work completed 10/09/2022 by [employer] provider.   2. Postpartum hypertension Hypertension Patient presents today with uncontrolled blood pressure. Patient in no acute distress and is well-appearing. Denies chest pain, palpitations, changes in vision, shortness of breath, lower extremity edema, current headache, lightheadedness, weakness, cough.  Reports she has noticed experiencing occasional headaches. Cardiovascular exam with heart regular rate and rhythm. Normal heart sounds, no murmurs present. No lower extremity edema present. Lungs clear to auscultation bilaterally. Patient was taking Procardia 60mg  BID but ran out of this medication about a month ago. She was also taking lisinopril 10mg  daily but has not been taking this medication either. Will prescribe 2.5mg  daily. Advised patient to closely monitor blood pressure at home. Advised patient to increase to 5mg  after 2 weeks if her blood pressure continues to remain elevated. Follow-up in 4 weeks.    - amLODipine (NORVASC) 2.5 MG tablet; Take 1 tablet (2.5 mg total) by mouth daily.  Dispense: 90 tablet; Refill: 0  3. Anxiety Patient participates in psychotherapy for anxiety. She reports her anxiety does not impact her daily life. Denies SI/HI. She is not interested medication at this time. GAD7 and PHQ2/9 completed. Advised her to monitor her mood and return to office sooner if anxiety/depression symptoms worsen.    Return in about 4 weeks (around 11/07/2022) for HTN follow-up.   Alyson Reedy, FNP

## 2022-10-11 ENCOUNTER — Encounter (HOSPITAL_BASED_OUTPATIENT_CLINIC_OR_DEPARTMENT_OTHER): Payer: Self-pay | Admitting: Family Medicine

## 2022-10-11 DIAGNOSIS — Z7689 Persons encountering health services in other specified circumstances: Secondary | ICD-10-CM | POA: Insufficient documentation

## 2022-10-11 DIAGNOSIS — F419 Anxiety disorder, unspecified: Secondary | ICD-10-CM | POA: Insufficient documentation

## 2022-10-14 LAB — URINALYSIS, ROUTINE W REFLEX MICROSCOPIC
Bilirubin, UA: NEGATIVE
Glucose, UA: NEGATIVE
Ketones, UA: NEGATIVE
Leukocytes,UA: NEGATIVE
Nitrite, UA: NEGATIVE
Protein,UA: NEGATIVE
RBC, UA: NEGATIVE
Specific Gravity, UA: 1.009 (ref 1.005–1.030)
Urobilinogen, Ur: 0.2 mg/dL (ref 0.2–1.0)
pH, UA: 7 (ref 5.0–7.5)

## 2022-10-14 LAB — BETA HCG QUANT (REF LAB): hCG Quant: <1 m[IU]/mL

## 2022-10-14 LAB — CYTOLOGY - PAP
Comment: NEGATIVE
Diagnosis: UNDETERMINED — AB
High risk HPV: NEGATIVE

## 2022-10-14 LAB — HEMOGLOBIN A1C
Est. average glucose Bld gHb Est-mCnc: 97 mg/dL
Hgb A1c MFr Bld: 5 % (ref 4.8–5.6)

## 2022-10-14 LAB — TSH RFX ON ABNORMAL TO FREE T4: TSH: 0.89 u[IU]/mL (ref 0.450–4.500)

## 2022-10-14 LAB — DHEA-SULFATE: DHEA-SO4: 185 ug/dL (ref 84.8–378.0)

## 2022-10-14 LAB — COMPREHENSIVE METABOLIC PANEL
ALT: 15 IU/L (ref 0–32)
AST: 18 IU/L (ref 0–40)
Albumin/Globulin Ratio: 1.8 (ref 1.2–2.2)
Albumin: 4.9 g/dL (ref 3.9–4.9)
Alkaline Phosphatase: 70 IU/L (ref 44–121)
BUN/Creatinine Ratio: 15 (ref 9–23)
BUN: 11 mg/dL (ref 6–20)
Bilirubin Total: 0.3 mg/dL (ref 0.0–1.2)
CO2: 21 mmol/L (ref 20–29)
Calcium: 9.6 mg/dL (ref 8.7–10.2)
Chloride: 104 mmol/L (ref 96–106)
Creatinine, Ser: 0.73 mg/dL (ref 0.57–1.00)
Globulin, Total: 2.7 g/dL (ref 1.5–4.5)
Glucose: 73 mg/dL (ref 70–99)
Potassium: 4.7 mmol/L (ref 3.5–5.2)
Sodium: 141 mmol/L (ref 134–144)
Total Protein: 7.6 g/dL (ref 6.0–8.5)
eGFR: 111 mL/min/{1.73_m2} (ref >59)

## 2022-10-14 LAB — TSH+PRL+TESTT+TESTF+17OHP
17-Hydroxyprogesterone: 36 ng/dL
Prolactin: 10.7 ng/mL (ref 4.8–33.4)
Testosterone, Free: 1 pg/mL (ref 0.0–4.2)
Testosterone, Total, LC/MS: 22.3 ng/dL (ref 10.0–55.0)

## 2022-10-14 LAB — CBC
Hematocrit: 43.9 % (ref 34.0–46.6)
Hemoglobin: 14.6 g/dL (ref 11.1–15.9)
MCH: 30.3 pg (ref 26.6–33.0)
MCHC: 33.3 g/dL (ref 31.5–35.7)
MCV: 91 fL (ref 79–97)
Platelets: 283 10*3/uL (ref 150–450)
RBC: 4.82 x10E6/uL (ref 3.77–5.28)
RDW: 12.5 % (ref 11.7–15.4)
WBC: 5.7 10*3/uL (ref 3.4–10.8)

## 2022-10-14 LAB — MAGNESIUM: Magnesium: 2.2 mg/dL (ref 1.6–2.3)

## 2022-10-14 LAB — LIPID PANEL
Chol/HDL Ratio: 3.1 ratio (ref 0.0–4.4)
Cholesterol, Total: 167 mg/dL (ref 100–199)
HDL: 54 mg/dL (ref >39)
LDL Chol Calc (NIH): 96 mg/dL (ref 0–99)
Triglycerides: 95 mg/dL (ref 0–149)
VLDL Cholesterol Cal: 17 mg/dL (ref 5–40)

## 2022-10-14 LAB — FOLLICLE STIMULATING HORMONE: FSH: 8.1 m[IU]/mL

## 2022-10-15 ENCOUNTER — Encounter: Payer: Self-pay | Admitting: Obstetrics and Gynecology

## 2022-10-17 ENCOUNTER — Ambulatory Visit (INDEPENDENT_AMBULATORY_CARE_PROVIDER_SITE_OTHER): Payer: Medicaid Other | Admitting: Clinical

## 2022-10-17 DIAGNOSIS — F4322 Adjustment disorder with anxiety: Secondary | ICD-10-CM | POA: Diagnosis not present

## 2022-10-17 NOTE — Patient Instructions (Signed)
[facility] [address] [place], [place] [address] [phone] (main office) [phone] (Brien Lowe's office)   

## 2022-10-23 ENCOUNTER — Telehealth: Payer: Self-pay

## 2022-10-23 NOTE — Telephone Encounter (Signed)
Called pt to notify of Dermatology appointment. Pt verified date/time

## 2022-11-08 ENCOUNTER — Ambulatory Visit (HOSPITAL_BASED_OUTPATIENT_CLINIC_OR_DEPARTMENT_OTHER): Payer: Medicaid Other | Admitting: Family Medicine

## 2022-11-15 ENCOUNTER — Ambulatory Visit
Admission: EM | Admit: 2022-11-15 | Discharge: 2022-11-15 | Disposition: A | Discharge status: Home / Self Care | Payer: Medicaid Other | Attending: Urgent Care | Admitting: Urgent Care

## 2022-11-15 DIAGNOSIS — L249 Irritant contact dermatitis, unspecified cause: Secondary | ICD-10-CM | POA: Diagnosis not present

## 2022-11-15 MED ORDER — CEPHALEXIN 500 MG PO CAPS: 500.0000 mg | ORAL_CAPSULE | Freq: Three times a day (TID) | ORAL | 0 refills | Status: DC

## 2022-11-15 MED ORDER — TRIAMCINOLONE ACETONIDE 0.1 % EX CREA: 1.0000 | TOPICAL_CREAM | Freq: Two times a day (BID) | CUTANEOUS | 0 refills | Status: DC

## 2022-11-15 NOTE — ED Provider Notes (Signed)
[facility]  Note:  This document was prepared using Conservation officer, historic buildings and may include unintentional dictation errors.  MRN: [id number] DOB: 1990-06-12  Subjective:   Traci King is a 32 y.o. female presenting for 3 day history of worsening 2 bites to the left arm/flexor surface of the elbow. Had a lot of itching initially, but not feels more pain, inflammation. No drainage of pus or bleeding.   No current facility-administered medications for this encounter.  Current Outpatient Medications:    amLODipine (NORVASC) 2.5 MG tablet, Take 1 tablet (2.5 mg total) by mouth daily., Disp: 90 tablet, Rfl: 0   NIFEdipine (ADALAT CC) 60 MG 24 hr tablet, TAKE 1 TABLET (60 MG TOTAL) BY MOUTH DAILY. CAN INCREASE TO TWICE A DAY AS NEEDED FOR SYMPTOMATIC CONTRACTIONS, Disp: 180 tablet, Rfl: 1   NIFEdipine (PROCARDIA-XL/NIFEDICAL-XL) 30 MG 24 hr tablet, TAKE 1 TABLET BY MOUTH EVERY DAY, Disp: 90 tablet, Rfl: 1   No Known Allergies  Past Medical History:  Diagnosis Date   Abnormal Pap smear    Herpes simplex type 1 infection    History of stomach ulcers    Preeclampsia    Uterine synechiae      Past Surgical History:  Procedure Laterality Date   TOOTH EXTRACTION     TUBAL LIGATION Bilateral 07/06/2022   Procedure: POST PARTUM TUBAL LIGATION;  Surgeon: Adam Phenix, MD;  Location: [facility] LD ORS;  Service: Gynecology;  Laterality: Bilateral;    Family History  Problem Relation Age of Onset   Asthma Mother    Hypertension Mother    Diabetes Mother    Hypertension Maternal Grandmother    Diabetes Maternal Grandfather    Cancer Neg Hx    Heart disease Neg Hx     Social History   Tobacco Use   Smoking status: Former    Packs/day: .25    Types: Cigarettes    Quit date: 04/09/2011    Years since quitting: 11.6    Passive exposure: Current   Smokeless tobacco: Never  Vaping Use   Vaping Use: Never used  Substance Use Topics   Alcohol use:  No   Drug use: Not Currently    Types: Marijuana    ROS   Objective:   Vitals: BP (!) 157/105 (BP Location: Right Arm)   Pulse 86   Temp 99 F (37.2 C) (Oral)   Resp 16   LMP 11/08/2022   SpO2 95%   Physical Exam Constitutional:      General: She is not in acute distress.    Appearance: Normal appearance. She is well-developed. She is not ill-appearing, toxic-appearing or diaphoretic.  HENT:     Head: Normocephalic and atraumatic.     Nose: Nose normal.     Mouth/Throat:     Mouth: Mucous membranes are moist.  Eyes:     General: No scleral icterus.       Right eye: No discharge.        Left eye: No discharge.     Extraocular Movements: Extraocular movements intact.  Cardiovascular:     Rate and Rhythm: Normal rate.  Pulmonary:     Effort: Pulmonary effort is normal.  Skin:    General: Skin is warm and dry.       Neurological:     General: No focal deficit present.     Mental Status: She is alert and oriented to person, place, and time.  Psychiatric:  Mood and Affect: Mood normal.        Behavior: Behavior normal.     Assessment and Plan :   PDMP not reviewed this encounter.  1. Irritant dermatitis    Recommended triamcinolone cream for irritant dermatitis, cephalexin for secondary infection.  Counseled patient on potential for adverse effects with medications prescribed/recommended today, ER and return-to-clinic precautions discussed, patient verbalized understanding.    Wallis Bamberg, PA-C 11/15/22 1302

## 2022-11-15 NOTE — ED Triage Notes (Signed)
Pt with ?insect bite 2 to RUE and 1 to RLE-first noticed 3 days ago-NAD-steady gait

## 2022-11-15 NOTE — Discharge Instructions (Signed)
Apply the steroid cream to the bug bites twice daily for 1 week. Use cephalexin to address a secondary skin infection from scratching and inflammation to the areas.

## 2022-11-19 ENCOUNTER — Ambulatory Visit (INDEPENDENT_AMBULATORY_CARE_PROVIDER_SITE_OTHER): Payer: Medicaid Other | Admitting: Family Medicine

## 2022-11-19 VITALS — BP 150/90 | HR 88 | Ht 63.0 in | Wt 130.1 lb

## 2022-11-19 DIAGNOSIS — I10 Essential (primary) hypertension: Secondary | ICD-10-CM

## 2022-11-19 MED ORDER — HYDROCHLOROTHIAZIDE 12.5 MG PO TABS: 12.5000 mg | ORAL_TABLET | Freq: Every day | ORAL | 3 refills | Status: DC

## 2022-11-19 NOTE — Progress Notes (Signed)
Established Patient Office Visit     Subjective:   Traci King [birth year] 11/19/2022  Chief Complaint  Patient presents with   Hypertension    Pt is here for a follow up for BP.    HPI: Traci King presents today for re-assessment and management of chronic medical conditions.  HYPERTENSION: Traci King presents for the medical management of hypertension.  Patient's current hypertension medication regimen is: Amlodipine 2.5mg   Patient is  currently taking prescribed medications for HTN.  Patient is not regularly keeping a check on BP at home.  Adhering to low sodium diet: yes Exercising Regularly: yes Denies dizziness, CP, SHOB, vision changes.   Reports she has had a headache with starting the Amlodipine.   Manual BP by provider today:  150/90  BP Readings from Last 3 Encounters:  11/19/22 (!) 150/90  11/15/22 (!) 157/105  10/10/22 (!) 162/109     The following portions of the patient's history were reviewed and updated as appropriate: past medical history, past surgical history, family history, social history, allergies, medications, and problem list.   Patient Active Problem List   Diagnosis Date Noted   Anxiety 10/11/2022   Encounter to establish care 10/11/2022   Chronic hypertension 10/09/2022   Postpartum hypertension 08/06/2022   Marijuana abuse 01/15/2022   Cannabinoid hyperemesis syndrome 01/15/2022   Past Medical History:  Diagnosis Date   Abnormal Pap smear    Herpes simplex type 1 infection    History of stomach ulcers    Preeclampsia    Uterine synechiae    Past Surgical History:  Procedure Laterality Date   TOOTH EXTRACTION     TUBAL LIGATION Bilateral 07/06/2022   Procedure: POST PARTUM TUBAL LIGATION;  Surgeon: Adam Phenix, MD;  Location: [facility];  Service: Gynecology;  Laterality: Bilateral;   Family History  Problem Relation Age of Onset   Asthma Mother    Hypertension Mother    Diabetes Mother     Hypertension Maternal Grandmother    Diabetes Maternal Grandfather    Cancer Neg Hx    Heart disease Neg Hx    Outpatient Medications Prior to Visit  Medication Sig Dispense Refill   cephALEXin (KEFLEX) 500 MG capsule Take 1 capsule (500 mg total) by mouth 3 (three) times daily. 21 capsule 0   ibuprofen (ADVIL) 600 MG tablet Take 600 mg by mouth every 6 (six) hours as needed.     amLODipine (NORVASC) 2.5 MG tablet Take 1 tablet (2.5 mg total) by mouth daily. 90 tablet 0   albuterol (PROVENTIL HFA;VENTOLIN HFA) 108 (90 BASE) MCG/ACT inhaler Inhale 1-2 puffs into the lungs every 6 (six) hours as needed for wheezing. (Patient not taking: Reported on 05/29/2017) 1 Inhaler 0   NIFEdipine (ADALAT CC) 60 MG 24 hr tablet TAKE 1 TABLET (60 MG TOTAL) BY MOUTH DAILY. CAN INCREASE TO TWICE A DAY AS NEEDED FOR SYMPTOMATIC CONTRACTIONS 180 tablet 1   NIFEdipine (PROCARDIA-XL/NIFEDICAL-XL) 30 MG 24 hr tablet TAKE 1 TABLET BY MOUTH EVERY DAY (Patient not taking: Reported on 11/19/2022) 90 tablet 1   triamcinolone cream (KENALOG) 0.1 % Apply 1 Application topically 2 (two) times daily. 30 g 0   No facility-administered medications prior to visit.   No Known Allergies   ROS: A complete ROS was performed with pertinent positives/negatives noted in the HPI. The remainder of the ROS are negative.    Objective:   Today's Vitals   11/19/22 1359 11/19/22 1422  BP: (!) 159/99 Marland Kitchen)  150/90  Pulse: 88   SpO2: 100%   Weight: 130 lb 1.6 oz (59 kg)   Height: 5\' 3"  (1.6 m)     GENERAL: Well-appearing, in NAD. Well nourished.  SKIN: Pink, warm and dry. No rash, lesion, ulceration, or ecchymoses.  Head: Normocephalic. NECK: Trachea midline. Full ROM w/o pain or tenderness. No lymphadenopathy.  RESPIRATORY: Chest wall symmetrical. Respirations even and non-labored. Breath sounds clear to auscultation bilaterally.  CARDIAC: S1, S2 present, regular rate and rhythm without murmur or gallops. Peripheral pulses 2+  bilaterally.  EXTREMITIES: Without clubbing, cyanosis, or edema.  NEUROLOGIC: No motor or sensory deficits. Steady, even gait. C2-C12 intact.  PSYCH/MENTAL STATUS: Alert, oriented x 3. Cooperative, appropriate mood and affect.   Health Maintenance Due  Topic Date Due   COVID-19 Vaccine (3 - 2023-24 season) 01/25/2022    No results found for any visits on 11/19/22.  The ASCVD Risk score (Arnett DK, et al., 2019) failed to calculate for the following reasons:   The 2019 ASCVD risk score is only valid for ages 105 to 60       Assessment & Plan:  Chronic hypertension Assessment & Plan: Check BMP today. Stop Amlodipine and start hydrochlorothiazide 12.5mg  daily. Stay hydrated. Instructed to check BP at least 3x a week and given BP log to return with in 4 weeks for check.   Orders: -     Basic metabolic panel  Other orders -     hydroCHLOROthiazide; Take 1 tablet (12.5 mg total) by mouth daily.  Dispense: 90 tablet; Refill: 3    Return in about 4 weeks (around 12/17/2022) for Follow up HTN .   Yolanda Manges, FNP

## 2022-11-19 NOTE — Patient Instructions (Signed)
Stop your Amlodipine. Stay hydrated.    Check your Blood pressure 3x a week and return the BP Log.

## 2022-11-19 NOTE — Assessment & Plan Note (Signed)
Check BMP today. Stop Amlodipine and start hydrochlorothiazide 12.5mg  daily. Stay hydrated. Instructed to check BP at least 3x a week and given BP log to return with in 4 weeks for check.

## 2022-11-20 LAB — BASIC METABOLIC PANEL
BUN/Creatinine Ratio: 14 (ref 9–23)
BUN: 12 mg/dL (ref 6–20)
CO2: 21 mmol/L (ref 20–29)
Calcium: 9.2 mg/dL (ref 8.7–10.2)
Chloride: 104 mmol/L (ref 96–106)
Creatinine, Ser: 0.83 mg/dL (ref 0.57–1.00)
Glucose: 104 mg/dL — ABNORMAL HIGH (ref 70–99)
Potassium: 4.1 mmol/L (ref 3.5–5.2)
Sodium: 141 mmol/L (ref 134–144)
eGFR: 95 mL/min/{1.73_m2} (ref >59)

## 2022-11-20 NOTE — Progress Notes (Signed)
BMP within normal limits. Pt to start hydrochlorothiazide for BP control.

## 2022-12-12 ENCOUNTER — Other Ambulatory Visit (HOSPITAL_BASED_OUTPATIENT_CLINIC_OR_DEPARTMENT_OTHER): Payer: Self-pay | Admitting: Family Medicine

## 2022-12-12 DIAGNOSIS — O165 Unspecified maternal hypertension, complicating the puerperium: Secondary | ICD-10-CM

## 2022-12-13 ENCOUNTER — Encounter: Payer: Self-pay | Admitting: Cardiology

## 2022-12-13 ENCOUNTER — Ambulatory Visit (INDEPENDENT_AMBULATORY_CARE_PROVIDER_SITE_OTHER): Payer: Medicaid Other | Admitting: Cardiology

## 2022-12-13 VITALS — BP 132/92 | HR 101 | Ht 63.0 in | Wt 129.3 lb

## 2022-12-13 DIAGNOSIS — I10 Essential (primary) hypertension: Secondary | ICD-10-CM | POA: Diagnosis not present

## 2022-12-13 NOTE — Patient Instructions (Signed)
Medication Instructions:   No changes   *If you need a refill on your cardiac medications before your next appointment, please call your pharmacy*   Lab Work:  Not needed   Testing/Procedures: Not needed   Follow-Up: At [facility], you and your health needs are our priority.  As part of our continuing mission to provide you with exceptional heart care, we have created designated Provider Care Teams.  These Care Teams include your primary Cardiologist (physician) and Advanced Practice Providers (APPs -  Physician Assistants and Nurse Practitioners) who all work together to provide you with the care you need, when you need it.     Your next appointment:   4 month(s)  The format for your next appointment:   In Person  Provider:   Thomasene Ripple, DO

## 2022-12-13 NOTE — Progress Notes (Unsigned)
Cardio-Obstetrics Clinic  Follow Up Note   Date:  12/16/2022   ID:  Traci King, DOB 1990/07/10, MRN [id number]  PCP:  Alyson Reedy, FNP   [facility] Providers Cardiologist:  Thomasene Ripple, DO  Electrophysiologist:  None        Referring MD: No ref. provider found   Chief Complaint: " I am doing much better"  History of Present Illness:    Traci King is a 32 y.o. female [G3P1112] who returns for follow up of hypertensin in pregnancy.   Medical history includes hypertension in pregnancy, Mild pre-eclampsia , hx of marijuana abuse, and hx of herpes genitalis.  Here for her postpartum follow up.   Prior CV Studies Reviewed: The following studies were reviewed today: Tte 07/15/2022 IMPRESSIONS     1. Left ventricular ejection fraction, by estimation, is 60 to 65%. The  left ventricle has normal function. The left ventricle has no regional  wall motion abnormalities. Left ventricular diastolic parameters were  normal. The average left ventricular  global longitudinal strain is -22.8 %. The global longitudinal strain is  normal.   2. Right ventricular systolic function is normal. The right ventricular  size is normal. There is normal pulmonary artery systolic pressure. The  estimated right ventricular systolic pressure is 28.6 mmHg.   3. Right atrial size was mild to moderately dilated.   4. The mitral valve is normal in structure. Trivial mitral valve  regurgitation. No evidence of mitral stenosis.   5. The aortic valve is normal in structure. Aortic valve regurgitation is  not visualized. No aortic stenosis is present.   6. The inferior vena cava is normal in size with greater than 50%  respiratory variability, suggesting right atrial pressure of 3 mmHg.   FINDINGS   Left Ventricle: Left ventricular ejection fraction, by estimation, is 60  to 65%. The left ventricle has normal function. The left ventricle has no  regional wall motion  abnormalities. The average left ventricular global  longitudinal strain is -22.8 %.  The global longitudinal strain is normal. The left ventricular internal  cavity size was normal in size. There is no left ventricular hypertrophy.  Left ventricular diastolic parameters were normal. Normal left ventricular  filling pressure.   Right Ventricle: The right ventricular size is normal. No increase in  right ventricular wall thickness. Right ventricular systolic function is  normal. There is normal pulmonary artery systolic pressure. The tricuspid  regurgitant velocity is 2.53 m/s, and   with an assumed right atrial pressure of 3 mmHg, the estimated right  ventricular systolic pressure is 28.6 mmHg.   Left Atrium: Left atrial size was normal in size.   Right Atrium: Right atrial size was mild to moderately dilated.   Pericardium: There is no evidence of pericardial effusion.   Mitral Valve: The mitral valve is normal in structure. Trivial mitral  valve regurgitation. No evidence of mitral valve stenosis.   Tricuspid Valve: The tricuspid valve is normal in structure. Tricuspid  valve regurgitation is mild . No evidence of tricuspid stenosis.   Aortic Valve: The aortic valve is normal in structure. Aortic valve  regurgitation is not visualized. No aortic stenosis is present.   Pulmonic Valve: The pulmonic valve was normal in structure. Pulmonic valve  regurgitation is trivial. No evidence of pulmonic stenosis.   Aorta: The aortic root is normal in size and structure.   Venous: The inferior vena cava is normal in size with greater than 50%  respiratory variability,  suggesting right atrial pressure of 3 mmHg.   IAS/Shunts: No atrial level shunt detected by color flow Doppler.       Past Medical History:  Diagnosis Date   Abnormal Pap smear    Herpes simplex type 1 infection    History of stomach ulcers    Preeclampsia    Uterine synechiae     Past Surgical History:  Procedure  Laterality Date   TOOTH EXTRACTION     TUBAL LIGATION Bilateral 07/06/2022   Procedure: POST PARTUM TUBAL LIGATION;  Surgeon: Adam Phenix, MD;  Location: [facility] LD [facility]S;  Service: Gynecology;  Laterality: Bilateral;      OB History     Gravida  3   Para  2   Term  1   Preterm  1   AB  1   Living  2      SAB  0   IAB  1   Ectopic  0   Multiple  0   Live Births  2               Current Medications: Current Meds  Medication Sig   hydrochlorothiazide (HYDRODIURIL) 12.5 MG tablet Take 1 tablet (12.5 mg total) by mouth daily.     Allergies:   Patient has no known allergies.   Social History   Socioeconomic History   Marital status: Single    Spouse name: Not on file   Number of children: Not on file   Years of education: Not on file   Highest education level: Some college, no degree  Occupational History   Not on file  Tobacco Use   Smoking status: Former    Current packs/day: 0.00    Average packs/day: 0.3 packs/day for 2.0 years (0.5 ttl pk-yrs)    Types: Cigarettes    Start date: 04/08/2009    Quit date: 04/09/2011    Years since quitting: 11.6    Passive exposure: Current   Smokeless tobacco: Never  Vaping Use   Vaping status: Never Used  Substance and Sexual Activity   Alcohol use: No   Drug use: Not Currently    Types: Marijuana   Sexual activity: Not on file  Other Topics Concern   Not on file  Social History Narrative   Not on file   Social Determinants of Health   Financial Resource Strain: Medium Risk (11/19/2022)   Overall Financial Resource Strain (CARDIA)    Difficulty of Paying Living Expenses: Somewhat hard  Food Insecurity: No Food Insecurity (11/19/2022)   Hunger Vital Sign    Worried About Running Out of Food in the Last Year: Never true    Ran Out of Food in the Last Year: Never true  Transportation Needs: No Transportation Needs (11/19/2022)   PRAPARE - Administrator, Civil Service (Medical): No    Lack of  Transportation (Non-Medical): No  Physical Activity: Insufficiently Active (11/19/2022)   Exercise Vital Sign    Days of Exercise per Week: 2 days    Minutes of Exercise per Session: 30 min  Stress: No Stress Concern Present (11/19/2022)   [employer] of Occupational Health - Occupational Stress Questionnaire    Feeling of Stress : Only a little  Social Connections: Moderately Isolated (11/19/2022)   Social Connection and Isolation Panel [NHANES]    Frequency of Communication with Friends and Family: More than three times a week    Frequency of Social Gatherings with Friends and Family: Twice a week  Attends Religious Services: 1 to 4 times per year    Active Member of Clubs or Organizations: No    Attends Engineer, structural: Not on file    Marital Status: Never married      Family History  Problem Relation Age of Onset   Asthma Mother    Hypertension Mother    Diabetes Mother    Hypertension Maternal Grandmother    Diabetes Maternal Grandfather    Cancer Neg Hx    Heart disease Neg Hx       ROS:   Please see the history of present illness.     All other systems reviewed and are negative.   Labs/EKG Reviewed:    EKG:   EKG is was not  ordered today.    Recent Labs: 10/09/2022: ALT 15; Hemoglobin 14.6; Magnesium 2.2; Platelets 283; TSH 0.890; TSH CANCELED 11/19/2022: BUN 12; Creatinine, Ser 0.83; Potassium 4.1; Sodium 141   Recent Lipid Panel Lab Results  Component Value Date/Time   CHOL 167 10/09/2022 11:03 AM   TRIG 95 10/09/2022 11:03 AM   HDL 54 10/09/2022 11:03 AM   CHOLHDL 3.1 10/09/2022 11:03 AM   LDLCALC 96 10/09/2022 11:03 AM    Physical Exam:    VS:  BP (!) 132/92 (BP Location: Left Arm, Patient Position: Sitting, Cuff Size: Normal)   Pulse (!) 101   Ht 5\' 3"  (1.6 m)   Wt 129 lb 4.8 oz (58.7 kg)   LMP 11/08/2022   SpO2 98%   BMI 22.90 kg/m     Wt Readings from Last 3 Encounters:  12/13/22 129 lb 4.8 oz (58.7 kg)  11/19/22 130  lb 1.6 oz (59 kg)  10/10/22 133 lb 11.2 oz (60.6 kg)     GEN:  Well nourished, well developed in no acute distress HEENT: Normal NECK: No JVD; No carotid bruits LYMPHATICS: No lymphadenopathy CARDIAC: RRR, no murmurs, rubs, gallops RESPIRATORY:  Clear to auscultation without rales, wheezing or rhonchi  ABDOMEN: Soft, non-tender, non-distended MUSCULOSKELETAL:  No edema; No deformity  SKIN: Warm and dry NEUROLOGIC:  Alert and oriented x 3 PSYCHIATRIC:  Normal affect    Risk Assessment/Risk Calculators:                  ASSESSMENT & PLAN:    Hypertension   She is post partum and doing well. Currently  on hydrochlorothiazide 12.5 mg daily. Will continue for now.  Plans to watch the salt.   Will see her in 4 months   Patient Instructions  Medication Instructions:   No changes   *If you need a refill on your cardiac medications before your next appointment, please call your pharmacy*   Lab Work:  Not needed   Testing/Procedures: Not needed   Follow-Up: At [facility], you and your health needs are our priority.  As part of our continuing mission to provide you with exceptional heart care, we have created designated Provider Care Teams.  These Care Teams include your primary Cardiologist (physician) and Advanced Practice Providers (APPs -  Physician Assistants and Nurse Practitioners) who all work together to provide you with the care you need, when you need it.     Your next appointment:   4 month(s)  The format for your next appointment:   In Person  Provider:   Thomasene Ripple, DO       Dispo:  Return in about 4 months (around 04/15/2023).   Medication Adjustments/Labs and Tests Ordered: Current medicines are reviewed  at length with the patient today.  Concerns regarding medicines are outlined above.  Tests Ordered: No orders of the defined types were placed in this encounter.  Medication Changes: No orders of the defined types were placed in  this encounter.

## 2022-12-17 ENCOUNTER — Ambulatory Visit (HOSPITAL_BASED_OUTPATIENT_CLINIC_OR_DEPARTMENT_OTHER): Payer: Medicaid Other | Admitting: Family Medicine

## 2022-12-18 ENCOUNTER — Ambulatory Visit (HOSPITAL_BASED_OUTPATIENT_CLINIC_OR_DEPARTMENT_OTHER): Payer: Medicaid Other | Admitting: Family Medicine

## 2022-12-23 ENCOUNTER — Encounter (HOSPITAL_BASED_OUTPATIENT_CLINIC_OR_DEPARTMENT_OTHER): Payer: Self-pay

## 2022-12-23 ENCOUNTER — Ambulatory Visit (HOSPITAL_BASED_OUTPATIENT_CLINIC_OR_DEPARTMENT_OTHER): Payer: Medicaid Other | Admitting: Family Medicine

## 2022-12-25 ENCOUNTER — Encounter (HOSPITAL_BASED_OUTPATIENT_CLINIC_OR_DEPARTMENT_OTHER): Payer: Self-pay | Admitting: Family Medicine

## 2022-12-25 ENCOUNTER — Ambulatory Visit (INDEPENDENT_AMBULATORY_CARE_PROVIDER_SITE_OTHER): Payer: Medicaid Other | Admitting: Family Medicine

## 2022-12-25 VITALS — BP 128/97 | HR 97 | Ht 63.0 in | Wt 128.5 lb

## 2022-12-25 DIAGNOSIS — I10 Essential (primary) hypertension: Secondary | ICD-10-CM

## 2022-12-25 MED ORDER — HYDROCHLOROTHIAZIDE 25 MG PO TABS: 25.0000 mg | ORAL_TABLET | Freq: Every day | ORAL | 2 refills | Status: DC

## 2022-12-25 NOTE — Progress Notes (Signed)
   Established Patient Office Visit  Subjective   Patient ID: Traci King, female    DOB: 01/09/1991  Age: 32 y.o. MRN: [id number]  No chief complaint on file.  HYPERTENSION: Traci King is a 32 year-old female patient who presents for the medical management of hypertension.   Patient's current hypertension medication regimen is: hydrochlorothiazide 12.5mg   Patient is currently taking prescribed medications for HTN.  Patient is not regularly keeping a check on BP at home.  Adhering to low sodium diet: no Exercising Regularly: yes Denies dizziness, CP, SHOB, vision changes.   Had a slight headache with amlodipine and without the amlodipine. Notices a relief with ibuprofen.    BP Readings from Last 3 Encounters:  12/25/22 (!) 128/97  12/13/22 (!) 132/92  11/19/22 (!) 150/90     Review of Systems  Constitutional:  Negative for malaise/fatigue.  Eyes:  Negative for blurred vision and double vision.  Respiratory:  Negative for cough and shortness of breath.   Cardiovascular:  Negative for chest pain, palpitations and leg swelling.  Gastrointestinal:  Negative for abdominal pain, nausea and vomiting.  Neurological:  Positive for headaches. Negative for dizziness and weakness.  Psychiatric/Behavioral:  Negative for depression and suicidal ideas. The patient is not nervous/anxious and does not have insomnia.      Objective:    BP (!) 128/97   Pulse 97   Ht 5\' 3"  (1.6 m)   Wt 128 lb 8 oz (58.3 kg)   LMP 12/16/2022 (Approximate)   SpO2 100%   Breastfeeding No   BMI 22.76 kg/m  BP Readings from Last 3 Encounters:  12/25/22 (!) 128/97  12/13/22 (!) 132/92  11/19/22 (!) 150/90     Physical Exam Constitutional:      Appearance: Normal appearance.  Cardiovascular:     Rate and Rhythm: Normal rate and regular rhythm.     Pulses: Normal pulses.     Heart sounds: Normal heart sounds.  Pulmonary:     Effort: Pulmonary effort is normal.     Breath sounds: Normal  breath sounds.  Neurological:     Mental Status: She is alert.  Psychiatric:        Mood and Affect: Mood normal.        Behavior: Behavior normal.        Thought Content: Thought content normal.        Judgment: Judgment normal.       Assessment & Plan:   1. Chronic hypertension Patient presents today with elevated blood pressure, recheck improved but still elevated. Patient in no acute distress and is well-appearing. Denies chest pain, shortness of breath, lower extremity edema, vision changes, headaches. Cardiovascular exam with heart regular rate and rhythm. Normal heart sounds, no murmurs present. No lower extremity edema present. Lungs clear to auscultation bilaterally. Patient is currently taking hydrochlorothiazide 12.5mg  daily. Will increase to 25mg  daily for better control of blood pressure. Refills provided today. Advised patient to closely monitor blood pressure and bring BP log to next visit. Return to office sooner if blood pressure begins to increase greater than 130/80. Follow-up in 6 weeks.       Return in about 6 weeks (around 02/05/2023) for HTN follow-up.    Traci Reedy, FNP

## 2023-01-11 ENCOUNTER — Emergency Department (HOSPITAL_COMMUNITY)
Admission: EM | Admit: 2023-01-11 | Discharge: 2023-01-11 | Disposition: A | Discharge status: Home / Self Care | Payer: Medicaid Other | Source: Home / Self Care | Attending: Emergency Medicine | Admitting: Emergency Medicine

## 2023-01-11 ENCOUNTER — Encounter (HOSPITAL_COMMUNITY): Payer: Self-pay

## 2023-01-11 ENCOUNTER — Other Ambulatory Visit: Payer: Self-pay

## 2023-01-11 DIAGNOSIS — R55 Syncope and collapse: Secondary | ICD-10-CM | POA: Insufficient documentation

## 2023-01-11 LAB — CBC
HCT: 42.2 % (ref 36.0–46.0)
Hemoglobin: 14 g/dL (ref 12.0–15.0)
MCH: 29.9 pg (ref 26.0–34.0)
MCHC: 33.2 g/dL (ref 30.0–36.0)
MCV: 90.2 fL (ref 80.0–100.0)
Platelets: 227 10*3/uL (ref 150–400)
RBC: 4.68 MIL/uL (ref 3.87–5.11)
RDW: 12.8 % (ref 11.5–15.5)
WBC: 6.1 10*3/uL (ref 4.0–10.5)
nRBC: 0 % (ref 0.0–0.2)

## 2023-01-11 LAB — URINALYSIS, ROUTINE W REFLEX MICROSCOPIC
Bilirubin Urine: NEGATIVE
Glucose, UA: NEGATIVE mg/dL
Hgb urine dipstick: NEGATIVE
Ketones, ur: NEGATIVE mg/dL
Leukocytes,Ua: NEGATIVE
Nitrite: NEGATIVE
Protein, ur: NEGATIVE mg/dL
Specific Gravity, Urine: 1.014 (ref 1.005–1.030)
pH: 6 (ref 5.0–8.0)

## 2023-01-11 LAB — BASIC METABOLIC PANEL
Anion gap: 9 (ref 5–15)
BUN: 15 mg/dL (ref 6–20)
CO2: 25 mmol/L (ref 22–32)
Calcium: 9.4 mg/dL (ref 8.9–10.3)
Chloride: 102 mmol/L (ref 98–111)
Creatinine, Ser: 0.71 mg/dL (ref 0.44–1.00)
GFR, Estimated: >60 mL/min (ref >60)
Glucose, Bld: 85 mg/dL (ref 70–99)
Potassium: 3.7 mmol/L (ref 3.5–5.1)
Sodium: 136 mmol/L (ref 135–145)

## 2023-01-11 LAB — CBG MONITORING, ED: Glucose-Capillary: 80 mg/dL (ref 70–99)

## 2023-01-11 LAB — HCG, SERUM, QUALITATIVE: Preg, Serum: NEGATIVE

## 2023-01-11 NOTE — ED Triage Notes (Signed)
Pt arrived via EMS, from work. Had near syncopal episode, was very dizzy, vomiting several times. Has not had adequate PO intake over the last few days.

## 2023-01-11 NOTE — Discharge Instructions (Addendum)
It was a pleasure caring for you today.  Lab workup was reassuring.  I recommend following up with your primary care provider.  Seek emergency care if experiencing any new or worsening symptoms.

## 2023-01-11 NOTE — ED Provider Notes (Signed)
[place] [facility]MENT AT [facility] Provider Note   CSN: [id number] Arrival date & time: 01/11/23  1014     History  Chief Complaint  Patient presents with   Dizziness    Traci King is a 32 y.o. female with PMHx HSV1, stomach ulcers who presents to ED concerned for near syncopal episode while at work. Patient was standing when she became dizzy and had 2 episodes of non-bloody emesis. Patient sat down and started breathing deeply which resolved the symptoms. Patient stating that she didn't take her BP medication at the right time and wonders if symptoms could be d/t that. Patient also stating that she hasn't been eating as much as she needs to over the past couple of days d/t work schedule. Patient stating that she doesn't feel like she is dehydrated and refusing IV fluids at this time.  Denies fever, chest pain, dyspnea, cough, abdominal pain, nausea, diarrhea, dysuria, hematuria, hematochezia.   Dizziness      Home Medications Prior to Admission medications   Medication Sig Start Date End Date Taking? Authorizing Provider  hydrochlorothiazide (HYDRODIURIL) 25 MG tablet Take 1 tablet (25 mg total) by mouth daily. 12/25/22   Alyson Reedy, FNP  ibuprofen (ADVIL) 600 MG tablet Take 600 mg by mouth every 6 (six) hours as needed.    [provider]      Allergies    Patient has no known allergies.    Review of Systems   Review of Systems  Neurological:  Positive for dizziness.    Physical Exam Updated Vital Signs BP 112/72   Pulse 76   Temp 97.7 F (36.5 C) (Oral)   Resp 16   LMP 12/16/2022 (Approximate)   SpO2 100%  Physical Exam Vitals and nursing note reviewed.  Constitutional:      General: She is not in acute distress.    Appearance: She is not ill-appearing or toxic-appearing.  HENT:     Head: Normocephalic and atraumatic.     Mouth/Throat:     Mouth: Mucous membranes are moist.  Eyes:     General: No scleral  icterus.       Right eye: No discharge.        Left eye: No discharge.     Conjunctiva/sclera: Conjunctivae normal.  Cardiovascular:     Rate and Rhythm: Normal rate and regular rhythm.     Pulses: Normal pulses.     Heart sounds: Normal heart sounds. No murmur heard.    Comments: +2 radial and pedal pulses Pulmonary:     Effort: Pulmonary effort is normal. No respiratory distress.     Breath sounds: Normal breath sounds. No wheezing, rhonchi or rales.  Abdominal:     General: Abdomen is flat. Bowel sounds are normal. There is no distension.     Palpations: Abdomen is soft. There is no mass.     Tenderness: There is no abdominal tenderness.  Musculoskeletal:     Right lower leg: No edema.     Left lower leg: No edema.     Comments: Patient ambulatory without complaint  Skin:    General: Skin is warm and dry.     Capillary Refill: Capillary refill takes less than 2 seconds.     Findings: No rash.  Neurological:     General: No focal deficit present.     Mental Status: She is alert and oriented to person, place, and time. Mental status is at baseline.  Psychiatric:  Mood and Affect: Mood normal.        Behavior: Behavior normal.     ED Results / Procedures / Treatments   Labs (all labs ordered are listed, but only abnormal results are displayed) Labs Reviewed  URINALYSIS, ROUTINE W REFLEX MICROSCOPIC - Abnormal; Notable for the following components:      Result Value   APPearance HAZY (*)    All other components within normal limits  BASIC METABOLIC PANEL  CBC  HCG, SERUM, QUALITATIVE  CBG MONITORING, ED    EKG None  Radiology No results found.  Procedures Procedures    Medications Ordered in ED Medications - No data to display  ED Course/ Medical Decision Making/ A&P                                 Medical Decision Making Amount and/or Complexity of Data Reviewed Labs: ordered.   This patient presents to the ED for concern of syncope, this  involves an extensive number of treatment options, and is a complaint that carries with it a high risk of complications and morbidity.  The differential diagnosis includes CVA, ICH, intracranial mass, critical dehydration, endocrine abnormality, sepsis/infection, electrolyte abnormality, cardiac arrhythmia.   Co morbidities that complicate the patient evaluation  none   Lab Tests:  I Ordered, and personally interpreted labs.  The pertinent results include:   -UA: not concerning for infection - BMP: no concern for electrolyte abnormality; no concern for kidney damage - CBC: No concern for anemia or leukocytosis - CBG: 80 -hCG: negative   Cardiac Monitoring: / EKG:  The patient was maintained on a cardiac monitor.  I personally viewed and interpreted the cardiac monitored which showed an underlying rhythm of: Sinus rhythm without acute ST changes or arrhythmias   Problem List / ED Course / Critical interventions / Medication management  Patient presents to the ED concerned for presyncopal episode while at work earlier today. Denies LOC.  Patient was standing when she started getting hot flashes, dizzy, and vomited twice.  Patient stating that she has not been eating as much as she should be given work schedule.  Patient also stating that she does not think she is taking her hypertensive medicines properly and wonders if this could have contributed to her symptoms. Physical exam unremarkable.  Patient afebrile with stable vitals.  Patient denying any other infectious symptoms at this time such as fever, cough, nausea, diarrhea, abdominal pain.  No vomiting in the ED. UA not concerning for infection.  hCG negative.  CBC without leukocytosis or anemia.  BMP without electrolyte abnormalities.  Kidney function test within normal limits.  CBG 80. It appears that patient had a near vaso-vagal syncopal episode today which seems to be d/t BP medication and low PO intake. Educated patient on symptoms  of hypotension and proper nutrition. I have reviewed the patients home medicines and have made adjustments as needed Patient was given return precautions. Patient stable for discharge at this time.  Patient verbalized understanding of plan.  Ddx: these are considered less likely due to history of present illness and physical exam -CVA/ICH/intracranial mass: patient without neurodeficits; no history of seizure -Critical dehydration: BMP/CMP without concern -Sepsis/infection: SIRs criteria not met; patient afebrile without infectious symptoms -Electrolyte abnormality: BMP/CMP without concern  -Cardiac arrhythmia: EKG shows NSR without acute ST changes   Social Determinants of Health:  none  Final Clinical Impression(s) / ED Diagnoses Final diagnoses:  Near syncope    Rx / DC Orders ED Discharge Orders     None         Dorthy Cooler, [place] 01/11/23 1259    Maia Plan, MD 01/13/23 [phone]

## 2023-01-31 ENCOUNTER — Encounter: Payer: Self-pay | Admitting: Pharmacist

## 2023-02-04 ENCOUNTER — Encounter: Payer: Self-pay | Admitting: Dermatology

## 2023-02-04 ENCOUNTER — Other Ambulatory Visit (HOSPITAL_BASED_OUTPATIENT_CLINIC_OR_DEPARTMENT_OTHER): Payer: Self-pay | Admitting: Family Medicine

## 2023-02-04 ENCOUNTER — Ambulatory Visit (INDEPENDENT_AMBULATORY_CARE_PROVIDER_SITE_OTHER): Payer: Medicaid Other | Admitting: Dermatology

## 2023-02-04 ENCOUNTER — Other Ambulatory Visit: Payer: Self-pay | Admitting: Obstetrics and Gynecology

## 2023-02-04 VITALS — BP 117/78 | HR 93

## 2023-02-04 DIAGNOSIS — L731 Pseudofolliculitis barbae: Secondary | ICD-10-CM

## 2023-02-04 DIAGNOSIS — R21 Rash and other nonspecific skin eruption: Secondary | ICD-10-CM | POA: Diagnosis not present

## 2023-02-04 DIAGNOSIS — L680 Hirsutism: Secondary | ICD-10-CM | POA: Diagnosis not present

## 2023-02-04 DIAGNOSIS — I10 Essential (primary) hypertension: Secondary | ICD-10-CM

## 2023-02-04 MED ORDER — SPIRONOLACTONE 50 MG PO TABS: ORAL_TABLET | ORAL | 4 refills | Status: AC

## 2023-02-04 MED ORDER — ADAPALENE 0.1 % EX CREA: TOPICAL_CREAM | CUTANEOUS | 4 refills | Status: AC

## 2023-02-04 NOTE — Patient Instructions (Addendum)
Hello Traci King,  Thank you for visiting [place] today. We appreciate your commitment to addressing your health concerns and are here to support you in improving your condition. Here is a summary of the key instructions from today's consultation:  - Medication: Start taking Spironolactone 150 mg daily before bedtime. This involves taking three 50 mg tablets. This medication is aimed at reducing hair growth by blocking hormone receptors.   - Monitoring: Keep an eye out for any signs of lightheadedness or dizziness.  - Discontinue Hydrochlorothiazide: As you are starting on Spironolactone, please stop taking Hydrochlorothiazide to avoid excessive diuresis.  - Adapalene Cream: Apply this cream every other night mixing with lotion to help with ingrown hairs and irritation.   - Precaution: Remember to discontinue use one week before any waxing to prevent skin damage.  - Lifestyle Advice: It is advised to avoid smoking marijuana, especially before appointments, as it can affect your treatment and overall health.  - Follow-Up: We will see you again in three months to monitor your progress and adjust the treatment as necessary.  - Home Care Tool: Consider using a battery-operated facial electric shaver for managing facial hair between waxing sessions. This tool is gentle on the skin and can be found online or at local stores.  Please follow these instructions carefully and do not hesitate to contact our office if you have any questions or concerns. We look forward to seeing you again and tracking your progress.  Warm regards,  Dr. Langston Reusing,  Dermatology          Important Information  Due to recent changes in healthcare laws, you may see results of your pathology and/or laboratory studies on MyChart before the doctors have had a chance to review them. We understand that in some cases there may be results that are confusing or concerning to you. Please understand that not all results are  received at the same time and often the doctors may need to interpret multiple results in order to provide you with the best plan of care or course of treatment. Therefore, we ask that you please give [place] 2 business days to thoroughly review all your results before contacting the office for clarification. Should we see a critical lab result, you will be contacted sooner.   If You Need Anything After Your Visit  If you have any questions or concerns for your doctor, please call our main line at [phone] If no one answers, please leave a voicemail as directed and we will return your call as soon as possible. Messages left after 4 pm will be answered the following business day.   You may also send [place] a message via MyChart. We typically respond to MyChart messages within 1-2 business days.  For prescription refills, please ask your pharmacy to contact our office. Our fax number is [phone].  If you have an urgent issue when the clinic is closed that cannot wait until the next business day, you can page your doctor at the number below.    Please note that while we do our best to be available for urgent issues outside of office hours, we are not available 24/7.   If you have an urgent issue and are unable to reach [place], you may choose to seek medical care at your doctor's office, retail clinic, urgent care center, or emergency room.  If you have a medical emergency, please immediately call 911 or go to the emergency department. In the event of inclement weather, please call  our main line at [phone] for an update on the status of any delays or closures.  Dermatology Medication Tips: Please keep the boxes that topical medications come in in order to help keep track of the instructions about where and how to use these. Pharmacies typically print the medication instructions only on the boxes and not directly on the medication tubes.   If your medication is too expensive, please contact our  office at [phone] or send [place] a message through MyChart.   We are unable to tell what your co-pay for medications will be in advance as this is different depending on your insurance coverage. However, we may be able to find a substitute medication at lower cost or fill out paperwork to get insurance to cover a needed medication.   If a prior authorization is required to get your medication covered by your insurance company, please allow [place] 1-2 business days to complete this process.  Drug prices often vary depending on where the prescription is filled and some pharmacies may offer cheaper prices.  The website www.goodrx.com contains coupons for medications through different pharmacies. The prices here do not account for what the cost may be with help from insurance (it may be cheaper with your insurance), but the website can give you the price if you did not use any insurance.  - You can print the associated coupon and take it with your prescription to the pharmacy.  - You may also stop by our office during regular business hours and pick up a GoodRx coupon card.  - If you need your prescription sent electronically to a different pharmacy, notify our office through [facility] or by phone at [phone]

## 2023-02-04 NOTE — Progress Notes (Signed)
   New Patient Visit   Subjective  Traci King is a 32 y.o. female who presents for the following: excess hair all over  Pt is here today to be evaluated for excess hair all over body. She states she has been having this issue for many years but has worsened since she had her 2nd child (baby is old). She has never  been treated for this and states it is bothersome. She waxes once a mth.  The following portions of the chart were reviewed this encounter and updated as appropriate: medications, allergies, medical history  Review of Systems:  No other skin or systemic complaints except as noted in HPI or Assessment and Plan.  Objective  Well appearing patient in no apparent distress; mood and affect are within normal limits.   A focused examination was performed of the following areas: All over  Relevant exam findings are noted in the Assessment and Plan.  Exam: Excessive hair growth on the body and on the face           Assessment & Plan   1. Hirsutism - Assessment: Patient presents with excessive hair growth since the age of 46 or 27, noting a family history of excessive hair growth on the father's side. Hormone levels, including testosterone and prolactin, are within normal limits. - Plan: Initiate spironolactone therapy at 150 mg once daily, to be taken at bedtime. Discontinue hydrochlorothiazide. Schedule a reevaluation in three months to assess treatment effectiveness and adjust dosage if necessary. Educate the patient on potential side effects, such as lightheadedness or dizziness.   2. Ingrown Hairs and Skin Irritation - Assessment: Patient engages in waxing and occasional shaving, leading to ingrown hairs and skin irritation. - Plan: Prescribe Adapalene cream for nightly application to smooth the skin and lighten dark spots. Advise the patient to discontinue use one week before waxing to prevent skin damage. Recommend using an electric facial shaver for  gentle hair removal between waxing sessions.  3. Hypertension - Assessment: Patient is currently managing blood pressure with hydrochlorothiazide. - Plan: Discontinue hydrochlorothiazide and monitor blood pressure during spironolactone treatment. Adjust the treatment plan as necessary based on blood pressure readings and the patient's response to spironolactone.  Follow-up: Arrange a follow-up appointment in three months to evaluate the patient's response to spironolactone treatment and discuss any concerns or side effects.  No follow-ups on file.  Owens Shark, CMA, am acting as scribe for [employer], DO.   Documentation: I have reviewed the above documentation for accuracy and completeness, and I agree with the above.  Langston Reusing, DO

## 2023-02-05 ENCOUNTER — Ambulatory Visit (INDEPENDENT_AMBULATORY_CARE_PROVIDER_SITE_OTHER): Payer: Medicaid Other | Admitting: Family Medicine

## 2023-02-05 ENCOUNTER — Encounter (HOSPITAL_BASED_OUTPATIENT_CLINIC_OR_DEPARTMENT_OTHER): Payer: Self-pay | Admitting: Family Medicine

## 2023-02-05 VITALS — BP 123/90 | HR 87 | Ht 63.0 in | Wt 124.0 lb

## 2023-02-05 DIAGNOSIS — I10 Essential (primary) hypertension: Secondary | ICD-10-CM | POA: Diagnosis not present

## 2023-02-05 NOTE — Progress Notes (Signed)
   Established Patient Office Visit  Subjective   Patient ID: Traci King, female    DOB: 06/29/1990  Age: 32 y.o. MRN: [id number]  HYPERTENSION: Traci King is a 32 year-old female patient presents for the medical management of hypertension.  Patient's current hypertension medication regimen is: hydrochlorothiazide 25mg  daily Patient is currently taking prescribed medications for HTN.  Patient is not regularly keeping a check on BP at home.  Denies headache, dizziness, CP, SHOB, vision changes.    BP Readings from Last 3 Encounters:  02/05/23 (!) 123/90  02/04/23 117/78  01/11/23 110/70   Review of Systems  Constitutional:  Negative for malaise/fatigue.  Respiratory:  Negative for cough and shortness of breath.   Cardiovascular:  Negative for chest pain, palpitations and leg swelling.  Gastrointestinal:  Negative for abdominal pain, nausea and vomiting.  Musculoskeletal:  Negative for myalgias.  Neurological:  Negative for dizziness, weakness and headaches.  Psychiatric/Behavioral:  Negative for depression and suicidal ideas. The patient is not nervous/anxious and does not have insomnia.       Objective:     BP (!) 123/90   Pulse 87   Ht 5\' 3"  (1.6 m)   Wt 124 lb (56.2 kg)   SpO2 100%   BMI 21.97 kg/m  BP Readings from Last 3 Encounters:  02/05/23 (!) 123/90  02/04/23 117/78  01/11/23 110/70     Physical Exam Constitutional:      Appearance: Normal appearance.  Cardiovascular:     Rate and Rhythm: Normal rate and regular rhythm.     Pulses: Normal pulses.     Heart sounds: Normal heart sounds.  Pulmonary:     Effort: Pulmonary effort is normal.     Breath sounds: Normal breath sounds.  Musculoskeletal:     Right lower leg: No edema.     Left lower leg: No edema.  Neurological:     Mental Status: She is alert.  Psychiatric:        Mood and Affect: Mood normal.        Behavior: Behavior normal.        Thought Content: Thought content normal.         Judgment: Judgment normal.       Assessment & Plan:  Chronic hypertension Assessment & Plan: Patient presents today with slightly elevated diastolic blood pressure. She reports that she did not take her hydrochlorothiazide this morning. Patient in no acute distress and is well-appearing. Denies chest pain, shortness of breath, lower extremity edema, vision changes, headaches. Cardiovascular exam with heart regular rate and rhythm. Normal heart sounds, no murmurs present. No lower extremity edema present. Lungs clear to auscultation bilaterally. Patient had recent appt with dermatology, who is starting her on 150mg  spirolactone at night and counseled her about stopping HCTZ. Discussed this with patient, feel that it is reasonable/safe to stay on hydrochlorothiazide but decrease dose to 12.5mg  daily. Advised patient to closely monitor blood pressure at home and return to office sooner if blood pressure begins to increase greater than 130/80. Follow-up in 4 weeks.         Return in about 4 weeks (around 03/05/2023) for HTN follow-up.    Alyson Reedy, FNP

## 2023-02-05 NOTE — Assessment & Plan Note (Signed)
Patient presents today with slightly elevated diastolic blood pressure. She reports that she did not take her hydrochlorothiazide this morning. Patient in no acute distress and is well-appearing. Denies chest pain, shortness of breath, lower extremity edema, vision changes, headaches. Cardiovascular exam with heart regular rate and rhythm. Normal heart sounds, no murmurs present. No lower extremity edema present. Lungs clear to auscultation bilaterally. Patient had recent appt with dermatology, who is starting her on 150mg  spirolactone at night and counseled her about stopping HCTZ. Discussed this with patient, feel that it is reasonable/safe to stay on hydrochlorothiazide but decrease dose to 12.5mg  daily. Advised patient to closely monitor blood pressure at home and return to office sooner if blood pressure begins to increase greater than 130/80. Follow-up in 4 weeks.

## 2023-02-24 ENCOUNTER — Other Ambulatory Visit: Payer: Self-pay | Admitting: Family Medicine

## 2023-03-05 ENCOUNTER — Ambulatory Visit (HOSPITAL_BASED_OUTPATIENT_CLINIC_OR_DEPARTMENT_OTHER): Payer: Medicaid Other | Admitting: Family Medicine

## 2023-03-10 ENCOUNTER — Ambulatory Visit (HOSPITAL_BASED_OUTPATIENT_CLINIC_OR_DEPARTMENT_OTHER): Payer: Medicaid Other | Admitting: Family Medicine

## 2023-03-17 ENCOUNTER — Ambulatory Visit (HOSPITAL_BASED_OUTPATIENT_CLINIC_OR_DEPARTMENT_OTHER): Payer: Medicaid Other | Admitting: Family Medicine

## 2023-03-23 ENCOUNTER — Encounter (HOSPITAL_COMMUNITY): Payer: Self-pay | Admitting: *Deleted

## 2023-03-23 ENCOUNTER — Ambulatory Visit (HOSPITAL_COMMUNITY)
Admission: EM | Admit: 2023-03-23 | Discharge: 2023-03-23 | Disposition: A | Discharge status: Home / Self Care | Payer: Medicaid Other | Attending: Family Medicine | Admitting: Family Medicine

## 2023-03-23 DIAGNOSIS — L723 Sebaceous cyst: Secondary | ICD-10-CM | POA: Diagnosis not present

## 2023-03-23 DIAGNOSIS — L089 Local infection of the skin and subcutaneous tissue, unspecified: Secondary | ICD-10-CM

## 2023-03-23 MED ORDER — LIDOCAINE-EPINEPHRINE 1 %-1:100000 IJ SOLN
INTRAMUSCULAR | Status: AC
  Filled 2023-03-23: qty 1

## 2023-03-23 MED ORDER — SULFAMETHOXAZOLE-TRIMETHOPRIM 800-160 MG PO TABS: 1.0000 | ORAL_TABLET | Freq: Two times a day (BID) | ORAL | 0 refills | Status: AC

## 2023-03-23 NOTE — Discharge Instructions (Signed)
You were seen today for an infected cyst.  This was opened and drained today.  I have sent an antibiotic to your pharmacy as well. Please keep it covered while it is open and draining.  Please change the dressing accordingly.  Please return if not improving as expected.

## 2023-03-23 NOTE — ED Triage Notes (Signed)
C/O left axillary abscess onset approx 1 wk ago with progressive worsening. States has applied fatback to it to help draw it out. Denies fever.

## 2023-03-23 NOTE — ED Provider Notes (Signed)
[facility]    CSN: [id number] Arrival date & time: 03/23/23  1420      History   Chief Complaint Chief Complaint  Patient presents with   Abscess    HPI Traci King is a 32 y.o. female.    Abscess  Patient is here for an abscess to the left axilla x  1 week.  Getting more and more painful.   Red, swollen.  Has not had an abscess in this area in the past.       Past Medical History:  Diagnosis Date   Abnormal Pap smear    Cannabinoid hyperemesis syndrome 01/15/2022   Herpes simplex type 1 infection    History of stomach ulcers    Preeclampsia    Uterine synechiae     Patient Active Problem List   Diagnosis Date Noted   Anxiety 10/11/2022   Chronic hypertension 10/09/2022   Marijuana abuse 01/15/2022    Past Surgical History:  Procedure Laterality Date   TOOTH EXTRACTION     TUBAL LIGATION Bilateral 07/06/2022   Procedure: POST PARTUM TUBAL LIGATION;  Surgeon: Adam Phenix, MD;  Location: [facility];  Service: Gynecology;  Laterality: Bilateral;   WISDOM TOOTH EXTRACTION     03/18/2023    OB History     Gravida  3   Para  2   Term  1   Preterm  1   AB  1   Living  2      SAB  0   IAB  1   Ectopic  0   Multiple  0   Live Births  2            Home Medications    Prior to Admission medications   Medication Sig Start Date End Date Taking? Authorizing Provider  hydrochlorothiazide (HYDRODIURIL) 25 MG tablet TAKE 1 TABLET (25 MG TOTAL) BY MOUTH DAILY. 02/04/23  Yes Alyson Reedy, FNP  ibuprofen (ADVIL) 600 MG tablet TAKE 1 TABLET BY MOUTH EVERY 6 HOURS AS NEEDED Patient taking differently: Take 800 mg by mouth every 6 (six) hours as needed. 02/24/23  Yes Reva Bores, MD  sulfamethoxazole-trimethoprim (BACTRIM DS) 800-160 MG tablet Take 1 tablet by mouth 2 (two) times daily for 7 days. 03/23/23 03/30/23 Yes Livio Ledwith, Denny Peon, MD  adapalene (DIFFERIN) 0.1 % cream Apply to skin at night 02/04/23   Terri Piedra,  DO  spironolactone (ALDACTONE) 50 MG tablet Take 3 pills at night 02/04/23   Terri Piedra, DO    Family History Family History  Problem Relation Age of Onset   Asthma Mother    Hypertension Mother    Diabetes Mother    Breast cancer Mother    Hypertension Maternal Grandmother    Diabetes Maternal Grandfather    Cancer Neg Hx    Heart disease Neg Hx     Social History Social History   Tobacco Use   Smoking status: Former    Current packs/day: 0.00    Average packs/day: 0.3 packs/day for 2.0 years (0.5 ttl pk-yrs)    Types: Cigarettes    Start date: 04/08/2009    Quit date: 04/09/2011    Years since quitting: 11.9    Passive exposure: Current   Smokeless tobacco: Never  Vaping Use   Vaping status: Never Used  Substance Use Topics   Alcohol use: No   Drug use: Not Currently    Types: Marijuana     Allergies   Patient  has no known allergies.   Review of Systems Review of Systems  Constitutional: Negative.   HENT: Negative.    Respiratory: Negative.    Cardiovascular: Negative.   Skin:  Positive for color change.     Physical Exam Triage Vital Signs ED Triage Vitals  Encounter Vitals Group     BP 03/23/23 1442 128/83     Systolic BP Percentile --      Diastolic BP Percentile --      Pulse Rate 03/23/23 1442 87     Resp 03/23/23 1442 16     Temp 03/23/23 1442 98.5 F (36.9 C)     Temp Source 03/23/23 1442 Oral     SpO2 03/23/23 1442 95 %     Weight --      Height --      Head Circumference --      Peak Flow --      Pain Score 03/23/23 1443 10     Pain Loc --      Pain Education --      Exclude from Growth Chart --    No data found.  Updated Vital Signs BP 128/83   Pulse 87   Temp 98.5 F (36.9 C) (Oral)   Resp 16   LMP 03/08/2023 (Approximate)   SpO2 95%   Breastfeeding No   Visual Acuity Right Eye Distance:   Left Eye Distance:   Bilateral Distance:    Right Eye Near:   Left Eye Near:    Bilateral Near:     Physical  Exam Constitutional:      Appearance: Normal appearance.  Cardiovascular:     Rate and Rhythm: Normal rate and regular rhythm.  Pulmonary:     Effort: Pulmonary effort is normal.     Breath sounds: Normal breath sounds.  Skin:    Comments: At the superior portion of the left axilla is a red, tender, warm lump;  no drainage noted;   lump is firm  Neurological:     General: No focal deficit present.     Mental Status: She is alert.  Psychiatric:        Mood and Affect: Mood normal.      UC Treatments / Results  Labs (all labs ordered are listed, but only abnormal results are displayed) Labs Reviewed - No data to display  EKG   Radiology No results found.  Procedures Incision and Drainage  Date/Time: 03/23/2023 3:24 PM  Performed by: Jannifer Franklin, MD Authorized by: Jannifer Franklin, MD   Consent:    Consent obtained:  Verbal   Consent given by:  Patient   Risks, benefits, and alternatives were discussed: yes     Risks discussed:  Bleeding and incomplete drainage Universal protocol:    Patient identity confirmed:  Verbally with patient Location:    Type:  Abscess   Location:  Upper extremity   Upper extremity location:  Arm   Arm location:  L upper arm Pre-procedure details:    Skin preparation:  Antiseptic wash Sedation:    Sedation type:  None Anesthesia:    Anesthesia method:  Local infiltration   Local anesthetic:  Lidocaine 1% WITH epi Procedure type:    Complexity:  Simple Procedure details:    Incision types:  Single straight   Incision depth:  Dermal   Drainage characteristics: white and thick.   Drainage amount:  Moderate   Wound treatment:  Wound left open   Packing materials:  None Post-procedure details:  Procedure completion:  Tolerated  (including critical care time)  Medications Ordered in UC Medications - No data to display  Initial Impression / Assessment and Plan / UC Course  I have reviewed the triage vital signs and the nursing  notes.  Pertinent labs & imaging results that were available during my care of the patient were reviewed by me and considered in my medical decision making (see chart for details).   Final Clinical Impressions(s) / UC Diagnoses   Final diagnoses:  Infected sebaceous cyst     Discharge Instructions      You were seen today for an infected cyst.  This was opened and drained today.  I have sent an antibiotic to your pharmacy as well. Please keep it covered while it is open and draining.  Please change the dressing accordingly.  Please return if not improving as expected.     ED Prescriptions     Medication Sig Dispense Auth. Provider   sulfamethoxazole-trimethoprim (BACTRIM DS) 800-160 MG tablet Take 1 tablet by mouth 2 (two) times daily for 7 days. 14 tablet Jannifer Franklin, MD      PDMP not reviewed this encounter.   Jannifer Franklin, MD 03/23/23 1525

## 2023-04-15 ENCOUNTER — Ambulatory Visit: Payer: Medicaid Other | Admitting: Cardiology

## 2023-04-17 ENCOUNTER — Encounter (HOSPITAL_BASED_OUTPATIENT_CLINIC_OR_DEPARTMENT_OTHER): Payer: Self-pay | Admitting: Family Medicine

## 2023-05-06 ENCOUNTER — Ambulatory Visit: Payer: Medicaid Other | Admitting: Dermatology

## 2023-06-10 ENCOUNTER — Encounter: Payer: Self-pay | Admitting: Cardiology

## 2023-07-02 ENCOUNTER — Ambulatory Visit: Payer: Medicaid Other | Admitting: Dermatology

## 2023-07-14 ENCOUNTER — Ambulatory Visit: Payer: Medicaid Other | Admitting: Cardiology

## 2023-11-14 ENCOUNTER — Encounter (HOSPITAL_COMMUNITY): Payer: Self-pay

## 2023-11-14 ENCOUNTER — Emergency Department (HOSPITAL_COMMUNITY)

## 2023-11-14 ENCOUNTER — Emergency Department (HOSPITAL_COMMUNITY)
Admission: EM | Admit: 2023-11-14 | Discharge: 2023-11-14 | Disposition: A | Discharge status: Home / Self Care | Attending: Emergency Medicine | Admitting: Emergency Medicine

## 2023-11-14 ENCOUNTER — Other Ambulatory Visit: Payer: Self-pay

## 2023-11-14 DIAGNOSIS — R519 Headache, unspecified: Secondary | ICD-10-CM | POA: Diagnosis present

## 2023-11-14 DIAGNOSIS — I10 Essential (primary) hypertension: Secondary | ICD-10-CM | POA: Diagnosis not present

## 2023-11-14 DIAGNOSIS — Z79899 Other long term (current) drug therapy: Secondary | ICD-10-CM | POA: Diagnosis not present

## 2023-11-14 LAB — CBC WITH DIFFERENTIAL/PLATELET
Abs Immature Granulocytes: 0.01 10*3/uL (ref 0.00–0.07)
Basophils Absolute: 0.1 10*3/uL (ref 0.0–0.1)
Basophils Relative: 1 %
Eosinophils Absolute: 0.1 10*3/uL (ref 0.0–0.5)
Eosinophils Relative: 2 %
HCT: 40.2 % (ref 36.0–46.0)
Hemoglobin: 13 g/dL (ref 12.0–15.0)
Immature Granulocytes: 0 %
Lymphocytes Relative: 34 %
Lymphs Abs: 2.1 10*3/uL (ref 0.7–4.0)
MCH: 30.6 pg (ref 26.0–34.0)
MCHC: 32.3 g/dL (ref 30.0–36.0)
MCV: 94.6 fL (ref 80.0–100.0)
Monocytes Absolute: 0.5 10*3/uL (ref 0.1–1.0)
Monocytes Relative: 7 %
Neutro Abs: 3.4 10*3/uL (ref 1.7–7.7)
Neutrophils Relative %: 56 %
Platelets: 425 10*3/uL — ABNORMAL HIGH (ref 150–400)
RBC: 4.25 MIL/uL (ref 3.87–5.11)
RDW: 13.7 % (ref 11.5–15.5)
WBC: 6.2 10*3/uL (ref 4.0–10.5)
nRBC: 0 % (ref 0.0–0.2)

## 2023-11-14 LAB — BASIC METABOLIC PANEL WITH GFR
Anion gap: 10 (ref 5–15)
BUN: 13 mg/dL (ref 6–20)
CO2: 26 mmol/L (ref 22–32)
Calcium: 9 mg/dL (ref 8.9–10.3)
Chloride: 106 mmol/L (ref 98–111)
Creatinine, Ser: 0.54 mg/dL (ref 0.44–1.00)
GFR, Estimated: >60 mL/min (ref >60)
Glucose, Bld: 94 mg/dL (ref 70–99)
Potassium: 3.8 mmol/L (ref 3.5–5.1)
Sodium: 142 mmol/L (ref 135–145)

## 2023-11-14 MED ORDER — PROCHLORPERAZINE EDISYLATE 10 MG/2ML IJ SOLN
10.0000 mg | Freq: Once | INTRAMUSCULAR | Status: AC
  Administered 2023-11-14: 10 mg via INTRAVENOUS
  Filled 2023-11-14: qty 2

## 2023-11-14 MED ORDER — ACETAMINOPHEN 500 MG PO TABS
1000.0000 mg | ORAL_TABLET | Freq: Once | ORAL | Status: AC
  Administered 2023-11-14: 1000 mg via ORAL
  Filled 2023-11-14: qty 2

## 2023-11-14 NOTE — ED Provider Notes (Signed)
 [place] EMERGENCY DEPARTMENT AT [facility] Provider Note  CSN: [id number] Arrival date & time: 11/14/23 0024  Chief Complaint(s) Headache  HPI Traci King is a 33 y.o. female     Headache Pain location:  R parietal and L parietal Quality:  Sharp and stabbing Onset quality:  Sudden Duration:  4 hours Timing:  Constant Chronicity:  New Relieved by:  Nothing Worsened by:  Light Associated symptoms: nausea   Associated symptoms: no congestion, no fever, no focal weakness and no vomiting     Past Medical History Past Medical History:  Diagnosis Date   Abnormal Pap smear    Cannabinoid hyperemesis syndrome 01/15/2022   Herpes simplex type 1 infection    History of stomach ulcers    Preeclampsia    Uterine synechiae    Patient Active Problem List   Diagnosis Date Noted   Anxiety 10/11/2022   Chronic hypertension 10/09/2022   Marijuana abuse 01/15/2022   Home Medication(s) Prior to Admission medications   Medication Sig Start Date End Date Taking? Authorizing Provider  adapalene  (DIFFERIN ) 0.1 % cream Apply to skin at night 02/04/23   Dellar Fenton, DO  hydrochlorothiazide  (HYDRODIURIL ) 25 MG tablet TAKE 1 TABLET (25 MG TOTAL) BY MOUTH DAILY. 02/04/23   Butler, Kristina, FNP  ibuprofen  (ADVIL ) 600 MG tablet TAKE 1 TABLET BY MOUTH EVERY 6 HOURS AS NEEDED Patient taking differently: Take 800 mg by mouth every 6 (six) hours as needed. 02/24/23   Granville Layer, MD  spironolactone  (ALDACTONE ) 50 MG tablet Take 3 pills at night 02/04/23   Dellar Fenton, DO                                                                                                                                    Allergies Patient has no known allergies.  Review of Systems Review of Systems  Constitutional:  Negative for fever.  HENT:  Negative for congestion.   Gastrointestinal:  Positive for nausea. Negative for vomiting.  Neurological:  Positive for headaches. Negative for  focal weakness.   As noted in HPI  Physical Exam Vital Signs  I have reviewed the triage vital signs BP (!) 167/90   Pulse 62   Temp (!) 96.8 F (36 C)   Resp 14   LMP 11/14/2023 (Exact Date)   SpO2 100%   Physical Exam Vitals reviewed.  Constitutional:      General: She is not in acute distress.    Appearance: She is well-developed. She is not diaphoretic.  HENT:     Head: Normocephalic and atraumatic.     Right Ear: External ear normal.     Left Ear: External ear normal.     Nose: Nose normal.   Eyes:     General: No scleral icterus.    Conjunctiva/sclera: Conjunctivae normal.   Neck:     Trachea: Phonation normal.   Cardiovascular:  Rate and Rhythm: Normal rate and regular rhythm.  Pulmonary:     Effort: Pulmonary effort is normal. No respiratory distress.     Breath sounds: No stridor.  Abdominal:     General: There is no distension.   Musculoskeletal:        General: Normal range of motion.     Cervical back: Normal range of motion.   Neurological:     Mental Status: She is alert and oriented to person, place, and time.     Cranial Nerves: Cranial nerves 2-12 are intact.     Sensory: Sensation is intact.     Motor: Motor function is intact.     Gait: Gait is intact. Gait normal.   Psychiatric:        Behavior: Behavior normal.     ED Results and Treatments Labs (all labs ordered are listed, but only abnormal results are displayed) Labs Reviewed  CBC WITH DIFFERENTIAL/PLATELET - Abnormal; Notable for the following components:      Result Value   Platelets 425 (*)    All other components within normal limits  BASIC METABOLIC PANEL WITH GFR                                                                                                                         EKG  EKG Interpretation Date/Time:    Ventricular Rate:    PR Interval:    QRS Duration:    QT Interval:    QTC Calculation:   R Axis:      Text Interpretation:          Radiology CT Head Wo Contrast Result Date: 11/14/2023 EXAM: CT HEAD WITHOUT CONTRAST 11/14/2023 03:30:11 AM TECHNIQUE: CT of the head was performed without the administration of intravenous contrast. Automated exposure control, iterative reconstruction, and/or weight based adjustment of the mA/kV was utilized to reduce the radiation dose to as low as reasonably achievable. COMPARISON: 07/10/2008 CLINICAL HISTORY: Headache, sudden, severe. Throbbing headache x4 hours with light sensitivity. Patient has history of migraines. Patient didn't take any medications prior to calling EMS. History of hypertension. Vital signs stable. Patient states she thinks her blood pressure is causing her headache. Hurts when she moves her head. FINDINGS: BRAIN AND VENTRICLES: There is no acute intracranial hemorrhage, mass effect or midline shift. No abnormal extra-axial fluid collection. The gray-white differentiation is maintained without an acute infarct. There is no hydrocephalus. ORBITS: The visualized portion of the orbits demonstrate no acute abnormality. SINUSES: The visualized paranasal sinuses and mastoid air cells demonstrate no acute abnormality. SOFT TISSUES AND SKULL: No acute abnormality of the visualized skull or soft tissues. IMPRESSION: 1. Normal head CT. Electronically signed by: Zadie Herter MD 11/14/2023 03:33 AM EDT RP Workstation: [id number]    Medications Ordered in ED Medications  prochlorperazine (COMPAZINE) injection 10 mg (10 mg Intravenous Given 11/14/23 0245)  acetaminophen  (TYLENOL ) tablet 1,000 mg (1,000 mg Oral Given 11/14/23 0246)   Procedures Procedures  (including critical care  time) Medical Decision Making / ED Course   Medical Decision Making Amount and/or Complexity of Data Reviewed Labs: ordered. Radiology: ordered.  Risk OTC drugs. Prescription drug management.    Severe headache Non focal neuro exam.  No fever. Doubt meningitis.  Doubt IIH. No recent head  trauma.  She is hypertensive with sudden headache. CT to rule ICH/SAH was reassuring. Will treat with migraine cocktail and reevaluate.  Clinical Course as of 11/14/23 0436  Fri Nov 14, 2023  0436 Pain free  [PC]    Clinical Course User Index [PC] Ammie Warrick, Camila Cecil, MD    Final Clinical Impression(s) / ED Diagnoses Final diagnoses:  Bad headache   The patient appears reasonably screened and/or stabilized for discharge and I doubt any other medical condition or other [facility] requiring further screening, evaluation, or treatment in the ED at this time. I have discussed the findings, Dx and Tx plan with the patient/family who expressed understanding and agree(s) with the plan. Discharge instructions discussed at length. The patient/family was given strict return precautions who verbalized understanding of the instructions. No further questions at time of discharge.  Disposition: Discharge  Condition: Good  ED Discharge Orders     None      Follow Up: Wilhelmena Hanson, FNP [address] [place] [address] [phone]  Call  to schedule an appointment for close follow up    This chart was dictated using voice recognition software.  Despite best efforts to proofread,  errors can occur which can change the documentation meaning.    Lindle Rhea, MD 11/14/23 [phone]

## 2023-11-14 NOTE — ED Triage Notes (Addendum)
 Pt BIB [facility] for throbbing headache x4 hours with light sensitivity. Pt has hx of migraines. Pt didn't take any medications prior to calling EMS.  Hx of HTN. VSS Pt states she thinks her BP is causing her headache. Hurts when she moves her head.

## 2023-12-12 ENCOUNTER — Ambulatory Visit
Admission: EM | Admit: 2023-12-12 | Discharge: 2023-12-12 | Disposition: A | Discharge status: Home / Self Care | Attending: Nurse Practitioner | Admitting: Nurse Practitioner

## 2023-12-12 DIAGNOSIS — L02213 Cutaneous abscess of chest wall: Secondary | ICD-10-CM

## 2023-12-12 MED ORDER — IBUPROFEN 800 MG PO TABS: 800.0000 mg | ORAL_TABLET | Freq: Three times a day (TID) | ORAL | 0 refills | Status: AC | PRN

## 2023-12-12 MED ORDER — SULFAMETHOXAZOLE-TRIMETHOPRIM 800-160 MG PO TABS: 1.0000 | ORAL_TABLET | Freq: Two times a day (BID) | ORAL | 0 refills | Status: AC

## 2023-12-12 NOTE — ED Provider Notes (Signed)
 [facility]    CSN: [id number] Arrival date & time: 12/12/23  1014      History   Chief Complaint Chief Complaint  Patient presents with   Abscess    HPI Traci King is a 33 y.o. female.   Discussed the use of AI scribe software for clinical note transcription with the patient, who gave verbal consent to proceed.   The patient presents with a painful abscess that has been progressively worsening. The patient reports taking hot showers last night and this morning in an attempt to bring the abscess to a head, but it has since become throbbing and more painful. The patient describes body pain starting from the site of the abscess. She denies having diabetes, fevers, or chills. The abscess has not ruptured or drained on its own. The patient has not attempted to lance or drain the abscess herself.   The following portions of the patient's history were reviewed and updated as appropriate: allergies, current medications, past family history, past medical history, past social history, past surgical history, and problem list.    Past Medical History:  Diagnosis Date   Abnormal Pap smear    Cannabinoid hyperemesis syndrome 01/15/2022   Herpes simplex type 1 infection    History of stomach ulcers    Preeclampsia    Uterine synechiae     Patient Active Problem List   Diagnosis Date Noted   Anxiety 10/11/2022   Chronic hypertension 10/09/2022   Marijuana abuse 01/15/2022    Past Surgical History:  Procedure Laterality Date   TOOTH EXTRACTION     TUBAL LIGATION Bilateral 07/06/2022   Procedure: POST PARTUM TUBAL LIGATION;  Surgeon: Eveline Lynwood MATSU, MD;  Location: [facility];  Service: Gynecology;  Laterality: Bilateral;   WISDOM TOOTH EXTRACTION     03/18/2023    OB History     Gravida  3   Para  2   Term  1   Preterm  1   AB  1   Living  2      SAB  0   IAB  1   Ectopic  0   Multiple  0   Live Births  2            Home  Medications    Prior to Admission medications   Medication Sig Start Date End Date Taking? Authorizing Provider  ibuprofen  (ADVIL ) 800 MG tablet Take 1 tablet (800 mg total) by mouth every 8 (eight) hours as needed (pain). Do not take any additional over-the-counter NSAIDs (such as Advil , Aleve , or other ibuprofen /naproxen  products) while using this medication. 12/12/23  Yes Rahman Ferrall, FNP  sulfamethoxazole -trimethoprim  (BACTRIM  DS) 800-160 MG tablet Take 1 tablet by mouth 2 (two) times daily for 7 days. 12/12/23 12/19/23 Yes Iola Lukes, FNP  adapalene  (DIFFERIN ) 0.1 % cream Apply to skin at night 02/04/23   Alm Delon SAILOR, DO  hydrochlorothiazide  (HYDRODIURIL ) 25 MG tablet TAKE 1 TABLET (25 MG TOTAL) BY MOUTH DAILY. 02/04/23   Towana Small, FNP  spironolactone  (ALDACTONE ) 50 MG tablet Take 3 pills at night 02/04/23   Alm Delon SAILOR, DO    Family History Family History  Problem Relation Age of Onset   Asthma Mother    Hypertension Mother    Diabetes Mother    Breast cancer Mother    Hypertension Maternal Grandmother    Diabetes Maternal Grandfather    Cancer Neg Hx    Heart disease Neg Hx  Social History Social History   Tobacco Use   Smoking status: Former    Current packs/day: 0.00    Average packs/day: 0.3 packs/day for 2.0 years (0.5 ttl pk-yrs)    Types: Cigarettes    Start date: 04/08/2009    Quit date: 04/09/2011    Years since quitting: 12.6    Passive exposure: Current   Smokeless tobacco: Never  Vaping Use   Vaping status: Never Used  Substance Use Topics   Alcohol use: No   Drug use: Not Currently    Types: Marijuana     Allergies   Patient has no known allergies.   Review of Systems Review of Systems  Constitutional:  Negative for fever.  Skin:  Positive for wound.  All other systems reviewed and are negative.    Physical Exam Triage Vital Signs ED Triage Vitals  Encounter Vitals Group     BP 12/12/23 1029 (!) 153/103      Girls Systolic BP Percentile --      Girls Diastolic BP Percentile --      Boys Systolic BP Percentile --      Boys Diastolic BP Percentile --      Pulse Rate 12/12/23 1029 90     Resp 12/12/23 1029 17     Temp 12/12/23 1029 98.1 F (36.7 C)     Temp Source 12/12/23 1029 Oral     SpO2 12/12/23 1029 98 %     Weight --      Height --      Head Circumference --      Peak Flow --      Pain Score 12/12/23 1032 10     Pain Loc --      Pain Education --      Exclude from Growth Chart --    No data found.  Updated Vital Signs BP (!) 153/103 (BP Location: Right Arm) Comment: Pt not taking BP meds  Pulse 90   Temp 98.1 F (36.7 C) (Oral)   Resp 17   LMP 12/03/2023 (Exact Date)   SpO2 98%   Breastfeeding No   Visual Acuity Right Eye Distance:   Left Eye Distance:   Bilateral Distance:    Right Eye Near:   Left Eye Near:    Bilateral Near:     Physical Exam Vitals reviewed.  Constitutional:      General: She is awake. She is not in acute distress.    Appearance: Normal appearance. She is well-developed. She is not ill-appearing, toxic-appearing or diaphoretic.  HENT:     Head: Normocephalic.     Right Ear: Hearing normal.     Left Ear: Hearing normal.     Nose: Nose normal.     Mouth/Throat:     Mouth: Mucous membranes are moist.  Eyes:     General: Vision grossly intact.     Conjunctiva/sclera: Conjunctivae normal.  Cardiovascular:     Rate and Rhythm: Normal rate and regular rhythm.     Heart sounds: Normal heart sounds.  Pulmonary:     Effort: Pulmonary effort is normal.     Breath sounds: Normal breath sounds and air entry.  Chest:       Comments: Erythematous, tender, firm, and warm abscess located on the right anterior chest wall, just inferior to the right breast. No underlying fluctuance is appreciated on palpation. No drainage or open skin noted.  Musculoskeletal:        General: Normal range of motion.  Cervical back: Full passive range of motion  without pain, normal range of motion and neck supple.  Skin:    General: Skin is warm and dry.     Findings: Abscess present.     Comments: See above for description and image below   Neurological:     General: No focal deficit present.     Mental Status: She is alert and oriented to person, place, and time.  Psychiatric:        Speech: Speech normal.        Behavior: Behavior is cooperative.      UC Treatments / Results  Labs (all labs ordered are listed, but only abnormal results are displayed) Labs Reviewed - No data to display  EKG   Radiology No results found.  Procedures Procedures (including critical care time)  Medications Ordered in UC Medications - No data to display  Initial Impression / Assessment and Plan / UC Course  I have reviewed the triage vital signs and the nursing notes.  Pertinent labs & imaging results that were available during my care of the patient were reviewed by me and considered in my medical decision making (see chart for details).     Patient presents with a painful, throbbing abscess on the right anterior chest wall. On examination, the abscess is tender, erythematous, and warm, without fluctuance, making incision and drainage not indicated at this time. The patient is afebrile, reports no systemic symptoms, and denies a history of diabetes. Bactrim  DS was prescribed twice daily for 7 days, along with ibuprofen  as needed for pain. The patient was instructed to avoid additional NSAIDs while taking ibuprofen . Education was provided on applying moist heat several times a day using a warm, wet washcloth or a heating pad set on low over a damp cloth. The patient was advised not to manipulate or squeeze the area. A follow-up visit is scheduled for Sunday to reassess the abscess and determine if lancing is needed. ED evaluation is advised if the patient develops fever, spreading redness, increasing pain, or drainage before the follow-up  visit.  Today's evaluation has revealed no signs of a dangerous process. Discussed diagnosis with patient and/or guardian. Patient and/or guardian aware of their diagnosis, possible red flag symptoms to watch out for and need for close follow up. Patient and/or guardian understands verbal and written discharge instructions. Patient and/or guardian comfortable with plan and disposition.  Patient and/or guardian has a clear mental status at this time, good insight into illness (after discussion and teaching) and has clear judgment to make decisions regarding their care  Documentation was completed with the aid of voice recognition software. Transcription may contain typographical errors. Final Clinical Impressions(s) / UC Diagnoses   Final diagnoses:  Cutaneous abscess of chest wall     Discharge Instructions      You were evaluated today for a painful abscess on the right side of your chest, just below the breast. At this time, there is no fluid pocket that can be drained, so an incision and drainage procedure is not needed. You were prescribed Bactrim  DS to be taken twice daily for 7 days. Be sure to complete the full course, even if your symptoms begin to improve. For pain, you may take ibuprofen  as needed, but avoid using other over-the-counter anti-inflammatory medications at the same time.  To help reduce swelling and promote healing, apply moist heat to the area several times a day. This can be done using a wet washcloth warmed in  the microwave or a heating pad set on low placed over a damp cloth. Avoid squeezing or trying to pop the abscess, as this can worsen inflammation or lead to infection.  Follow-up here on Sunday to reassess the area and determine if drainage is needed at that time. If you develop a fever, chills, worsening redness or swelling, increasing pain, or drainage from the area before then, seek medical care promptly.      ED Prescriptions     Medication Sig Dispense  Auth. Provider   sulfamethoxazole -trimethoprim  (BACTRIM  DS) 800-160 MG tablet Take 1 tablet by mouth 2 (two) times daily for 7 days. 14 tablet Isabel Ardila, Tripp, FNP   ibuprofen  (ADVIL ) 800 MG tablet Take 1 tablet (800 mg total) by mouth every 8 (eight) hours as needed (pain). Do not take any additional over-the-counter NSAIDs (such as Advil , Aleve , or other ibuprofen /naproxen  products) while using this medication. 21 tablet Iola Lukes, FNP      PDMP not reviewed this encounter.   Iola Lukes, [place] 12/12/23 1113

## 2023-12-12 NOTE — Discharge Instructions (Addendum)
 You were evaluated today for a painful abscess on the right side of your chest, just below the breast. At this time, there is no fluid pocket that can be drained, so an incision and drainage procedure is not needed. You were prescribed Bactrim  DS to be taken twice daily for 7 days. Be sure to complete the full course, even if your symptoms begin to improve. For pain, you may take ibuprofen  as needed, but avoid using other over-the-counter anti-inflammatory medications at the same time.  To help reduce swelling and promote healing, apply moist heat to the area several times a day. This can be done using a wet washcloth warmed in the microwave or a heating pad set on low placed over a damp cloth. Avoid squeezing or trying to pop the abscess, as this can worsen inflammation or lead to infection.  Follow-up here on Sunday to reassess the area and determine if drainage is needed at that time. If you develop a fever, chills, worsening redness or swelling, increasing pain, or drainage from the area before then, seek medical care promptly.

## 2023-12-12 NOTE — ED Triage Notes (Signed)
 Pt c/o abscess under right breast for a few days.

## 2023-12-14 ENCOUNTER — Ambulatory Visit

## 2024-03-16 ENCOUNTER — Ambulatory Visit: Admitting: Obstetrics and Gynecology
